# Patient Record
Sex: Female | Born: 1950 | Race: White | Hispanic: No | Smoking: Never smoker
Health system: Southern US, Community
[De-identification: ages and names within clinical notes are randomized; demographics above are authoritative.]

## PROBLEM LIST (undated history)

## (undated) DIAGNOSIS — I509 Heart failure, unspecified: Secondary | ICD-10-CM

## (undated) DIAGNOSIS — C189 Malignant neoplasm of colon, unspecified: Secondary | ICD-10-CM

## (undated) DIAGNOSIS — R079 Chest pain, unspecified: Secondary | ICD-10-CM

## (undated) DIAGNOSIS — I639 Cerebral infarction, unspecified: Secondary | ICD-10-CM

## (undated) DIAGNOSIS — D3A00 Benign carcinoid tumor of unspecified site: Secondary | ICD-10-CM

## (undated) DIAGNOSIS — I219 Acute myocardial infarction, unspecified: Secondary | ICD-10-CM

## (undated) DIAGNOSIS — I2510 Atherosclerotic heart disease of native coronary artery without angina pectoris: Secondary | ICD-10-CM

## (undated) DIAGNOSIS — I4891 Unspecified atrial fibrillation: Secondary | ICD-10-CM

## (undated) DIAGNOSIS — I10 Essential (primary) hypertension: Secondary | ICD-10-CM

## (undated) DIAGNOSIS — E119 Type 2 diabetes mellitus without complications: Secondary | ICD-10-CM

## (undated) DIAGNOSIS — J811 Chronic pulmonary edema: Secondary | ICD-10-CM

## (undated) HISTORY — PX: CHOLECYSTECTOMY: SHX55

## (undated) HISTORY — DX: Malignant neoplasm of colon, unspecified: C18.9

## (undated) HISTORY — DX: Benign carcinoid tumor of unspecified site: D3A.00

## (undated) HISTORY — DX: Unspecified atrial fibrillation: I48.91

## (undated) HISTORY — DX: Heart failure, unspecified: I50.9

## (undated) HISTORY — DX: Atherosclerotic heart disease of native coronary artery without angina pectoris: I25.10

## (undated) HISTORY — DX: Chronic pulmonary edema: J81.1

## (undated) HISTORY — DX: Acute myocardial infarction, unspecified: I21.9

## (undated) HISTORY — DX: Cerebral infarction, unspecified: I63.9

## (undated) HISTORY — DX: Essential (primary) hypertension: I10

## (undated) HISTORY — DX: Type 2 diabetes mellitus without complications: E11.9

## (undated) HISTORY — DX: Chest pain, unspecified: R07.9

## (undated) HISTORY — PX: TUBAL LIGATION: SHX77

## (undated) HISTORY — PX: SHOULDER SURGERY: SHX246

---

## 2006-08-07 DIAGNOSIS — C189 Malignant neoplasm of colon, unspecified: Secondary | ICD-10-CM

## 2006-08-07 HISTORY — DX: Malignant neoplasm of colon, unspecified: C18.9

## 2008-08-07 DIAGNOSIS — I639 Cerebral infarction, unspecified: Secondary | ICD-10-CM

## 2008-08-07 HISTORY — DX: Cerebral infarction, unspecified: I63.9

## 2008-08-07 HISTORY — PX: CARDIAC CATHETERIZATION: SHX172

## 2008-08-09 ENCOUNTER — Ambulatory Visit: Payer: Self-pay | Admitting: Cardiology

## 2008-08-13 ENCOUNTER — Inpatient Hospital Stay (HOSPITAL_BASED_OUTPATIENT_CLINIC_OR_DEPARTMENT_OTHER): Admission: RE | Admit: 2008-08-13 | Discharge: 2008-08-13 | Payer: Self-pay | Admitting: Cardiology

## 2008-08-13 ENCOUNTER — Ambulatory Visit: Payer: Self-pay | Admitting: Cardiology

## 2008-09-14 ENCOUNTER — Ambulatory Visit: Payer: Self-pay | Admitting: Cardiology

## 2008-10-22 ENCOUNTER — Encounter: Payer: Self-pay | Admitting: Cardiovascular Disease

## 2008-10-22 ENCOUNTER — Ambulatory Visit: Payer: Self-pay | Admitting: Cardiology

## 2008-10-23 ENCOUNTER — Encounter: Payer: Self-pay | Admitting: Cardiovascular Disease

## 2008-10-28 ENCOUNTER — Ambulatory Visit: Payer: Self-pay | Admitting: Physical Medicine & Rehabilitation

## 2008-10-28 ENCOUNTER — Inpatient Hospital Stay (HOSPITAL_COMMUNITY)
Admission: RE | Admit: 2008-10-28 | Discharge: 2008-11-18 | Payer: Self-pay | Admitting: Physical Medicine & Rehabilitation

## 2008-12-18 ENCOUNTER — Encounter
Admission: RE | Admit: 2008-12-18 | Discharge: 2009-03-18 | Payer: Self-pay | Admitting: Physical Medicine & Rehabilitation

## 2008-12-22 ENCOUNTER — Ambulatory Visit: Payer: Self-pay | Admitting: Physical Medicine & Rehabilitation

## 2009-01-25 ENCOUNTER — Ambulatory Visit: Payer: Self-pay | Admitting: Physical Medicine & Rehabilitation

## 2009-02-02 ENCOUNTER — Ambulatory Visit: Payer: Self-pay | Admitting: Cardiology

## 2009-02-16 ENCOUNTER — Ambulatory Visit: Payer: Self-pay | Admitting: Physical Medicine & Rehabilitation

## 2009-03-01 ENCOUNTER — Ambulatory Visit: Payer: Self-pay | Admitting: Cardiology

## 2009-03-09 ENCOUNTER — Encounter: Payer: Self-pay | Admitting: Cardiology

## 2009-03-12 ENCOUNTER — Encounter: Payer: Self-pay | Admitting: Cardiology

## 2009-03-25 ENCOUNTER — Encounter
Admission: RE | Admit: 2009-03-25 | Discharge: 2009-06-23 | Payer: Self-pay | Admitting: Physical Medicine & Rehabilitation

## 2009-03-30 ENCOUNTER — Ambulatory Visit: Payer: Self-pay | Admitting: Physical Medicine & Rehabilitation

## 2009-04-19 ENCOUNTER — Ambulatory Visit: Payer: Self-pay | Admitting: Physical Medicine & Rehabilitation

## 2009-04-30 DIAGNOSIS — E785 Hyperlipidemia, unspecified: Secondary | ICD-10-CM | POA: Insufficient documentation

## 2009-04-30 DIAGNOSIS — R0989 Other specified symptoms and signs involving the circulatory and respiratory systems: Secondary | ICD-10-CM | POA: Insufficient documentation

## 2009-04-30 DIAGNOSIS — I2510 Atherosclerotic heart disease of native coronary artery without angina pectoris: Secondary | ICD-10-CM | POA: Insufficient documentation

## 2009-04-30 DIAGNOSIS — I10 Essential (primary) hypertension: Secondary | ICD-10-CM | POA: Insufficient documentation

## 2009-04-30 DIAGNOSIS — E663 Overweight: Secondary | ICD-10-CM | POA: Insufficient documentation

## 2009-05-17 ENCOUNTER — Ambulatory Visit: Payer: Self-pay | Admitting: Physical Medicine & Rehabilitation

## 2009-06-14 ENCOUNTER — Ambulatory Visit: Payer: Self-pay | Admitting: Physical Medicine & Rehabilitation

## 2009-07-09 ENCOUNTER — Encounter
Admission: RE | Admit: 2009-07-09 | Discharge: 2009-07-29 | Payer: Self-pay | Admitting: Physical Medicine & Rehabilitation

## 2009-07-12 ENCOUNTER — Ambulatory Visit: Payer: Self-pay | Admitting: Physical Medicine & Rehabilitation

## 2009-09-08 ENCOUNTER — Encounter
Admission: RE | Admit: 2009-09-08 | Discharge: 2009-12-07 | Payer: Self-pay | Admitting: Physical Medicine & Rehabilitation

## 2009-09-10 ENCOUNTER — Ambulatory Visit: Payer: Self-pay | Admitting: Physical Medicine & Rehabilitation

## 2009-10-04 ENCOUNTER — Ambulatory Visit: Payer: Self-pay | Admitting: Physical Medicine & Rehabilitation

## 2009-11-02 ENCOUNTER — Ambulatory Visit: Payer: Self-pay | Admitting: Physical Medicine & Rehabilitation

## 2009-12-23 ENCOUNTER — Encounter
Admission: RE | Admit: 2009-12-23 | Discharge: 2010-03-23 | Payer: Self-pay | Admitting: Physical Medicine & Rehabilitation

## 2009-12-28 ENCOUNTER — Ambulatory Visit: Payer: Self-pay | Admitting: Physical Medicine & Rehabilitation

## 2010-01-04 ENCOUNTER — Ambulatory Visit: Payer: Self-pay | Admitting: Physical Medicine & Rehabilitation

## 2010-01-27 ENCOUNTER — Ambulatory Visit: Payer: Self-pay | Admitting: Physical Medicine & Rehabilitation

## 2010-03-01 ENCOUNTER — Ambulatory Visit: Payer: Self-pay | Admitting: Physical Medicine & Rehabilitation

## 2010-04-12 ENCOUNTER — Encounter
Admission: RE | Admit: 2010-04-12 | Discharge: 2010-07-11 | Payer: Self-pay | Source: Home / Self Care | Admitting: Physical Medicine & Rehabilitation

## 2010-04-15 ENCOUNTER — Ambulatory Visit: Payer: Self-pay | Admitting: Physical Medicine & Rehabilitation

## 2010-06-10 ENCOUNTER — Ambulatory Visit: Payer: Self-pay | Admitting: Physical Medicine & Rehabilitation

## 2010-06-28 ENCOUNTER — Ambulatory Visit: Payer: Self-pay | Admitting: Physical Medicine & Rehabilitation

## 2010-10-25 ENCOUNTER — Ambulatory Visit: Payer: Self-pay | Admitting: Physical Medicine & Rehabilitation

## 2010-11-16 LAB — GLUCOSE, CAPILLARY
Glucose-Capillary: 102 mg/dL — ABNORMAL HIGH (ref 70–99)
Glucose-Capillary: 109 mg/dL — ABNORMAL HIGH (ref 70–99)
Glucose-Capillary: 109 mg/dL — ABNORMAL HIGH (ref 70–99)
Glucose-Capillary: 115 mg/dL — ABNORMAL HIGH (ref 70–99)
Glucose-Capillary: 120 mg/dL — ABNORMAL HIGH (ref 70–99)
Glucose-Capillary: 124 mg/dL — ABNORMAL HIGH (ref 70–99)
Glucose-Capillary: 127 mg/dL — ABNORMAL HIGH (ref 70–99)
Glucose-Capillary: 129 mg/dL — ABNORMAL HIGH (ref 70–99)
Glucose-Capillary: 132 mg/dL — ABNORMAL HIGH (ref 70–99)
Glucose-Capillary: 134 mg/dL — ABNORMAL HIGH (ref 70–99)
Glucose-Capillary: 137 mg/dL — ABNORMAL HIGH (ref 70–99)
Glucose-Capillary: 137 mg/dL — ABNORMAL HIGH (ref 70–99)
Glucose-Capillary: 142 mg/dL — ABNORMAL HIGH (ref 70–99)
Glucose-Capillary: 143 mg/dL — ABNORMAL HIGH (ref 70–99)
Glucose-Capillary: 146 mg/dL — ABNORMAL HIGH (ref 70–99)
Glucose-Capillary: 154 mg/dL — ABNORMAL HIGH (ref 70–99)
Glucose-Capillary: 159 mg/dL — ABNORMAL HIGH (ref 70–99)
Glucose-Capillary: 161 mg/dL — ABNORMAL HIGH (ref 70–99)
Glucose-Capillary: 163 mg/dL — ABNORMAL HIGH (ref 70–99)
Glucose-Capillary: 164 mg/dL — ABNORMAL HIGH (ref 70–99)
Glucose-Capillary: 172 mg/dL — ABNORMAL HIGH (ref 70–99)
Glucose-Capillary: 194 mg/dL — ABNORMAL HIGH (ref 70–99)
Glucose-Capillary: 211 mg/dL — ABNORMAL HIGH (ref 70–99)
Glucose-Capillary: 226 mg/dL — ABNORMAL HIGH (ref 70–99)
Glucose-Capillary: 235 mg/dL — ABNORMAL HIGH (ref 70–99)
Glucose-Capillary: 60 mg/dL — ABNORMAL LOW (ref 70–99)
Glucose-Capillary: 68 mg/dL — ABNORMAL LOW (ref 70–99)
Glucose-Capillary: 77 mg/dL (ref 70–99)
Glucose-Capillary: 79 mg/dL (ref 70–99)
Glucose-Capillary: 87 mg/dL (ref 70–99)

## 2010-11-16 LAB — BASIC METABOLIC PANEL
BUN: 15 mg/dL (ref 6–23)
BUN: 23 mg/dL (ref 6–23)
Calcium: 8.8 mg/dL (ref 8.4–10.5)
Chloride: 104 mEq/L (ref 96–112)
Creatinine, Ser: 1.24 mg/dL — ABNORMAL HIGH (ref 0.4–1.2)
GFR calc non Af Amer: 45 mL/min — ABNORMAL LOW (ref >60)
Glucose, Bld: 151 mg/dL — ABNORMAL HIGH (ref 70–99)
Glucose, Bld: 69 mg/dL — ABNORMAL LOW (ref 70–99)
Potassium: 4.3 mEq/L (ref 3.5–5.1)
Potassium: 4.5 mEq/L (ref 3.5–5.1)

## 2010-11-16 LAB — URINE CULTURE: Colony Count: >=100000

## 2010-11-16 LAB — URINALYSIS, ROUTINE W REFLEX MICROSCOPIC
Ketones, ur: 15 mg/dL — AB
Nitrite: POSITIVE — AB
Protein, ur: 30 mg/dL — AB
Urobilinogen, UA: 0.2 mg/dL (ref 0.0–1.0)

## 2010-11-16 LAB — CLOSTRIDIUM DIFFICILE EIA: C difficile Toxins A+B, EIA: NEGATIVE

## 2010-11-17 LAB — CBC
HCT: 34.8 % — ABNORMAL LOW (ref 36.0–46.0)
MCV: 79.1 fL (ref 78.0–100.0)
RBC: 4.4 MIL/uL (ref 3.87–5.11)
WBC: 5.2 10*3/uL (ref 4.0–10.5)

## 2010-11-17 LAB — COMPREHENSIVE METABOLIC PANEL
AST: 30 U/L (ref 0–37)
BUN: 20 mg/dL (ref 6–23)
CO2: 24 mEq/L (ref 19–32)
Chloride: 101 mEq/L (ref 96–112)
Creatinine, Ser: 0.98 mg/dL (ref 0.4–1.2)
GFR calc Af Amer: >60 mL/min (ref >60)
GFR calc non Af Amer: 59 mL/min — ABNORMAL LOW (ref >60)
Total Bilirubin: 1.1 mg/dL (ref 0.3–1.2)

## 2010-11-17 LAB — DIFFERENTIAL
Basophils Absolute: 0 10*3/uL (ref 0.0–0.1)
Basophils Relative: 0 % (ref 0–1)
Eosinophils Relative: 4 % (ref 0–5)
Lymphocytes Relative: 18 % (ref 12–46)

## 2010-11-17 LAB — GLUCOSE, CAPILLARY
Glucose-Capillary: 157 mg/dL — ABNORMAL HIGH (ref 70–99)
Glucose-Capillary: 192 mg/dL — ABNORMAL HIGH (ref 70–99)
Glucose-Capillary: 208 mg/dL — ABNORMAL HIGH (ref 70–99)
Glucose-Capillary: 217 mg/dL — ABNORMAL HIGH (ref 70–99)
Glucose-Capillary: 219 mg/dL — ABNORMAL HIGH (ref 70–99)
Glucose-Capillary: 227 mg/dL — ABNORMAL HIGH (ref 70–99)
Glucose-Capillary: 227 mg/dL — ABNORMAL HIGH (ref 70–99)
Glucose-Capillary: 228 mg/dL — ABNORMAL HIGH (ref 70–99)
Glucose-Capillary: 241 mg/dL — ABNORMAL HIGH (ref 70–99)
Glucose-Capillary: 244 mg/dL — ABNORMAL HIGH (ref 70–99)
Glucose-Capillary: 245 mg/dL — ABNORMAL HIGH (ref 70–99)
Glucose-Capillary: 259 mg/dL — ABNORMAL HIGH (ref 70–99)
Glucose-Capillary: 267 mg/dL — ABNORMAL HIGH (ref 70–99)
Glucose-Capillary: 309 mg/dL — ABNORMAL HIGH (ref 70–99)

## 2010-11-17 LAB — URINE CULTURE: Special Requests: NEGATIVE

## 2010-11-17 LAB — URINALYSIS, ROUTINE W REFLEX MICROSCOPIC
Hgb urine dipstick: NEGATIVE
Specific Gravity, Urine: 1.025 (ref 1.005–1.030)
Urobilinogen, UA: 0.2 mg/dL (ref 0.0–1.0)

## 2010-11-17 LAB — URINE MICROSCOPIC-ADD ON

## 2010-11-21 LAB — POCT I-STAT GLUCOSE
Glucose, Bld: 166 mg/dL — ABNORMAL HIGH (ref 70–99)
Operator id: 141321

## 2010-11-21 LAB — BASIC METABOLIC PANEL
GFR calc Af Amer: >60 mL/min (ref >60)
GFR calc non Af Amer: 55 mL/min — ABNORMAL LOW (ref >60)
Potassium: 4.2 mEq/L (ref 3.5–5.1)
Sodium: 139 mEq/L (ref 135–145)

## 2010-11-21 LAB — PROTIME-INR: Prothrombin Time: 14.1 seconds (ref 11.6–15.2)

## 2010-11-22 ENCOUNTER — Ambulatory Visit: Payer: Self-pay | Admitting: Physical Medicine & Rehabilitation

## 2010-11-25 ENCOUNTER — Encounter: Payer: Self-pay | Attending: Physical Medicine & Rehabilitation

## 2010-11-25 ENCOUNTER — Ambulatory Visit (HOSPITAL_BASED_OUTPATIENT_CLINIC_OR_DEPARTMENT_OTHER): Payer: Self-pay | Admitting: Physical Medicine & Rehabilitation

## 2010-11-25 DIAGNOSIS — Z79899 Other long term (current) drug therapy: Secondary | ICD-10-CM | POA: Insufficient documentation

## 2010-11-25 DIAGNOSIS — G8110 Spastic hemiplegia affecting unspecified side: Secondary | ICD-10-CM

## 2010-11-25 DIAGNOSIS — I69959 Hemiplegia and hemiparesis following unspecified cerebrovascular disease affecting unspecified side: Secondary | ICD-10-CM | POA: Insufficient documentation

## 2010-11-26 NOTE — Assessment & Plan Note (Signed)
REASON FOR VISIT:  Right upper extremity spasticity.  [name] follows up today.  She had a right upper extremity Botox injection on June 11, 2010.  I last saw her on June 28, 2010.  She is on Zanaflex at night 12 mg and 2 mg twice a day during the daytime hours.  This is to avoid sedation.  She takes tramadol 50 mg at night.  She has her medications refilled and refills.  She has had no new medical problems.  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  She states that she will be on Medicare starting in October of this year.  She has had no other medical problems in the interval time.  REVIEW OF SYSTEMS:  Positive for low blood sugars as well as constipation.  PHYSICAL EXAMINATION:  VITAL SIGNS:  Blood pressure 179/50, pulse 65, respirations 18, and O2 sat 99% on room air. GENERAL:  No acute distress.  Mood and affect appropriate.  She continues to see Dr. Gerhard Munch, which is her primary care physician.  She is on Januvia, Plavix, Zocor, Nexium, Travatan eyedrops, lisinopril, and Lasix.  PHYSICAL EXAMINATION:  She has Ashworth grade 3 spasticity in the finger, wrist flexors on the right side.  She does have a hyperactive extensor hallucis brevis muscle on the left side as well.  She has 3- strength in the deltoid biceps, triceps, and grip.  This is tone influence with increased flexor tone in general in the upper extremity. Right lower extremity has a foot drop. It is correct with the AFO.  Her gait with the AFO shows no evidence of toe drag or knee instability. She does have a valgus deformity of the left knee, but no pain.  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  She would benefit from Botox injection as prior one has worn off.  However, she has no insurance at the current time.  We will need to continue the oral medications, Zanaflex.  Have been  recommended thumb spica splint on the right side.  I will see her back in 6 months when she has insurance and do Botox injection at that time.  I discussed with patient, agrees with plan.  I did offer to check for free samples on the Botox, but she states that even the injection would be costly for her.     Erick Colace, M.D. Electronically Signed    AEK/MedQ D:  11/25/2010 11:09:13  T:  11/25/2010 23:42:58  Job #:  [id number]

## 2010-12-20 NOTE — Cardiovascular Report (Signed)
NAMEALIVIANA, BURDELL NO.:  [id number]   MEDICAL RECORD NO.:  [id number]          PATIENT TYPE:  OIB   LOCATION:  1962                         FACILITY:  [facility]   PHYSICIAN:  Arturo Morton. Riley Kill, MD, FACCDATE OF BIRTH:  1951/05/20   DATE OF PROCEDURE:  08/13/2008  DATE OF DISCHARGE:  08/13/2008                            CARDIAC CATHETERIZATION   INDICATIONS:  Erica [name] is a delightful 60 year old lady who presents  with chest discomfort.  She initially had some nausea and vomiting and  then developed chest, upper arm, and jaw discomfort.  She was mildly  anemic.  A 2-D echocardiogram was done and demonstrated a small secundum  ASD and/or PFO, but Dr. Andee Lineman reviewed this study and did not feel that  the size of the right heart was enlarged and therefore right heart cath  was not recommended.  The patient underwent exercise stress testing.  There is a hypertensive blood pressure response with no chest pain and  no clear diagnostic ST-segment abnormalities.  The patient was sent down  for a diagnostic catheterization.  She did have mild elevation in her  creatinine initially, but today, the creatinine is 1.04.  On  radionuclide imaging, she had a partially reversible anterior defect  suggestive of LAD disease.  A cardiac cath was then recommended.   PROCEDURES:  1. Left heart catheterization.  2. Selective coronary arteriography.   DESCRIPTION OF THE PROCEDURE:  Through an anterior puncture, the right  femoral artery was easily entered.  A 4-French sheath was placed  following informed consent.  The 4-French diagnostic catheters were  utilized to opacify the left and right coronary arteries.  We then  performed standard coronary arteriography with limited contrast.  Ventriculography was not performed limit contrast load given her mild  creatinine elevation.   The procedure was completed without complication.  She was taken to the  holding area in satisfactory  clinical condition.   HEMODYNAMIC DATA:  The central aortic pressure was 143/63.  The mean of  94.  The left ventricular pressure 144/21.  There was no gradient  pullback across the aortic valve.   ANGIOGRAPHIC DATA:  1. The left main was free of critical disease.  2. The LAD demonstrates calcification proximally after the diagonal,      and then segmental plaque throughout the proximal LAD of      approximately 60%.  The vessel then opens up and then demonstrates      a bit more disease and it has the appearance of a typical diabetic      vessel being diffusely diseased and plaque distally.  The true      large caliber the vessel is never clearly discerned.  Likewise, the      first diagonal is a modestly large vessel supplying the      anterolateral segment.  It is segmentally diseased of about 70%      proximally.  This looks like the most severe disease clearly      compared to the LAD.  It also has a pruned appearance.  3. The circumflex  vessel is a large-caliber vessel.  It demonstrates      mild luminal irregularities with 20 and 30% areas of plaquing      throughout the proximal vessel.  Very distally, the vessel is      pruned and there is clearly plaque as it bifurcates distally of      about 50%.  None of this appears to be critical.  4. The right coronary artery demonstrates mild luminal irregularity      with about 20-30% mid narrowing.  The PDA and posterolateral      branches are intact.   CONCLUSIONS:  1. 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  2. Moderate disease of the mid LAD.  3. Segmental disease of the diagonal proximally over a fairly long      distance with pruning of the vessels distally.   DISPOSITION:  At the present time, the 2 options would likely be either  medical therapy or percutaneous intervention.  The vessels themselves  are diffusely diseased and not critical, and leaning would be in the  direction of medical therapy.  I  will review the films with my  colleagues before making a final decision.      Arturo Morton. Riley Kill, MD, [facility]  Electronically Signed     TDS/MEDQ  D:  08/13/2008  T:  08/14/2008  Job:  [id number]   cc:   Learta Codding, MD,FACC  [facility]

## 2010-12-20 NOTE — Assessment & Plan Note (Signed)
[facility]                          [facility] OFFICE NOTE   NAME:Erica [name], Erica [name]                        MRN:          [id number]  DATE:09/14/2008                            DOB:          1951-03-13    PRIMARY CARDIOLOGIST:  Learta Codding, MD, [facility]   REASON FOR VISIT:  Postcatheterization followup.   Erica [name] presents to our clinic after undergoing elective cardiac  catheterization on August 13, 2008, by Dr. Shawnie Pons.  This was  arranged after a recent, brief hospitalization here at [facility], at  which time she presented with new onset chest pain.  She had no prior  history of heart disease, but numerous cardiac risk factors, including  type 2 diabetes mellitus.  She tells me today that this was a singular  episode of chest pain, originating in the middle of her back and  traversing to the left side of her chest and up to the left side of her  jaw and neck.  Serial cardiac markers were all within normal limits.   An in-house evaluation with perfusion imaging, however, did suggest a  medium, partially reversible anterior defect.  Left ventricular function  was preserved both by stress testing, as well as by echocardiography.   Coronary angiography, however, suggested nonobstructive disease, but  with calcified proximal LAD disease of approximately 60%.  Insignificant  lesions were noted in both the CFX and RCA.  LV gram was deferred.  Dr.  Riley Kill recommended initial medical management.   Since that singular episode, Erica [name] has not had any recurrent angina  pectoris.  She is now 1 month out since undergoing cardiac  catheterization, and denies any complications of her right groin  incision site.  She also denies any interim development of exertional  chest discomfort or significant dyspnea.  Of note, she has never smoked  tobacco.  Her cardiac risk factors are notable for hypertension,  diabetes mellitus, dyslipidemia, and age.   CURRENT MEDICATIONS:  1. Novolin 35 units q.a.m./25 units q.p.m.  2. Regular insulin 10 units q.a.m./5 units q.p.m.  3. Azor 5/20.  4. Prilosec.  5. Plavix (currently out).  6. Imdur 30 daily. (currently out).  7. Aspirin 325 daily.  8. Zocor 40 daily.  9. Ditropan 5 nightly.   PHYSICAL EXAMINATION:  VITAL SIGNS:  Blood pressure 153/81, pulse 77,  regular weight 283.  GENERAL:  A 60 year old female, morbidly obese, sitting upright, no  distress.  HEENT:  Normocephalic and atraumatic.  NECK:  Palpable carotid pulse without bruits.  Unable to assess JVD  secondary to neck girth.  LUNGS:  Clear to auscultation in all fields.  HEART:  Regular rate and rhythm.  No rubs, murmurs, or gallops.  ABDOMEN:  Protuberant and nontender.  EXTREMITIES:  No significant edema.  NEUROLOGIC:  No focal deficit.   IMPRESSION:  1. Single-vessel coronary artery disease.      a.     Moderate, nonobstructive LAD disease.  2. Preserved LVF.  3. Type 2 diabetes mellitus.  4. Hypertension.  5. Dyslipidemia.  6. Obesity.  7. Status post mild  renal insufficiency.   PLAN:  1. With respect to medications, Erica [name] would prefer to not have her      prescription renewed for either Plavix or Imdur.  Both of these      medications were new.  Given that she did not have any abnormal      troponins, and continues to report no recurrent angina pectoris, I      concur that at this point in time she does not need to be on these      medications.  I did, however, point out the benefit of having long-      acting nitrates on board, given her underlying coronary anatomy.      She would prefer, however, to consider resuming Imdur only if she      were to have recurrent angina pectoris.  She does have p.r.n.      nitroglycerin with her.  2. Decrease aspirin to 81 mg daily.  As noted, the patient does not      need to be on Plavix.  3. Aggressive lipid management is advised with target LDL 70 or less,      if  feasible.  Her recent lipid profile indicated an LDL of 86.  We      will defer to Dr. Gerhard Munch as to whether or not she is a good      candidate to either have her Zocor increased to 80 mg daily, or to      consider a more potent agent.  4. I will schedule return clinic followup with myself and Dr. Andee Lineman      in 4 months, or sooner as needed.      Gene Serpe, PA-C  Electronically Signed      Learta Codding, MD,FACC  Electronically Signed   GS/MedQ  DD: 09/14/2008  DT: 09/15/2008  Job #: [id number]   cc:   Linward Foster

## 2010-12-20 NOTE — Assessment & Plan Note (Signed)
[name] is a 60 year old female with a history of CVA, onset October 22, 2008.  MRI showed an acute left hemispheric infarct affecting the basal  ganglia, internal capsule, and periventricular white matter.  She has  been on Plavix for CVA prophylaxis.   She has a history of morbid obesity with a BMI of 49.  I had been  following her since she was discharged from rehab on November 18, 2008.  Major issues have included spasticity affecting both the lower extremity  and the upper extremity.  The upper extremity is in fact getting worse  since I last saw her.  She has had a tibial nerve block on the right  side with phenol neurolysis.  This has resulted in the ability to get up  and go to the bathroom without putting her AFO on at night.  She has had  no new medical problems.  She is doing some stretching at home.  She was  asking for other exercises to stretch her wrist and her lower extremity.  I have instructed her in 3, which include putting weight through a  dorsiflexed wrist, doing quad stretches, and doing some Achilles  stretches with metatarsals up on a wood block.   She has had problems turning over her hand with palm up position, also  problems with dorsiflexing wrist.  She is getting some finger flexion,  as well as finger extension.  She still needs help with dressing,  bathing, toileting, household duties, and shopping.   Her review of systems positive for bladder control problems, stroke,  trouble with walking, and spasms.   Her blood pressure today is 173/52, but states that she just walked in  from the parking lot for the first time and usually uses a wheelchair.  Her pulse is 59, respirations 16, O2 sat 98% on room air.   She is obese female in no acute stress, orientation x3.  Affect is  alert.  Speech is without dysarthria.  She uses a Comptroller.  Her left-  sided strength is 5/5.  Her right upper extremity strength is 2- at the  deltoid, 2- at the biceps, triceps, 3-  in the finger flexors, extensors.  She has great height pronators, which are palpable and result in  inability to supinate the forearm.  In addition in lower extremities,  she has ankle contracture and clonus at the ankle, although her range of  motion has improved to neutral.  She has tight quads, hyperactive knee  reflex.  Her lower extremity strength is 3- in the hip flexors, knee  extensors, 0-5 at the ankle.   IMPRESSION:  Right spastic hemiplegia due to left cerebrovascular  accident.   PLAN:  1. We will continue Zanaflex.  She will take 8 mg at night.  If she      wakes up with spasms, she will take an additional 2 mg and then she      will take another 2 mg when she first wakes up in the morning.  2. I have instructed the patient additional PT exercises.  She already      has her home exercise program, which she does in conjunction with      her husband.  She also does her OT exercises.  3. Botulinum toxin injection, right.  Pronator teres would use about      50 units with use of an additional 50 in the flexor carpi radialis      muscles.   I  will see her back for the injection.   I encouraged her on her home exercise program quite compliant in regards  to this.  In regards to phenol reinjection, will not need this for  probably another 2-4 months.      Erick Colace, M.D.  Electronically Signed     AEK/MedQ  D:  03/30/2009 13:57:15  T:  03/31/2009 06:51:01  Job #:  [id number]   cc:   Dr. Quintin Alto

## 2010-12-20 NOTE — Assessment & Plan Note (Signed)
[name] is a 60 year old female, who was recently discharged from  [facility] on October 28, 2008, through November 18, 2008,  for left basal ganglia internal capsule infarct.  She has returned to  home and followed up with me approximately 1 month ago.  She has been  getting outpatient therapy in [place] which is closer to home.  Her  main complaint at this time is right lower extremity spasms.  She gets  some relief with Zanaflex at night.  She also gets relief from  stretching her ankle in a dorsiflexed position.  Her upper extremity  swelling is really not causing any pain.  She has had no spasms in the  right upper extremity.  She has continued to advance her ambulation.  In  last visit, she did about 1-2 minutes at a time, now she is up to 5-10  minutes at a time.  She uses a wheelchair for further distances.  She  still requires assistance with dressing, bathing, toileting, meal prep,  household duties, and shopping.  She cannot don or doff her AFO.  She  has some pain on the lateral aspect of her AFO just where it flares out  at the base of the fifth metatarsal.   REVIEW OF SYSTEMS:  Positive for trouble walking and spasms as well as  easy bleeding, night sweats as noted above.   She is on Plavix.  Her other past medical history significant for  diabetes and hypertension.  She follows with her primary care physician,  Dr. Gaynelle Cage.   PHYSICAL EXAMINATION:  GENERAL:  An obese female in no acute distress.  NEUROLOGIC:  Orientation x3.  Affect is bright and alert.  MUSCULOSKELETAL:  She walks with a limp, she uses AFO.  She is unable to  ambulate without the AFO because of hyperextension as well as ankle  equinovarus positioning.  She has no clonus at the ankle, however, she  has increased tone with ankle partial contracture.  Her upper extremity  strength is 3- at the biceps and deltoid, otherwise 2- at the finger, 0  in the wrist extensors and finger extensors,  0 at the elbow extensor.  No lower extremity.  She has 3- hip flexor, knee extensor and 0 at the  ankle dorsiflexor.  She has a hyperactive Babinski reflex.   IMPRESSION:  1. Right spastic hemiplegia due to left subcortical infarct.  2. Right ankle contracture, partial.   RECOMMENDATIONS:  1. Given that she has painful spasms unrelieved by maximum doses of      Zanaflex, we will do phenol nerve block to right lower extremity.      Failing this, we may consider Botox.  2. Continue physical therapy as ordered as well as OT.   I will see her back in 2-3 weeks for the injection.      Erick Colace, M.D.  Electronically Signed     AEK/MedQ  D:  01/25/2009 16:07:42  T:  01/26/2009 04:40:04  Job #:  [id number]   cc:   Linward Foster  Fax: [phone]

## 2010-12-20 NOTE — H&P (Signed)
Erica [name], Erica [name] NO.:  [id number]   MEDICAL RECORD NO.:  [id number]          PATIENT TYPE:  IPS   LOCATION:  4037                         FACILITY:  [facility]   PHYSICIAN:  Ranelle Oyster, M.D.DATE OF BIRTH:  [birth year]   DATE OF ADMISSION:  10/28/2008  DATE OF DISCHARGE:                              HISTORY & PHYSICAL   HISTORY OF PRESENT ILLNESS:  This is a 60 year old white female with  diabetes type 2 and morbid obesity who was admitted to [facility]  on March 18, with right lower extremity weakness and numbness and  difficulty walking.  MRI of the brain initially was negative.  On March  19, the patient developed increased weakness.  MRI of the brain showed  acute left hemispheric infarct affecting the basal ganglia and internal  capsule and periventricular white matter.  Carotid Dopplers with near  50% proximal right ICA occlusion.  Neurology was consulted and placed  the patient on Plavix for stroke prophylaxis.  The patient also  developed diarrhea, positive for C. diff.  The patient was started on  Flagyl, approximately March 23.  Culture was positive for 100,000 gram-  negative rods and Levaquin was added for treatment.  The patient  continues to struggle with mobility and self-care due to dense  hemiparesis and rehab was asked to evaluate the patient and felt that  she could benefit from an inpatient setting.  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  A 2-D echo showed  ejection fraction of 55-60% with mild LVH.   PAST MEDICAL HISTORY:  Positive for morbid obesity with BMI of 49, GERD,  hypertension, myalgia chest pain, insomnia, urinary incontinence,  congenital non-developed left kidney, history of renal calculi x2, with  status post lithotripsy, history of left ankle fracture x3 are right  ankle fracture x1.   FAMILY HISTORY:  Positive for CAD.   SOCIAL HISTORY:  The patient works as a Catering manager.   She is independent  prior to arrival.  She has a 1-level house with one plus one steps to  enter.  She does not smoke or drink.   ALLERGIES:  None.   HOME MEDICATIONS:  Ditropan, Januvia, Flexeril, aspirin, Prilosec,  Ativan, Azor, NPH insulin, and regular insulin twice a day.   LABORATORY DATA:  Hemoglobin 12.6, white count 5.1, platelets 157.  Sodium 133, potassium 4.1, BUN 18, creatinine 1.   PHYSICAL EXAMINATION:  VITAL SIGNS:  Blood pressure is 130/74, pulse is  80, respiratory rate is 16.  GENERAL:  The patient is afebrile.  Overall, the patient is obese,  pleasant, alert, and oriented x3.  HEENT:  Pupils are equal, round, and reactive to light.  Ears, nose, and  throat exam essentially unremarkable except for missing teeth/dentures.  She has a mild white coating over the tongue.  NECK:  Supple without JVD or lymphadenopathy.  CHEST:  Clear to auscultation bilaterally without wheezes, rales, or  rhonchi.  HEART:  Regular rate and rhythm without murmur, rubs, or gallops.  ABDOMEN:  Soft, nontender.  Bowel sounds are positive.  SKIN:  Generally intact except for a few abrasions noted.  NEUROLOGIC:  Cranial nerves II through XII showed a mild right central  VII and minimal tongue deviation.  Visual fields are intact.  No sensory  changes were seen on the face, arm, or leg.  Strength in the right upper  extremity was 0/5.  She is 5/5 in the left upper extremity.  Left lower  extremity, she is 4/5 proximal to 5/5 distally.  Right lower extremity,  she is 1/5 at the hip, 1+/5 at the knee, 1+/5 ankle plantar flexion,  trace to zero ankle dorsiflexion today.  Reflexes generally 1+  throughout.  Cognitively, she is within normal limits.  She has normal  language.  Memory and mood were all appropriate.   POST ADMISSION AND PHYSICIAN EVALUATION:  1. Functional deficit secondary to left hemispheric infarct affecting      basal ganglia, internal capsule, and periventricular white  matter.      Cervical stroke with likely small vessel related to her      hypertensive disease and diabetes.  2. The patient is admitted to receive collaborative interdisciplinary      care between physiatrist, rehab nursing staff, and therapy team.  3. The patient's level of medical complexity and substantial therapy      needs in context of that medical necessity cannot be provided at a      lesser intensity care.  4. The patient has experienced substantial functional loss from her      baseline, upon functional assessment at the time of preadmission      screening, which was within the last 24 hours.  The patient is mod      assist plus to sit to stand.  She is able to sit edge of bed, mod      assist.  She is max assist with basic self-care.  The patient was      independent driving prior to arrival.  Judging by the patient's      diagnosis, physical exam and functional history, the patient is      potential for functional progress, which will result in measurable      gains while inpatient rehab.  These gains will be of substantial      practical use upon discharge to home in facilitating mobility and      self-care.  5. Physiatrist will provide 24-hour management of medical needs as      well as oversight of therapy plans/treatment and provide guidance      as appropriate regarding interaction of the two.  Medical problem      list and plan are listed below.  6. A 24-hour rehab nursing will assist in management of the patient's      nutrition needs as well as skin care, bowel and bladder management,      medication administration, pain management, integration of therapy      concepts and techniques, etc.  7. PT will assess and treat for lower extremity strength, range of      motion, functional mobility, gait, adaptive equipment,      neuromuscular reeducation of appropriate.  The patient and family      education with goals min assist level likely at a wheelchair level.  8. OT  will assess and treat for upper extremity use and ADLs, adaptive      techniques, and equipment, neuromuscular reeducation, and cognitive      perceptual training and goals overall are min  to occasional mod      assist.  9. We will have speech language pathology assess for cognitive screen.  10.Case management/social worker will assess and treat for      psychosocial issues and discharge planning.  11.A team conference will be held weekly to assess progress towards      goals and to determine barriers to discharge.  12.The patient has demonstrated sufficient medical stability and      exercise capacity to tolerate at least 3 hours of therapy per day      at least 5 days per week.  13.Estimated length of stay is 3-4 weeks.  Prognosis is fair to good.   MEDICAL PROBLEM LIST AND PLAN:  1. Stroke prophylaxis with Plavix:  Consider a Neurology consult to      further assess cerebral disease and source of stroke.  2. Diabetes type 2:  Continue Januvia 50 mg p.o. daily.  The patient      on insulin NPH 35 units q.a.m. and 25 units q.p.m. starting      tomorrow.  3. Urinary tract infection:  Continue Levaquin 250 mg p.o. daily for 5      days more.  4. Clostridium difficile:  Continue 500 mg Flagyl q.6 h. Through March      30th.  We will follow up fluids, electrolytes, and skin status      closely.  5. Blood pressure control with Diovan as well as dietary modification.  6. Dyslipidemia:  Zocor.  7. Heel cord contracture:  Add right pressure relief AFO.      Ranelle Oyster, M.D.  Electronically Signed     ZTS/MEDQ  D:  10/28/2008  T:  10/29/2008  Job:  [id number]

## 2010-12-20 NOTE — Assessment & Plan Note (Signed)
[facility]                          [facility] OFFICE NOTE   NAME:Erica [name], Erica [name]                        MRN:          [id number]  DATE:02/02/2009                            DOB:          08-13-1950    REFERRING PHYSICIAN:  Linward Foster   The patient will be seeing Dr. Leandrew Koyanagi.   HISTORY OF PRESENT ILLNESS:  The patient is a 60 year old female who was  seen last by [place] in the office on September 14, 2008, after she underwent  cardiac catheterization for chest pain.  She was found to have single-  vessel coronary artery disease with 60% LAD lesion.  She otherwise has  no significant disease.  She had preserved LV function, type 2 diabetes  mellitus as well as hypertension and dyslipidemia.  The patient  unfortunately in April 2010, suffered a CVA.  She was found to have less  than 50% bilateral carotid disease, although she has a left carotid  bruit.  She had a left hemispheric CVA with right-sided paralysis from  which she is recovering.  She was placed on Plavix by the neurologist.  The patient has regained some function of her right arm and is walking  with a brace and a walker.  She denies currently any chest pain,  shortness of breath, orthopnea, or PND.  She has no palpitations or  syncope.  There is no prior history of atrial fibrillation.   MEDICATIONS:  1. Regular insulin at 10 units in a.m. and 5 units in the p.m.  2. Azor 5/20 mg p.o. daily.  3. Plavix 75 mg p.o. daily.  4. Zocor 40 mg p.o. q.p.m.  5. Novolin insulin 40 units in a.m. and 25 units in the p.m.  6. Folic acid.  7. Januvia 100 mg q.a.m.  8. Zanaflex 4 mg as directed.  9. Multivitamin.  10.Nexium 40 mg p.o. q.p.m.   PHYSICAL EXAMINATION:  VITAL SIGNS:  Blood pressure is 140/67, heart  rate is 59, and weight is 261 pounds.  NECK:  Normal carotid upstroke and left carotid bruit.  No thyromegaly.  Non-nodular thyroid.  LUNGS:  Clear breath sounds bilaterally.  HEART:   Regular rate and rhythm with normal S1 and S2.  No murmur, rubs,  or gallops.  ABDOMEN:  Soft, nontender.  No rebound or guarding.  Good bowel sounds.  EXTREMITIES:  No cyanosis, clubbing, or edema.  NEUROLOGIC:  The patient is alert and oriented.  Grossly nonfocal.   PROBLEM LIST:  1. Single-vessel coronary artery disease.      a.     Moderate nonobstructive left anterior descending artery       disease less than 50%.  2. Preserved left ventricular function.  3. Status post recent cerebrovascular accident.  See details above, on      Plavix.  4. Type 2 diabetes mellitus.  5. Hypertension.  6. Dyslipidemia.  7. Obesity.  8. Left carotid bruit, but less than 50% stenosis bilaterally of the      internal carotid arteries.   PLAN:  1. The patient likely has an embolic event from  the left carotid where      she has a bruit.  However, there appears to be no intervenable      disease.  2. Given the patient's risk factor profile, however, she also has had      risk for atrial fibrillation.  We will place a CardioNet monitor to      make sure that she does not have paroxysmal atrial fibrillation,      which could alter her therapy.  3. The patient will follow up with [place] in 6 months.     Learta Codding, MD,FACC  Electronically Signed    GED/MedQ  DD: 02/02/2009  DT: 02/03/2009  Job #: [id number]   cc:   Quintin Alto, MD

## 2010-12-20 NOTE — Procedures (Signed)
NAMEMASSIE, COGLIANO NO.:  [id number]   MEDICAL RECORD NO.:  [id number]           PATIENT TYPE:   LOCATION:                                 FACILITY:   PHYSICIAN:  Erick Colace, M.D.DATE OF BIRTH:  01-21-1951   DATE OF PROCEDURE:  DATE OF DISCHARGE:                               OPERATIVE REPORT   A 60 year old female, left basal ganglia stroke causing right spastic  hemiplegia, only partially as she has right lower extremity plantar  flexor spasticity and only partially relieved by Zanaflex and interferes  with mobility, has had extensive physical therapy as well.  Insurance is  no longer paying for this.   PROCEDURE:  Right tibial phenol neurolysis.   Informed consent was obtained after describing risks and benefits of the  procedure with the patient.  These include bleeding, bruising,  infection, dysesthesias.  She elects to proceed and has given written  consent.  The patient placed prone on exam table.  Area marked and E-  Stim using EMG stimulator applied.  Plantar flexion twitch obtained,  confirmed.  Betadine alcohol applied, entered with 22-gauge 50-mm needle  electrode with a IV extension tubing followed by E-Stim.  Plantar  flexion twitch obtained, confirmed at 0.7 mA, then a 5% phenol solution  x4 mL was injected after negative drawback for blood.  The patient  tolerated the procedure well.  Postprocedure instructions given.  No  immediate postprocedure problems.  I will see her back in approximately  6 weeks.  Instructed on proper stretching, both the patient and husband.      Erick Colace, M.D.  Electronically Signed     AEK/MEDQ  D:  02/16/2009 15:41:34  T:  02/17/2009 07:15:13  Job:  [id number]

## 2010-12-20 NOTE — Assessment & Plan Note (Signed)
Erica [name] is a 60 year old female with history of diabetes type 2,  morbid obesity, and found to have a left hemispheric infarct, affecting  basal ganglia, internal capsule, and periventricular white matter.  She  has a right hemiparesis.  She went through inpatient rehab on October 28, 2008 through November 18, 2008 at home, was getting home health therapy and  now getting outpatient therapy up in the [place].   He has had some swelling in the right upper extremity.  No seizures.  No  falls.  Some tightness in the right hand and some extensor spasm in the  right lower extremity, but this only lasts 30 seconds in the morning.   She can walk 1-2 minutes at times.  She does not climb steps.  She does  not drive.  She has 24-hour supervision from the family.  She gets  assistance with dressing, bathing, toileting, meal prep, household  duties, and shopping.   REVIEW OF SYSTEMS:  Positive for trouble walking, spasms, easy bleeding,  and blood sugar regulation problems.   PHYSICAL EXAMINATION:  VITAL SIGNS:  Blood pressure is 154/61, pulse 80,  respirations 18, and O2 sat 96% on room air.  GENERAL:  Well-developed and well-nourished female in no acute distress.  Orientation x3.  Affect is bright and alert.  She is with a limp.  EXTREMITIES:  Without edema.  She has 1+ edema of the dorsum of the  hand.  She has Ashworth grade 2 finger flexor spasticity.  She has  Ashworth grade 1 biceps and Ashworth grade 1 wrist flexor spasticity.  At her ankle, she has no evidence of clonus, but reduction in ankle  range of motion.  Gait shows some toe drag.  She does have an AFO.   IMPRESSION:  1. 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  2. Right ankle contracture.  3. Right hand swelling due to dependent position.  I do not think she      has a reflex sympathetic dystrophy.   PLAN:  1. We will continue Zanaflex 2 mg q.a.m. and q.6 p.m. and 8 mg at      bedtime.  2. We will have OT  fabricate resting hand splints to prevent      contracture.  3. Reassess in 1 month.  Possible need for Botox injection.      Erick Colace, M.D.  Electronically Signed     AEK/MedQ  D:  12/22/2008 15:43:52  T:  12/23/2008 04:45:52  Job #:  [id number]

## 2010-12-20 NOTE — Discharge Summary (Signed)
Erica [name], Erica [name] NO.:  [id number]   MEDICAL RECORD NO.:  [id number]          PATIENT TYPE:  IPS   LOCATION:  4037                         FACILITY:  [facility]   PHYSICIAN:  Erick Colace, M.D.DATE OF BIRTH:  [birth year]   DATE OF ADMISSION:  10/28/2008  DATE OF DISCHARGE:  11/18/2008                               DISCHARGE SUMMARY   DISCHARGE DIAGNOSES:  1. Left basal ganglia, internal capsule infarct.  2. Pseudomembranous colitis treated.  3. Urinary tract infection treated.  4. Diabetes mellitus type 2.  5. Flexor spasticity improved.   HISTORY OF PRESENT ILLNESS:  [name] is a 60 year old female with  history of diabetes mellitus type 2, myalgias, morbid obesity, admitted  to [facility] on March 18 with right upper extremity and right  lower extremity weakness and numbness and difficulty walking.  MRI of  brain initially negative on March 19, the patient with increase in  weakness, and MRI of brain showed acute left hemisphere infarct  affecting basal ganglia, internal capsule, and periventricular white  matter.  Carotid Dopplers done showed near 50% proximal right ICA  stenosis.  Neuro was consulted for input and the patient was placed on  Plavix for CVA prophylaxis.  She was also noted to have issues with  diarrhea and C. diff check was positive.  The patient started on Flagyl  for treatment.  The patient also noted to have a Gram-negative UTI  treated with Levaquin.  Therapies initiated and currently the patient  continues with right hemiparesis, noted to have improvement in sitting  balance at the edge of bed.  Rehab was asked to evaluate the patient and  felt that she would benefit from an inpatient rehab setting.   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   ALLERGIES:  No known drug allergies.   FAMILY HISTORY:  Positive for coronary artery disease.   SOCIAL HISTORY:  The patient works as a Conservator, museum/gallery.  Lives in one level  home with one step at entry, was independent prior to admission.  He  does not use any tobacco or alcohol.  She has a large supportive family  that can provide assist past discharge.   FUNCTIONAL HISTORY:  The patient was independent and driving prior to  admission.   FUNCTIONAL STATUS:  The patient is mod assist +2 to stand, able to sit  at edge of bed with mod-to-stand by assist with increase attempts.   PHYSICAL EXAMINATION:  VITAL SIGNS:  Blood pressure 130/74, pulse 80,  respiratory rate 18.  GENERAL:  The patient is afebrile, obese female, alert and oriented x3.  HEENT:  Pupils equal, round, and reactive to light.  Oral mucosa moist  with some missing teeth.  White coating over tongue.  NECK:  Supple without JVD or lymphadenopathy.  CHEST:  Clear to auscultation bilaterally without wheezes,  rales, or  rhonchi.  HEART:  Regular rate and rhythm without murmurs or gallops.  SKIN:  Intact except for few abrasion.  NEUROLOGIC: Cranial nerves II through XII show mild right central VII  with mild tongue deviation.  Visual fields intact.  No sensory changes  on face and legs.  Strength in right upper extremity 0/5, 5/5 in the  left upper extremity, left lower extremity is 4/5 proximally and 5/5  distally, right lower extremity is at 1/5 and the hip 1+/5 with knee and  1+ to 5 ankle plantar flexion, trace to zero at ankle dorsiflexion.  Reflexes are 1+ throughout.  Cognitively the patient is in normal  limits.  She has normal language.  Memory and mood all appropriate.   HOSPITAL COURSE:  [name] was admitted to rehab on October 28, 2008, for inpatient therapies to consist of PT, OT, and speech therapy  at least 3 hours 5 days a  week.  Past admission, physiatrist, rehab RN,  and therapy team have worked together to provide customized  collaborative interdisciplinary care.  Rehab RN has been assisting by  monitoring the patient for p.o. intake as well as to help the patient  maintain nutritional and hydration status.  They have also been  monitoring for prevention of wounds.  They have also been working with  the patient on bowel and bladder program.  The patient was toileted q.4  h. while awake.  She was noted be continent of bowel and bladder.  The  patient was treated with Flagyl through April 2 to complete treatment  for C. diff colitis.  Cipro was maintained through March 26 for her UTI  with repeat urine culture ordered on March 28.  The patient's second  urine culture showed 55,000 colonies of enterococcus and the patient was  treated with amoxicillin x5 days for this.  Labs were done past  admission revealing hemoglobin 12.2, hematocrit 34.8, white count 5.2,  platelets 163.  Check of lytes revealed mild hyponatremia with sodium  133, potassium 4.2, chloride 101, CO2 24, BUN 20, creatinine 0.98,  glucose 226.  LFTs showed some increase in alkaline phos to 109, albumin  at 3.1, AST at 30, ALT at 23.  The patient was noted to have some triple  flexion response on right lower extremity and was started on Zanaflex to  help with spasticity.  Zanaflex dose has been titrated to 2 mg b.i.d.  and 8 mg at bedtime.  The patient has been able to tolerate this without  any side effects.  The patient's blood pressures were monitored on  b.i.d. basis during this stay.  These have been well controlled ranging  from 110s to 130s systolics, 50s to 70s diastolic.  The patient's CBGs  were checked at a.c. and at bedtime basis.  NPH insulin was slowly  titrated for tighter blood sugar control.  Additionally, meal coverage  was added at 5 units t.i.d. a.c.  The patient's blood sugars have been  very variable and it has been  difficult to get a 24-hour control on her  blood sugars without seeing hypoglycemic episodes in 50s to 60s range.  Currently, NPH insulin has been titrated to keep blood sugars at 77 to  high once a day and 170s to 190s range.  The patient advised to continue  checking blood sugars b.i.d. basis past discharge.  She would prefer to  get back to her regular insulin as opposed to NovoLog and is advised to  start at 5 units b.i.d. with breakfast and supper and follow up with Dr.  Gerhard Munch for titration of her regular insulin for tighter blood sugar  management.   During the patient's stay in rehab, weekly team conferences were held to  monitor the patient's progress, set goals, as well as discuss barriers  to discharge.  At the time of admission, the patient was limited by  muscle spasms.  She was also noted to have issues with postural control  as well as left knee instability with knee buckling on attempts at  standing.  At the time of admission, the patient was max assist +2 for  safety.  OT has been working with the patient on hemi dressing  techniques as well as using right upper extremity for stabilizing.  The  OT has been working with the patient on weight shifting for static  standing at sink for self-care.  They have also been working with the  patient in neuro upper extremity group with focus on abduction,  adduction, exercises for flexion extension of upper extremity as well as  optional positioning as well as weightbearing on right side and also in  maintaining the right scapular flat in bed.  Neuro upper extremity  exercise group has addressed active assistive shoulder protraction,  retraction, elevation, as well as depression.  Currently, the patient is  min assist for wheelchair, shower, __________ transfers.  She is able to  use toilet aid for a toileting session.  She is standing at sink with  facilitating of right upper extremity on the sink.  She is able to dress  her lower  body with min steady assist, min assist for bathing, a setup  assist for upper body dressing.  Family education was done with the  patient's daughter and son with assist on use of DME as well as set up  at home, safety as well as assist needed for ADLs.  The patient is able  to direct her care.  Physical therapy evaluation at time of admission  revealed the patient limited by knee instability in standing as well as  obesity.  She was noted to have decrease in activity tolerance as well  as decreased muscle endurance, decreased sitting and standing balance  requiring close supervision for bed mobility with max cuing to use right  side max assist to sit at the edge of bed.  She was able to perform  initial stand pivot transfers to right with total assist +2 the patient  at 50%, noted to be very fearful of activity.  PT has been working with  the patient for mobility.  Initially, knee immobilizer was used to help  with the right knee instability.  Currently, the patient is min assist  for transfers.  She is able to ambulate with hemi walker as well as  right AFO with min assist for 50 feet, able to perform car transfers  with min assist.  Family education was done with the patient's son and  daughter to include ambulation, toilet transfers, as well as car  transfers.  The patient's family is able to provide written  demonstration of appropriate techniques needed for transfers as well as  ambulation.  Speech therapy evaluation done at time of admission  revealed the patient in baseline in all areas of memory, awareness,  problem-solving, as well as reasoning.  No speech therapy was indicated  during this stay.  The patient will continue with further followup home  health PT, OT by [facility] with home health RN as well as  aid to assist.  On November 18, 2008, the patient is discharged to home.   DISCHARGE MEDICATIONS:  1. Plavix 75 mg a day.  2. Benicar 20 mg half p.o. per day.   3. Protonix 40 mg a day.  4. Folic acid 1 mg a day.  5. Zocor 40 mg per day.  6. Multivitamin one per day.  7. Januvia 100 mg a day.  8. Zanaflex 4 mg half p.o. 8:00 a.m. and 6:00 p.m. 2 p.o. at 10:00      p.m.  9. NPH insulin 45 units in a.m., 25 units in p.m.  10.Regular insulin 5 units b.i.d. with breakfast and supper.   DIET:  Carb-modified diet.   ACTIVITY LEVEL:  24 hours supervision.  No strenuous activity.  No  alcohol, no smoking, no driving.   SPECIAL INSTRUCTIONS:  Check blood sugars on b.i.d. basis and record  [facility] to include PT, OT, RN, and CNA.   FOLLOWUP:  The patient to follow up with Dr. Wynn Banker on May 18 at  12:00 p.m. for 12:30 appointment.  Follow up with Dr. Gerhard Munch in couple  of weeks.      Greg Cutter, P.A.      Erick Colace, M.D.  Electronically Signed    PP/MEDQ  D:  11/18/2008  T:  11/19/2008  Job:  [id number]   cc:   Linward Foster

## 2010-12-23 NOTE — Procedures (Signed)
NAMEJOSELYNNE, KILLAM NO.:  [id number]   MEDICAL RECORD NO.:  [id number]           PATIENT TYPE:   LOCATION:                                 FACILITY:   PHYSICIAN:  Erick Colace, M.D.DATE OF BIRTH:  1951/01/28   DATE OF PROCEDURE:  DATE OF DISCHARGE:                               OPERATIVE REPORT   This is a botulinum toxin injection, right pronator teres, right flexor  carpi radialis.  Botox dilution is 2 units/mL.  REMS protocol followed.  The patient signed off.  Informed consent was obtained after describing  risks and benefits, procedure with the patient including bleeding,  bruising, infection as well as Botox effects.   The patient placed in seated position and two areas of the FCR and one  area of the pronator teres marked, prepped with Betadine and alcohol,  entered with a 26-gauge 1-inch needle electrode under needle EMG  guidance.  Proper needle EMG activity obtained after negative drawback  for blood.  A 0.5 mL injected into each of two sites in the FCR and one  is injected into one site in the right pronator teres after negative  drawback for blood.  The patient tolerated the procedure well.  Post  injection instructions given.      Erick Colace, M.D.  Electronically Signed     AEK/MEDQ  D:  04/19/2009 17:20:07  T:  04/20/2009 02:72:53  Job:  [id number]

## 2011-06-06 ENCOUNTER — Encounter: Payer: 59 | Attending: Physical Medicine & Rehabilitation

## 2011-06-06 ENCOUNTER — Ambulatory Visit (HOSPITAL_BASED_OUTPATIENT_CLINIC_OR_DEPARTMENT_OTHER): Payer: 59 | Admitting: Physical Medicine & Rehabilitation

## 2011-06-06 DIAGNOSIS — M216X9 Other acquired deformities of unspecified foot: Secondary | ICD-10-CM

## 2011-06-06 DIAGNOSIS — M25519 Pain in unspecified shoulder: Secondary | ICD-10-CM | POA: Insufficient documentation

## 2011-06-06 DIAGNOSIS — G8110 Spastic hemiplegia affecting unspecified side: Secondary | ICD-10-CM

## 2011-06-06 DIAGNOSIS — R609 Edema, unspecified: Secondary | ICD-10-CM | POA: Insufficient documentation

## 2011-06-06 DIAGNOSIS — I69959 Hemiplegia and hemiparesis following unspecified cerebrovascular disease affecting unspecified side: Secondary | ICD-10-CM | POA: Insufficient documentation

## 2011-06-06 DIAGNOSIS — I69998 Other sequelae following unspecified cerebrovascular disease: Secondary | ICD-10-CM | POA: Insufficient documentation

## 2011-06-06 DIAGNOSIS — M24549 Contracture, unspecified hand: Secondary | ICD-10-CM | POA: Insufficient documentation

## 2011-06-06 NOTE — Assessment & Plan Note (Addendum)
REASON FOR VISIT:  The patient states that mainly for right upper extremity spasticity, but she has another problem with her brace in the right lower extremity.  HISTORY:  A 60 year old female, status post left pontine infarct in 2010.  She has a chronic right spastic hemiplegia.  She has had Botox injection which has been beneficial for her in the past, but she lost her insurance and has now a new insurance.  She would like to get Botox injections once again.  She has had her last injection approximately 17 months ago.  She has had right shoulder pain, but this actually improved after a fall about a year ago.  In regards to her brace, she had a brace for over 2 years now.  It is a solid ankle plastic custom molded AFO.  She has problems with intermittent swelling as well as hyperextension of the knee.  PHYSICAL EXAMINATION:  Right shoulder has 50% range in forward flexion, extension as well as abduction.  Her tone in the right upper extremity is Ashworth grade 3 in the finger flexors and wrist flexors as well as 2 in the biceps, although she is able to flex and extend her elbow.  She has been unable to open and close her fingers.  She has persistent thumb IP flexion.  Her right lower extremity, she has a right foot drop.  Her brace fits very loosely.  She has no evidence of edema at the current time, although she states that sometimes her ankles swell as quite a bit.  Knee extension is 4/5.  IMPRESSION: 1. Right spastic hemiplegia causing some contracture at the thumb,     also unable to pronate her forearm due to tone.  I think she can     get some more functional use of her right upper extremity as she     does have some proximal strength if she reduces with tone more     distally. 2. We will schedule her for Botox injection 200 units, 50 units in the     pronator teres, 50 units in the flexor pollicis longus, 50 units in     the flexor digitorum sublimis and 50 units in the  flexor digitorum     profundus. 3. Right foot drop with fluctuating edema as well as knee     hyperextension during gait cycle, needs a new AFO.  We will send     her to Carolyne Fiscal over at [facility] to see if     he can get her with a carbon fiber anterior so that her fluctuating     edema is not as much of an issue.  My main question is whether the     hyperextension can be adequately controlled with that type of     brace.  She may need a double metal upright instead.  I will see     the patient back next available appointment for her Botox.     Erick Colace, M.D. Electronically Signed    AEK/MedQ D:  06/06/2011 11:58:08  T:  06/06/2011 13:18:36  Job #:  [id number]  cc:   Roland Earl Fax: [phone]

## 2012-02-22 ENCOUNTER — Encounter (INDEPENDENT_AMBULATORY_CARE_PROVIDER_SITE_OTHER): Payer: 59 | Admitting: Hematology and Oncology

## 2012-02-22 DIAGNOSIS — Z859 Personal history of malignant neoplasm, unspecified: Secondary | ICD-10-CM

## 2012-03-07 ENCOUNTER — Encounter (INDEPENDENT_AMBULATORY_CARE_PROVIDER_SITE_OTHER): Payer: 59 | Admitting: Hematology and Oncology

## 2012-03-07 DIAGNOSIS — I10 Essential (primary) hypertension: Secondary | ICD-10-CM

## 2012-03-07 DIAGNOSIS — E785 Hyperlipidemia, unspecified: Secondary | ICD-10-CM

## 2012-03-07 DIAGNOSIS — C7A026 Malignant carcinoid tumor of the rectum: Secondary | ICD-10-CM

## 2012-03-07 DIAGNOSIS — E119 Type 2 diabetes mellitus without complications: Secondary | ICD-10-CM

## 2012-09-07 DIAGNOSIS — I219 Acute myocardial infarction, unspecified: Secondary | ICD-10-CM

## 2012-09-07 HISTORY — DX: Acute myocardial infarction, unspecified: I21.9

## 2012-10-01 ENCOUNTER — Encounter: Payer: Self-pay | Admitting: Cardiovascular Disease

## 2012-10-08 HISTORY — PX: CORONARY ARTERY BYPASS GRAFT: SHX141

## 2012-10-22 ENCOUNTER — Encounter: Payer: Self-pay | Admitting: Cardiovascular Disease

## 2012-11-13 ENCOUNTER — Encounter: Payer: Self-pay | Admitting: Cardiovascular Disease

## 2012-11-20 ENCOUNTER — Encounter: Payer: Self-pay | Admitting: Cardiovascular Disease

## 2013-01-30 ENCOUNTER — Encounter: Payer: Self-pay | Admitting: Cardiology

## 2013-03-07 ENCOUNTER — Ambulatory Visit (INDEPENDENT_AMBULATORY_CARE_PROVIDER_SITE_OTHER): Payer: 59 | Admitting: Cardiovascular Disease

## 2013-03-07 ENCOUNTER — Encounter: Payer: Self-pay | Admitting: Cardiovascular Disease

## 2013-03-07 VITALS — BP 102/65 | HR 69 | Ht 63.0 in | Wt 255.0 lb

## 2013-03-07 DIAGNOSIS — Z951 Presence of aortocoronary bypass graft: Secondary | ICD-10-CM

## 2013-03-07 DIAGNOSIS — I6350 Cerebral infarction due to unspecified occlusion or stenosis of unspecified cerebral artery: Secondary | ICD-10-CM

## 2013-03-07 DIAGNOSIS — I6523 Occlusion and stenosis of bilateral carotid arteries: Secondary | ICD-10-CM

## 2013-03-07 DIAGNOSIS — I639 Cerebral infarction, unspecified: Secondary | ICD-10-CM | POA: Insufficient documentation

## 2013-03-07 DIAGNOSIS — I6529 Occlusion and stenosis of unspecified carotid artery: Secondary | ICD-10-CM

## 2013-03-07 DIAGNOSIS — I255 Ischemic cardiomyopathy: Secondary | ICD-10-CM

## 2013-03-07 DIAGNOSIS — I658 Occlusion and stenosis of other precerebral arteries: Secondary | ICD-10-CM

## 2013-03-07 DIAGNOSIS — I2589 Other forms of chronic ischemic heart disease: Secondary | ICD-10-CM

## 2013-03-07 MED ORDER — ASPIRIN EC 81 MG PO TBEC: 81.0000 mg | DELAYED_RELEASE_TABLET | Freq: Every day | ORAL | Status: DC

## 2013-03-07 NOTE — Patient Instructions (Signed)
   Decrease Aspirin to 81mg  daily Continue all other current medications. Follow up in  7 months - a reminder letter will be mailed to you Echo & Carotid Dopplers - to be done just prior to next office visit

## 2013-03-07 NOTE — Progress Notes (Signed)
Patient ID: Erica [name], female   DOB: 1951-02-09, 62 y.o.   MRN: [id number]    SUBJECTIVE: Erica [name] is a 62 year old female with a h/o an MI on October 01, 2012. She then underwent a 3 vessel CABG (10-08-2012) with a saphenous vein graft to the first obtuse marginal, a saphenous vein graft to the first diagonal via a y graft off of the graft to the first obtuse marginal, and a skeletonized left internal mammary artery to the left anterior descending coronary artery. She has mildly reduced LV systolic function (EF 40-45%) as per echocardiogram performed in February 2014, predating her CABG.  She also has type 2 diabetes mellitus as well as hypertension and dyslipidemia. Unfortunately in April 2010, she suffered a CVA. She was found to have less than 50% bilateral carotid disease, although she has a left carotid bruit. She had a left hemispheric CVA with right-sided hemiparesis. She was placed on Plavix by the neurologist.   She denies currently any chest pain, shortness of breath, orthopnea, or PND. She has no palpitations or syncope. She ended up wearing a monitor which showed no evidence of atrial fibrillation over 2 years ago.  BP: 102/65  Pulse: 69   PHYSICAL EXAM General: NAD Neck: No JVD, no thyromegaly or thyroid nodule.  Lungs: Clear to auscultation bilaterally with normal respiratory effort. CV: Nondisplaced PMI.  Heart regular S1/S2, no S3/S4, no murmur.  No peripheral edema.  No carotid bruit.  Normal pedal pulses.  Abdomen: Soft, nontender, no hepatosplenomegaly, no distention.  Neurologic: Alert and oriented x 3. Right-sided hemiparesis. Psych: Normal affect. Extremities: No clubbing or cyanosis.     LABS: Basic Metabolic Panel: No results found for this basename: NA, K, CL, CO2, GLUCOSE, BUN, CREATININE, CALCIUM, MG, PHOS,  in the last 72 hours Liver Function Tests: No results found for this basename: AST, ALT, ALKPHOS, BILITOT, PROT, ALBUMIN,  in the last 72 hours No  results found for this basename: LIPASE, AMYLASE,  in the last 72 hours CBC: No results found for this basename: WBC, NEUTROABS, HGB, HCT, MCV, PLT,  in the last 72 hours Cardiac Enzymes: No results found for this basename: CKTOTAL, CKMB, CKMBINDEX, TROPONINI,  in the last 72 hours BNP: No components found with this basename: POCBNP,  D-Dimer: No results found for this basename: DDIMER,  in the last 72 hours Hemoglobin A1C: No results found for this basename: HGBA1C,  in the last 72 hours Fasting Lipid Panel: No results found for this basename: CHOL, HDL, LDLCALC, TRIG, CHOLHDL, LDLDIRECT,  in the last 72 hours Thyroid Function Tests: No results found for this basename: TSH, T4TOTAL, FREET3, T3FREE, THYROIDAB,  in the last 72 hours Anemia Panel: No results found for this basename: VITAMINB12, FOLATE, FERRITIN, TIBC, IRON, RETICCTPCT,  in the last 72 hours  ECHO (Feb 2014): SUMMARY The left ventricle is mildly dilated.  Left ventricular systolic function is mildly reduced. Global hypokinesis. LV  ejection fraction = 40-45%.  Left ventricular filling pattern is pseudonormal. The right ventricle is normal in size and function. The left atrium is mildly dilated. Moderate pulmonary hypertension. There is no pericardial effusion. There is no comparison study available. - FINDINGS:  LEFT VENTRICLE The left ventricle is mildly dilated. There is normal left ventricular wall  thickness. Left ventricular systolic function is mildly reduced. LV ejection  fraction = 40-45%. Left ventricular filling pattern is pseudonormal. There is  mild global hypokinesis of the left ventricle. -  RIGHT VENTRICLE The right ventricle is  normal in size and function.  LEFT ATRIUM The left atrium is mildly dilated.  RIGHT ATRIUM  Right atrial size is normal. - AORTIC VALVE The aortic valve is normal in structure and function. Trace (trivial) aortic  regurgitation. - MITRAL VALVE The mitral  valve is normal in structure and function. There is trace mitral  regurgitation. - TRICUSPID VALVE The tricuspid valve is normal in structure and function. No tricuspid  regurgitation. Estimated right atrial pressure is 10 mmHg.. Estimated right  ventricular systolic pressure is 54.4 mmHg. Moderate pulmonary hypertension. - PULMONIC VALVE The pulmonic valve is not well visualized. Trace pulmonic valvular  regurgitation. - ARTERIES The aortic root is normal size. - VENOUS Pulmonary venous flow pattern is normal. The inferior vena cava was not  visualized during the exam. - EFFUSION There is no pericardial effusion. - - MMode/2D Measurements & Calculations Ao sinus diam: 3.1 cm Ao ST Jx Diam(2D): 2.7 cm AoV annu diam: 2.1 cm LA area A2: 23.5 cm2 LA length (vol): 5.6 cm LA vol: 80.0 ml LA vol index: 37.9 ml/m2 LA area A4: 22.5 cm2 RA area A4: 14.2 cm2 Doppler Measurements & Calculations MV E max vel: 156.7 cm/sec MV A max vel: 122.2 cm/sec MV E/A: 1.3 Med Peak E' Vel: 4.0 cm/sec Lat Peak E' Vel: 3.9 cm/sec E/Lat E`: 40.2 E/Med E`: 39.2 MV dec time: 0.16 sec Ao max PG: 15.3 mmHg Ao mean PG: 7.7 mmHg Ao V2 VTI: 36.0 cm LV V1 VTI: 24.7 cm TR max vel: 333.3 cm/sec TR max PG: 44.4 mmHg RVSP(TR): 54.4 mmHg RAP systole: 10.0 mmHg      ASSESSMENT AND PLAN: 1. CAD s/p 3-vessel CABG: continue ASA (will reduce to 81 mg daily), Metoprolol, and Zocor. 2. Ischemic cardiomyopathy: continue Lisinopril and Metoprolol. Will reassess LV systolic function by echocardiogram in early 2015. 3. Carotid artery stenosis: will plan for serial Dopplers in early 2015. Continue ASA and Plavix.    Prentice Docker, M.D., F.A.C.C.

## 2013-10-16 ENCOUNTER — Other Ambulatory Visit: Payer: Self-pay | Admitting: *Deleted

## 2013-10-16 DIAGNOSIS — Z951 Presence of aortocoronary bypass graft: Secondary | ICD-10-CM

## 2013-10-16 DIAGNOSIS — R0989 Other specified symptoms and signs involving the circulatory and respiratory systems: Secondary | ICD-10-CM

## 2013-10-16 DIAGNOSIS — I2510 Atherosclerotic heart disease of native coronary artery without angina pectoris: Secondary | ICD-10-CM

## 2013-10-23 ENCOUNTER — Other Ambulatory Visit: Payer: Self-pay

## 2013-10-23 ENCOUNTER — Other Ambulatory Visit (INDEPENDENT_AMBULATORY_CARE_PROVIDER_SITE_OTHER): Payer: 59

## 2013-10-23 ENCOUNTER — Encounter (INDEPENDENT_AMBULATORY_CARE_PROVIDER_SITE_OTHER): Payer: 59

## 2013-10-23 DIAGNOSIS — I6529 Occlusion and stenosis of unspecified carotid artery: Secondary | ICD-10-CM

## 2013-10-23 DIAGNOSIS — R0989 Other specified symptoms and signs involving the circulatory and respiratory systems: Secondary | ICD-10-CM

## 2013-10-23 DIAGNOSIS — Z951 Presence of aortocoronary bypass graft: Secondary | ICD-10-CM

## 2013-10-23 DIAGNOSIS — I2510 Atherosclerotic heart disease of native coronary artery without angina pectoris: Secondary | ICD-10-CM

## 2013-10-23 DIAGNOSIS — I2589 Other forms of chronic ischemic heart disease: Secondary | ICD-10-CM

## 2013-10-27 ENCOUNTER — Telehealth: Payer: Self-pay | Admitting: *Deleted

## 2013-10-27 NOTE — Telephone Encounter (Signed)
Patient notified and verbalized understanding. 

## 2013-10-27 NOTE — Telephone Encounter (Signed)
ECHO -   Notes Recorded by Herminio Commons, MD on 10/23/2013 at 5:25 PM Will discuss at f/u ov. Can tell patient pumping function is same as before.  -----------------------------------------------  CAROTID DOPPLER -   Notes Recorded by Herminio Commons, MD on 10/24/2013 at 10:41 AM Stable. Repeat in 2 years.

## 2013-11-05 ENCOUNTER — Encounter: Payer: Self-pay | Admitting: Cardiovascular Disease

## 2013-11-05 ENCOUNTER — Ambulatory Visit (INDEPENDENT_AMBULATORY_CARE_PROVIDER_SITE_OTHER): Payer: 59 | Admitting: Cardiovascular Disease

## 2013-11-05 ENCOUNTER — Telehealth: Payer: Self-pay | Admitting: Gastroenterology

## 2013-11-05 VITALS — BP 114/77 | HR 80 | Ht 61.0 in | Wt 279.0 lb

## 2013-11-05 DIAGNOSIS — R197 Diarrhea, unspecified: Secondary | ICD-10-CM

## 2013-11-05 DIAGNOSIS — I2589 Other forms of chronic ischemic heart disease: Secondary | ICD-10-CM

## 2013-11-05 DIAGNOSIS — I658 Occlusion and stenosis of other precerebral arteries: Secondary | ICD-10-CM

## 2013-11-05 DIAGNOSIS — I5021 Acute systolic (congestive) heart failure: Secondary | ICD-10-CM

## 2013-11-05 DIAGNOSIS — E785 Hyperlipidemia, unspecified: Secondary | ICD-10-CM

## 2013-11-05 DIAGNOSIS — I10 Essential (primary) hypertension: Secondary | ICD-10-CM

## 2013-11-05 DIAGNOSIS — I6529 Occlusion and stenosis of unspecified carotid artery: Secondary | ICD-10-CM

## 2013-11-05 DIAGNOSIS — K529 Noninfective gastroenteritis and colitis, unspecified: Secondary | ICD-10-CM

## 2013-11-05 DIAGNOSIS — Z79899 Other long term (current) drug therapy: Secondary | ICD-10-CM

## 2013-11-05 DIAGNOSIS — Z951 Presence of aortocoronary bypass graft: Secondary | ICD-10-CM

## 2013-11-05 DIAGNOSIS — I639 Cerebral infarction, unspecified: Secondary | ICD-10-CM

## 2013-11-05 DIAGNOSIS — I509 Heart failure, unspecified: Secondary | ICD-10-CM

## 2013-11-05 DIAGNOSIS — D3A098 Benign carcinoid tumors of other sites: Secondary | ICD-10-CM

## 2013-11-05 DIAGNOSIS — I255 Ischemic cardiomyopathy: Secondary | ICD-10-CM

## 2013-11-05 DIAGNOSIS — I6350 Cerebral infarction due to unspecified occlusion or stenosis of unspecified cerebral artery: Secondary | ICD-10-CM

## 2013-11-05 DIAGNOSIS — I2510 Atherosclerotic heart disease of native coronary artery without angina pectoris: Secondary | ICD-10-CM

## 2013-11-05 DIAGNOSIS — I6523 Occlusion and stenosis of bilateral carotid arteries: Secondary | ICD-10-CM

## 2013-11-05 MED ORDER — METOLAZONE 5 MG PO TABS: ORAL_TABLET | ORAL | Status: DC

## 2013-11-05 MED ORDER — TORSEMIDE 20 MG PO TABS: ORAL_TABLET | ORAL | Status: DC

## 2013-11-05 NOTE — Telephone Encounter (Signed)
I have called [facility] medical records and they are to fax [place] records

## 2013-11-05 NOTE — Patient Instructions (Signed)
   Will request Lipids from Dr. Pleas Koch  Stop Lasix  Begin Torsemide 40mg  twice a day x 5 days, then decrease to 20mg  daily   Begin Metolazone 5mg  daily x 3 days, then stop Continue all other medications.   Labs for BMET, magnesium, serum albumin - due in 5 days  Office will contact with results via phone or letter.   GI Referral to Dr. Barney Drain Follow up next week Thursday or Friday

## 2013-11-05 NOTE — Progress Notes (Signed)
Patient ID: Erica [name], female   DOB: Oct 27, 1950, 63 y.o.   MRN: [id number]      SUBJECTIVE: Erica [name] is a 63 year old female with a h/o an MI on October 01, 2012. She then underwent a 3 vessel CABG (10-08-2012) with a saphenous vein graft to the first obtuse marginal, a saphenous vein graft to the first diagonal via a y graft off of the graft to the first obtuse marginal, and a left internal mammary artery to the left anterior descending coronary artery.  She has mildly reduced LV systolic function (EF 32-44%), grade II diastolic dysfunction, and mild to moderate mitral and tricuspid regurgitation as per echocardiogram performed on 10/23/13. She also has type 2 diabetes mellitus as well as hypertension and dyslipidemia. 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 She was placed on Plavix by the neurologist.  Recent carotid Dopplers showed 40-59% right internal carotid artery stenosis and 1-39% left internal carotid artery stenosis. She wore a monitor which showed no evidence of atrial fibrillation over 2 years ago.  For the past one month, she has been experiencing increasing dyspnea with exertion and increased leg swelling bilaterally. She will get dyspneic after walking 10-15 feet. She sleeps in a recliner and has done so ever since her bypass surgery. She denies chest pain. She tries to consume low sodium foods and mostly eats sandwiches and low-sodium meats.   She has a history of carcinoid. Her last colonoscopy was approximately 2 years ago, and she believes that was with Dr. Britta Mccreedy.  She's been having chronic diarrhea multiple times a day. She's been experiencing muscular cramping both on the left and right lateral aspects of her thorax and abdomen. She denies paroxysmal nocturnal dyspnea.  She believes she had her cholesterol checked one month ago at her PCPs office.  She currently takes Lasix 20 mg daily but has been taking 40 mg in the  morning and 20 mg in the evening as directed by her PCP, with no significant relief of her lower extremity swelling.     Allergies  Allergen Reactions  . Codeine Nausea Only and Other (See Comments)    headache    Current Outpatient Prescriptions  Medication Sig Dispense Refill  . aspirin EC 81 MG tablet Take 1 tablet (81 mg total) by mouth daily.      . cholecalciferol (VITAMIN D) 1000 UNITS tablet Take 1,000 Units by mouth daily.      . clopidogrel (PLAVIX) 75 MG tablet Take 75 mg by mouth daily.      . furosemide (LASIX) 20 MG tablet Take 20 mg by mouth daily.      . insulin NPH-regular (NOVOLIN 70/30) (70-30) 100 UNIT/ML injection Inject into the skin 2 (two) times daily with a meal.       . lisinopril (PRINIVIL,ZESTRIL) 20 MG tablet Take 20 mg by mouth daily.      . metFORMIN (GLUCOPHAGE) 1000 MG tablet Take 1,000 mg by mouth 2 (two) times daily with a meal.      . metoprolol tartrate (LOPRESSOR) 25 MG tablet Take 12.5 mg by mouth 2 (two) times daily.      . Multiple Vitamin (MULTIVITAMIN) tablet Take 1 tablet by mouth daily.      Marland Kitchen omeprazole (PRILOSEC) 20 MG capsule Take 20 mg by mouth daily.      . simvastatin (ZOCOR) 40 MG tablet Take 40 mg by mouth every evening.      Marland Kitchen tiZANidine (ZANAFLEX) 4  MG tablet Take 4 mg by mouth daily.        No current facility-administered medications for this visit.    Past Medical History  Diagnosis Date  . CAD (coronary artery disease)   . MI (myocardial infarction)     Acute MI of anterior wall  . Pulmonary edema   . Chest pain   . DM (diabetes mellitus)   . HTN (hypertension)   . Stroke   . Cancer   . Colon cancer     Past Surgical History  Procedure Laterality Date  . Colon surgery    . Cholecystectomy    . Cardiac catheterization    . Tubal ligation    . Shoulder surgery    . Coronary artery bypass graft      History   Social History  . Marital Status: Legally Separated    Spouse Name: N/A    Number of Children:  N/A  . Years of Education: N/A   Occupational History  . Not on file.   Social History Main Topics  . Smoking status: Never Smoker   . Smokeless tobacco: Never Used  . Alcohol Use: No  . Drug Use: No  . Sexual Activity: Not on file   Other Topics Concern  . Not on file   Social History Narrative  . No narrative on file     Filed Vitals:   11/05/13 0806  BP: 114/77  Pulse: 80  Height: 5\' 1"  (1.549 m)  Weight: 279 lb (126.554 kg)  SpO2: 96%    PHYSICAL EXAM General: NAD, obese. Neck: No JVD, no thyromegaly. Lungs: Clear to auscultation bilaterally with normal respiratory effort. CV: Nondisplaced PMI.  Regular rate and rhythm, normal S1/S2, no S3/S4, no murmur. 2-3+ pitting pretibial and periankle edema.  Abdomen: Soft, nontender, no hepatosplenomegaly, obese.  Neurologic: Alert and oriented x 3.  Psych: Normal affect. Extremities: No clubbing or cyanosis.   ECG: reviewed and available in electronic records.   - Procedure narrative: Transthoracic echocardiography. Image quality was suboptimal, with poor acoustic transmission, and poor endocardial visualization. - Left ventricle: The cavity size was mildly dilated. Wall thickness was increased in a pattern of mild LVH. Systolic function was mildly to moderately reduced. The estimated ejection fraction was in the range of 40% to 45%. Images were inadequate for complete LV wall motion assessment, but there appeared to be mild global hypokinesis and moderate apical hypokinesis. Features are consistent with a pseudonormal left ventricular filling pattern, with concomitant abnormal relaxation and increased filling pressure (grade 2 diastolic dysfunction). Doppler parameters are consistent with high ventricular filling pressure. - Aortic valve: Mildly calcified annulus. Mildly thickened leaflets. There was no stenosis. Trivial regurgitation. - Mitral valve: Calcified annulus. Mildly thickened leaflets . Mild to  moderate posteriorly directed regurgitation. - Left atrium: The atrium was mildly dilated. - Right ventricle: The cavity size was mildly dilated. Wall thickness was normal. - Tricuspid valve: Mild-moderate regurgitation. - Inferior vena cava: The vessel was dilated; the respirophasic diameter changes were blunted (< 50%); findings are consistent with elevated central venous pressure.     ASSESSMENT AND PLAN: 1. CAD s/p 3-vessel CABG: continue ASA , metoprolol, ACEI, and Zocor.  2. Ischemic cardiomyopathy/acute on chronic systolic heart failure: She has NYHA class III features. I will temporarily hold her furosemide. I will start torsemide 40 mg twice daily for the next 5 days, in combination with metolazone 5 mg daily for the next 3 days. I will check a basic metabolic panel  to assess her renal function in 5 days, along with magnesium and albumin levels. Given her chronic diarrhea, she may be hypoproteinemic which could potentially be contributing to her lower extremity edema and third spacing. I will continue lisinopril and metoprolol.  3. Carotid artery stenosis: 81-19% RICA and 1-47% LICA. Continue ASA and Plavix. 4. Hypertension: Controlled on current therapy. 5. Hyperlipidemia: We'll obtain lipid results from her PCPs office. 6. Valvular heart disease: Presently stable. No evidence of pulmonary edema. 7. Chronic diarrhea in the setting of a history of carcinoid: She needs to have a colonoscopy. I will check a serum albumin, and she is likely hypoproteinemic which may be contributing to her third spacing and lower extremity edema. I will make a referral to gastroenterology (Dr. Oneida Alar).  Dispo: f/u 1 week.  Kate Sable, M.D., F.A.C.C.

## 2013-11-05 NOTE — Telephone Encounter (Signed)
PLEASE CALL PT. SHE NEED NPV WITHIN THE NEXT 2-3 WEEKS Dx: DIARRHEA, PMHx: CARCINOID. OBTAIN RECORDS FROM DR. Britta Mccreedy  & [facility].

## 2013-11-06 ENCOUNTER — Telehealth: Payer: Self-pay | Admitting: Gastroenterology

## 2013-11-06 ENCOUNTER — Encounter: Payer: Self-pay | Admitting: Gastroenterology

## 2013-11-06 NOTE — Telephone Encounter (Signed)
I have mailed release of records/information to the patient so she can sign and mail back to [place]. We need to get records from Dr Wylie Hail office in [place] prior to her OV with [facility] in May. I have already retrieved records from [facility].

## 2013-11-06 NOTE — Telephone Encounter (Signed)
Pt is aware of OV on 5/6 at 230 with SF and appt card mailed

## 2013-11-06 NOTE — Telephone Encounter (Signed)
REVIEWED.  

## 2013-11-12 ENCOUNTER — Encounter: Payer: Self-pay | Admitting: Cardiovascular Disease

## 2013-11-12 ENCOUNTER — Ambulatory Visit (INDEPENDENT_AMBULATORY_CARE_PROVIDER_SITE_OTHER): Payer: 59 | Admitting: Cardiovascular Disease

## 2013-11-12 VITALS — BP 129/72 | HR 83 | Ht 61.0 in | Wt 279.0 lb

## 2013-11-12 DIAGNOSIS — I509 Heart failure, unspecified: Secondary | ICD-10-CM

## 2013-11-12 DIAGNOSIS — I2589 Other forms of chronic ischemic heart disease: Secondary | ICD-10-CM

## 2013-11-12 DIAGNOSIS — R790 Abnormal level of blood mineral: Secondary | ICD-10-CM

## 2013-11-12 DIAGNOSIS — I255 Ischemic cardiomyopathy: Secondary | ICD-10-CM

## 2013-11-12 DIAGNOSIS — I5021 Acute systolic (congestive) heart failure: Secondary | ICD-10-CM

## 2013-11-12 DIAGNOSIS — E876 Hypokalemia: Secondary | ICD-10-CM

## 2013-11-12 DIAGNOSIS — I10 Essential (primary) hypertension: Secondary | ICD-10-CM

## 2013-11-12 DIAGNOSIS — Z951 Presence of aortocoronary bypass graft: Secondary | ICD-10-CM

## 2013-11-12 DIAGNOSIS — I2510 Atherosclerotic heart disease of native coronary artery without angina pectoris: Secondary | ICD-10-CM

## 2013-11-12 MED ORDER — MAGNESIUM 400 MG PO CAPS: 1.0000 | ORAL_CAPSULE | Freq: Every day | ORAL | Status: AC

## 2013-11-12 MED ORDER — TORSEMIDE 20 MG PO TABS: 20.0000 mg | ORAL_TABLET | Freq: Every day | ORAL | Status: DC

## 2013-11-12 MED ORDER — POTASSIUM CHLORIDE CRYS ER 20 MEQ PO TBCR: 20.0000 meq | EXTENDED_RELEASE_TABLET | Freq: Every day | ORAL | Status: DC

## 2013-11-12 NOTE — Progress Notes (Signed)
Patient ID: CENA BRUHN, female   DOB: [birth year], 63 y.o.   MRN: [id number]      SUBJECTIVE: The patient is here for followup after medication adjustments made at her visit with me one week ago.  In summary, she is a 63 year old female with a h/o an MI on October 01, 2012. She then underwent a 3 vessel CABG (10-08-2012) with a saphenous vein graft to the first obtuse marginal, a saphenous vein graft to the first diagonal via a y graft off of the graft to the first obtuse marginal, and a left internal mammary artery to the left anterior descending coronary artery.  She has mildly reduced LV systolic function (EF 93-71%), grade II diastolic dysfunction, and mild to moderate mitral and tricuspid regurgitation as per echocardiogram performed on 10/23/13.  She also has type 2 diabetes mellitus as well as hypertension and dyslipidemia. 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 She was placed on Plavix by the neurologist.  Recent carotid Dopplers showed 40-59% right internal carotid artery stenosis and 1-39% left internal carotid artery stenosis.  She wore a monitor which showed no evidence of atrial fibrillation over 2 years ago.   She had been experiencing increasing dyspnea with exertion and bilateral leg swelling for one month. For this reason I put her on torsemide and metolazone for 5 days. I also scheduled her to see Dr. Oneida Alar do to her history of carcinoid and chronic diarrhea.  Her basic metabolic panel on April 7 showed BUN 33, creatinine 1.28, GFR 42 mL per minute, albumin normal at 4.3, potassium low at 3.1, and magnesium low at 1.6.  Wt unchanged at 279 lbs. However, she is feeling much better and says that her leg swelling has gone down considerably and her dyspnea on exertion is much improved.  She continues to experience cramping on the lateral aspects of her chest wall as well as in her calves.       Allergies  Allergen Reactions   . Codeine Nausea Only and Other (See Comments)    headache    Current Outpatient Prescriptions  Medication Sig Dispense Refill  . aspirin EC 81 MG tablet Take 1 tablet (81 mg total) by mouth daily.      . cholecalciferol (VITAMIN D) 1000 UNITS tablet Take 1,000 Units by mouth daily.      . clopidogrel (PLAVIX) 75 MG tablet Take 75 mg by mouth daily.      . insulin NPH-regular (NOVOLIN 70/30) (70-30) 100 UNIT/ML injection Inject into the skin 2 (two) times daily with a meal.       . lisinopril (PRINIVIL,ZESTRIL) 20 MG tablet Take 20 mg by mouth daily.      . metFORMIN (GLUCOPHAGE) 1000 MG tablet Take 1,000 mg by mouth 2 (two) times daily with a meal.      . metolazone (ZAROXOLYN) 5 MG tablet Take one tab by mouth daily x 3 days only, then use as directed per MD orders.  30 tablet  1  . metoprolol tartrate (LOPRESSOR) 25 MG tablet Take 12.5 mg by mouth 2 (two) times daily.      . Multiple Vitamin (MULTIVITAMIN) tablet Take 1 tablet by mouth daily.      Marland Kitchen omeprazole (PRILOSEC) 20 MG capsule Take 20 mg by mouth daily.      . simvastatin (ZOCOR) 40 MG tablet Take 40 mg by mouth every evening.      Marland Kitchen tiZANidine (ZANAFLEX) 4 MG tablet Take 4  mg by mouth daily.       Marland Kitchen torsemide (DEMADEX) 20 MG tablet Take 2 tabs (40mg ) twice a day x 5 days, then decrease to 20mg  daily  30 tablet  6   No current facility-administered medications for this visit.    Past Medical History  Diagnosis Date  . CAD (coronary artery disease)   . MI (myocardial infarction)     Acute MI of anterior wall  . Pulmonary edema   . Chest pain   . DM (diabetes mellitus)   . HTN (hypertension)   . Stroke   . Cancer   . Colon cancer     Past Surgical History  Procedure Laterality Date  . Colon surgery    . Cholecystectomy    . Cardiac catheterization    . Tubal ligation    . Shoulder surgery    . Coronary artery bypass graft      History   Social History  . Marital Status: Legally Separated    Spouse Name:  N/A    Number of Children: N/A  . Years of Education: N/A   Occupational History  . Not on file.   Social History Main Topics  . Smoking status: Never Smoker   . Smokeless tobacco: Never Used  . Alcohol Use: No  . Drug Use: No  . Sexual Activity: Not on file   Other Topics Concern  . Not on file   Social History Narrative  . No narrative on file     Filed Vitals:   11/12/13 1411  BP: 129/72  Pulse: 83  Height: 5\' 1"  (1.549 m)  Weight: 279 lb (126.554 kg)  SpO2: 95%    PHYSICAL EXAM General: NAD, obese.  Neck: No JVD, no thyromegaly.  Lungs: Clear to auscultation bilaterally with normal respiratory effort.  CV: Nondisplaced PMI. Regular rate and rhythm, normal S1/S2, no S3/S4, no murmur. 2-3+ pitting pretibial and periankle edema.  Abdomen: Soft, nontender, no hepatosplenomegaly, obese.  Neurologic: Alert and oriented x 3.  Psych: Normal affect.  Extremities: No clubbing or cyanosis.    ECG: reviewed and available in electronic records.      ASSESSMENT AND PLAN: 1. CAD s/p 3-vessel CABG: continue ASA , metoprolol, ACEI, and Zocor.  2. Ischemic cardiomyopathy/acute on chronic systolic heart failure: She has improved considerably. I will continue torsemide 20 mg daily. For her hypokalemia and hypomagnesemia, I will start KCl 20 meq daily and magnesium oxide 400 mg daily respectively. I will check a basic metabolic panel along with magnesium level in one week. I will continue lisinopril and metoprolol.  3. Carotid artery stenosis: 47-65% RICA and 4-65% LICA. Continue ASA and Plavix.  4. Hypertension: Controlled on current therapy.  5. Hyperlipidemia: We'll obtain lipid results from her PCPs office.  6. Valvular heart disease: Presently stable. No evidence of pulmonary edema.  7. Chronic diarrhea in the setting of a history of carcinoid: She needs to have a colonoscopy. I have already made a referral to gastroenterology (Dr. Oneida Alar).   Dispo: f/u 1 month.  Kate Sable, M.D., F.A.C.C.

## 2013-11-12 NOTE — Patient Instructions (Signed)
   Continue Torsemide 20mg  daily  Begin KCl 11meq daily (potassium)  Begin Magnesium Oxide 400mg  daily   New meds sent to pharmacy, please begin both today.  Labs for BMET, Magnesium - due in 1 week, November 19, 2013 Office will contact with results via phone or letter.   Continue all other medications.   Follow up in  1 month

## 2013-11-25 ENCOUNTER — Telehealth: Payer: Self-pay | Admitting: *Deleted

## 2013-11-25 NOTE — Telephone Encounter (Signed)
Message copied by Laurine Blazer on Tue Nov 25, 2013  1:25 PM ------      Message from: Kate Sable A      Created: Mon Nov 24, 2013 12:32 PM       Ok. ------

## 2013-11-25 NOTE — Telephone Encounter (Signed)
Notes Recorded by Laurine Blazer, LPN on 8/82/8003 at 4:91 PM Patient notified and verbalized understanding. Has follow up scheduled for May with Dr. Bronson Ing.

## 2013-12-10 ENCOUNTER — Ambulatory Visit (INDEPENDENT_AMBULATORY_CARE_PROVIDER_SITE_OTHER): Payer: 59 | Admitting: Gastroenterology

## 2013-12-10 ENCOUNTER — Encounter (INDEPENDENT_AMBULATORY_CARE_PROVIDER_SITE_OTHER): Payer: Self-pay

## 2013-12-10 ENCOUNTER — Encounter: Payer: Self-pay | Admitting: Gastroenterology

## 2013-12-10 VITALS — BP 115/63 | HR 92 | Temp 97.4°F | Ht 64.0 in | Wt 247.6 lb

## 2013-12-10 DIAGNOSIS — R197 Diarrhea, unspecified: Secondary | ICD-10-CM

## 2013-12-10 DIAGNOSIS — D3A00 Benign carcinoid tumor of unspecified site: Secondary | ICD-10-CM

## 2013-12-10 MED ORDER — COLESTIPOL HCL 1 G PO TABS: ORAL_TABLET | ORAL | Status: DC

## 2013-12-10 NOTE — Progress Notes (Signed)
Subjective:    Patient ID: Erica [name], female    DOB: May 21, 1951, 63 y.o.   MRN: [id number] Curlene Labrum, MD  HPI INTERMITTENT LOOSE/WATERY STOOLS FOR YEARS AND THEN SX STARTED YO GET WORSE 3 MOS AGO. WAS EVERY DAY. IN PAST WEEK HAD 2 EPISODES.  AFFECTING HER SOCIAL INTERACTION. MEDS CHANGED AND WHAT SHE EATS DOESN'T GO RIGHT THROUGH HER. WAS EATING IMODIUM LIKE CRAZY. SINCE MEDS CHANGED HAS USED 3 IMODIUM. PLACED HER ON MG AND KCL. HAD CARCINOID IN COLON?? NO SURGERY. SAW DR. KARB IN 2013. NEXT EVAL DUE AUG 2015. NO ABX. HAS WELL WATER. ABD PAIN: 2-3 DAYS A MONTH. LOWER ABD PAIN BETTER AFTER BM AND USU GONE AFTER 1 HR. OMEPRAZOLE CONTROLS HEARTBURN. NO CIGS OR ETOH. WORKS AS AN ACCOUNTANT. NO TRAVEL.PT DENIES FEVER, CHILLS, BRBPR, nausea, vomiting, melena, constipation,  problems swallowing, problems with sedation, OR heartburn or indigestion. NO PALPITATIONS, OR FLUSHING, OR PROFUSE SWEATING. DR. Britta Mccreedy DID LAST IN 2013-NO POLYPS. NO STOOL STUDIES.  Past Medical History  Diagnosis Date  . CAD (coronary artery disease)   . MI (myocardial infarction)     Acute MI of anterior wall  . Pulmonary edema   . Chest pain   . DM (diabetes mellitus)   . HTN (hypertension)   . Stroke   . Cancer   . Colon cancer   . Carcinoid tumor    Past Surgical History  Procedure Laterality Date  . Colon surgery    . Cholecystectomy    . Cardiac catheterization    . Tubal ligation    . Shoulder surgery    . Coronary artery bypass graft     Allergies  Allergen Reactions  . Codeine Nausea Only and Other (See Comments)    headache   Current Outpatient Prescriptions  Medication Sig Dispense Refill  . aspirin EC 81 MG tablet Take 1 tablet (81 mg total) by mouth daily.      . cholecalciferol (VITAMIN D) 1000 UNITS tablet Take 1,000 Units by mouth daily.      . clopidogrel (PLAVIX) 75 MG tablet Take 75 mg by mouth daily.      . insulin NPH-regular (NOVOLIN 70/30) (70-30) 100 UNIT/ML injection  Inject 40 Units into the skin 2 (two) times daily with a meal.   40 IN AM/ 20 IN PM    . lisinopril (PRINIVIL,ZESTRIL) 20 MG tablet Take 20 mg by mouth daily.      . Magnesium 400 MG CAPS Take 1 capsule by mouth daily.  30 capsule  6  . metFORMIN (GLUCOPHAGE) 1000 MG tablet Take 1,000 mg by mouth 2 (two) times daily with a meal.      . metoprolol tartrate (LOPRESSOR) 25 MG tablet Take 12.5 mg by mouth 2 (two) times daily.      . Multiple Vitamin (MULTIVITAMIN) tablet Take 1 tablet by mouth daily.      Marland Kitchen omeprazole (PRILOSEC) 20 MG capsule Take 20 mg by mouth daily.      . potassium chloride SA (K-DUR,KLOR-CON) 20 MEQ tablet Take 1 tablet (20 mEq total) by mouth daily.  30 tablet  6  . simvastatin (ZOCOR) 40 MG tablet Take 40 mg by mouth every evening.      Marland Kitchen tiZANidine (ZANAFLEX) 4 MG tablet Take 4 mg by mouth daily.        Family History  Problem Relation Age of Onset  . Diabetes Mother   . Heart disease Mother   . Colon polyps Neg Hx   .  Colon cancer Paternal Uncle     History  Substance Use Topics  . Smoking status: Never Smoker   . Smokeless tobacco: Never Used  . Alcohol Use: No   ON DISABILITY. WORKS PART TIME.       Review of Systems PER HPI OTHERWISE ALL SYSTEMS ARE NEGATIVE.     Objective:   Physical Exam  Vitals reviewed. Constitutional: She is oriented to person, place, and time. She appears well-nourished. No distress.  HENT:  Head: Normocephalic and atraumatic.  Mouth/Throat: Oropharynx is clear and moist. No oropharyngeal exudate.  Eyes: Pupils are equal, round, and reactive to light. No scleral icterus.  Neck: Normal range of motion. Neck supple.  Cardiovascular: Normal rate, regular rhythm and normal heart sounds.   Pulmonary/Chest: Effort normal and breath sounds normal. No respiratory distress.  Abdominal: Soft. Bowel sounds are normal. She exhibits no distension. There is no tenderness.  Musculoskeletal: She exhibits no edema.  WALKS ASSISTED WITH A  CANE.   Lymphadenopathy:    She has no cervical adenopathy.  Neurological: She is alert and oriented to person, place, and time.  NO  NEW FOCAL DEFICITS. RUE/LE HEMIPARESIS.  Psychiatric: She has a normal mood and affect.          Assessment & Plan:

## 2013-12-10 NOTE — Patient Instructions (Signed)
To PREVENT EXPLOSIVE WATERY DIARRHEA:  1. STRICTLY ADHERE TO A LOW FAT/DIABETIC DIET. SEE INFO BELOW  2. USE COLESTID 30 MINS PRIOR TO BREAKFAST AND SUPPER. DISCUSS HOW TO TAKE OTHER MEDS AROUND THE COLESTID. IT MAY CAUSE PROBLEMS WITH ABSORPTION.   SUBMIT BLOOD AND URINE SAMPLE.  FOLLOW UP IN 2 MOS.    Low-Fat Diet BREADS, CEREALS, PASTA, RICE, DRIED PEAS, AND BEANS These products are high in carbohydrates and most are low in fat. Therefore, they can be increased in the diet as substitutes for fatty foods. They too, however, contain calories and should not be eaten in excess. Cereals can be eaten for snacks as well as for breakfast.   FRUITS AND VEGETABLES It is good to eat fruits and vegetables. Besides being sources of fiber, both are rich in vitamins and some minerals. They help you get the daily allowances of these nutrients. Fruits and vegetables can be used for snacks and desserts.  MEATS Limit lean meat, chicken, [place], and fish to no more than 6 ounces per day. Beef, Pork, and Lamb Use lean cuts of beef, pork, and lamb. Lean cuts include:  Extra-lean ground beef.  Arm roast.  Sirloin tip.  Center-cut ham.  Round steak.  Loin chops.  Rump roast.  Tenderloin.  Trim all fat off the outside of meats before cooking. It is not necessary to severely decrease the intake of red meat, but lean choices should be made. Lean meat is rich in protein and contains a highly absorbable form of iron. Premenopausal women, in particular, should avoid reducing lean red meat because this could increase the risk for low red blood cells (iron-deficiency anemia).  Chicken and [place] These are good sources of protein. The fat of poultry can be reduced by removing the skin and underlying fat layers before cooking. Chicken and [place] can be substituted for lean red meat in the diet. Poultry should not be fried or covered with high-fat sauces. Fish and Shellfish Fish is a good source of protein.  Shellfish contain cholesterol, but they usually are low in saturated fatty acids. The preparation of fish is important. Like chicken and [place], they should not be fried or covered with high-fat sauces. EGGS Egg whites contain no fat or cholesterol. They can be eaten often. Try 1 to 2 egg whites instead of whole eggs in recipes or use egg substitutes that do not contain yolk. MILK AND DAIRY PRODUCTS Use skim or 1% milk instead of 2% or whole milk. Decrease whole milk, natural, and processed cheeses. Use nonfat or low-fat (2%) cottage cheese or low-fat cheeses made from vegetable oils. Choose nonfat or low-fat (1 to 2%) yogurt. Experiment with evaporated skim milk in recipes that call for heavy cream. Substitute low-fat yogurt or low-fat cottage cheese for sour cream in dips and salad dressings. Have at least 2 servings of low-fat dairy products, such as 2 glasses of skim (or 1%) milk each day to help get your daily calcium intake. FATS AND OILS Reduce the total intake of fats, especially saturated fat. Butterfat, lard, and beef fats are high in saturated fat and cholesterol. These should be avoided as much as possible. Vegetable fats do not contain cholesterol, but certain vegetable fats, such as coconut oil, palm oil, and palm kernel oil are very high in saturated fats. These should be limited. These fats are often used in bakery goods, processed foods, popcorn, oils, and nondairy creamers. Vegetable shortenings and some peanut butters contain hydrogenated oils, which are also saturated  fats. Read the labels on these foods and check for saturated vegetable oils. Unsaturated vegetable oils and fats do not raise blood cholesterol. However, they should be limited because they are fats and are high in calories. Total fat should still be limited to 30% of your daily caloric intake. Desirable liquid vegetable oils are corn oil, cottonseed oil, olive oil, canola oil, safflower oil, soybean oil, and sunflower oil.  Peanut oil is not as good, but small amounts are acceptable. Buy a heart-healthy tub margarine that has no partially hydrogenated oils in the ingredients. Mayonnaise and salad dressings often are made from unsaturated fats, but they should also be limited because of their high calorie and fat content. Seeds, nuts, peanut butter, olives, and avocados are high in fat, but the fat is mainly the unsaturated type. These foods should be limited mainly to avoid excess calories and fat. OTHER EATING TIPS Snacks  Most sweets should be limited as snacks. They tend to be rich in calories and fats, and their caloric content outweighs their nutritional value. Some good choices in snacks are graham crackers, melba toast, soda crackers, bagels (no egg), English muffins, fruits, and vegetables. These snacks are preferable to snack crackers, [place] fries, TORTILLA CHIPS, and POTATO chips. Popcorn should be air-popped or cooked in small amounts of liquid vegetable oil. Desserts Eat fruit, low-fat yogurt, and fruit ices instead of pastries, cake, and cookies. Sherbet, angel food cake, gelatin dessert, frozen low-fat yogurt, or other frozen products that do not contain saturated fat (pure fruit juice bars, frozen ice pops) are also acceptable.  COOKING METHODS Choose those methods that use little or no fat. They include: Poaching.  Braising.  Steaming.  Grilling.  Baking.  Stir-frying.  Broiling.  Microwaving.  Foods can be cooked in a nonstick pan without added fat, or use a nonfat cooking spray in regular cookware. Limit fried foods and avoid frying in saturated fat. Add moisture to lean meats by using water, broth, cooking wines, and other nonfat or low-fat sauces along with the cooking methods mentioned above. Soups and stews should be chilled after cooking. The fat that forms on top after a few hours in the refrigerator should be skimmed off. When preparing meals, avoid using excess salt. Salt can contribute to  raising blood pressure in some people.  EATING AWAY FROM HOME Order entres, potatoes, and vegetables without sauces or butter. When meat exceeds the size of a deck of cards (3 to 4 ounces), the rest can be taken home for another meal. Choose vegetable or fruit salads and ask for low-calorie salad dressings to be served on the side. Use dressings sparingly. Limit high-fat toppings, such as bacon, crumbled eggs, cheese, sunflower seeds, and olives. Ask for heart-healthy tub margarine instead of butter.

## 2013-12-10 NOTE — Assessment & Plan Note (Addendum)
DUE FOR SURVEILLANCE AND HAVING EXPLOSIVE DIARRHEA, DOUBT CARCINOID SYNDROME.  CHECK 5-HIAA AND CHROMOGRANIN-A. CONSIDER TCS IF SX NOT IMPROVED.

## 2013-12-10 NOTE — Assessment & Plan Note (Signed)
SX IMPROVED BUT NOT BACK TO BASELINE AFTER MEDS CHANGED. MOST LIKELY DUE TO MEDS AND/OR BILE SALT INDUCED DIARRHEA, LESS LIKELY CARCINOID SYNDROME, CELIAC SPRUE OR MICROSCOPIC COLITIS.  DISCUSSED MANAGEMENT OPTIONS. PT PREFERS STEP WISE APPROACH. PT FELT WOULD BE DIFFICULT TO COLLECT STOOL SAMPLE.  CHECK CHROMOGRANIN A/5-HIAA IN MAY 2015. ADD COLESTID BEFORE BREAKFAST AND SUPPER. MED SIDE EFFECTS/INTERACTIONS DISCUSSED. STRICTLY ADHERE TO LOW FAT DIET. OPV IN 2 MOS. IF SX NOT IMPROVED CONSIDER TCS WITH RANDOM BIOPSIES.

## 2013-12-11 NOTE — Progress Notes (Signed)
cc'd to pcp 

## 2013-12-15 NOTE — Progress Notes (Signed)
Reminder in epic °

## 2013-12-17 LAB — 5 HIAA W/CREATININE, 24 HR
5-HIAA: 2.8 mg/24 h (ref <=6.0)
Creatinine: 0.7 g/(24.h) (ref 0.63–2.50)
TOTAL VOLUME 5 HIAA W/ CREATININE: 1100 mL

## 2013-12-18 ENCOUNTER — Ambulatory Visit: Payer: 59 | Admitting: Cardiovascular Disease

## 2013-12-18 ENCOUNTER — Encounter: Payer: Self-pay | Admitting: Cardiovascular Disease

## 2013-12-18 ENCOUNTER — Ambulatory Visit (INDEPENDENT_AMBULATORY_CARE_PROVIDER_SITE_OTHER): Payer: 59 | Admitting: Cardiovascular Disease

## 2013-12-18 VITALS — BP 107/60 | HR 80 | Ht 61.0 in | Wt 246.0 lb

## 2013-12-18 DIAGNOSIS — I2589 Other forms of chronic ischemic heart disease: Secondary | ICD-10-CM

## 2013-12-18 DIAGNOSIS — E785 Hyperlipidemia, unspecified: Secondary | ICD-10-CM

## 2013-12-18 DIAGNOSIS — I658 Occlusion and stenosis of other precerebral arteries: Secondary | ICD-10-CM

## 2013-12-18 DIAGNOSIS — R790 Abnormal level of blood mineral: Secondary | ICD-10-CM

## 2013-12-18 DIAGNOSIS — I2510 Atherosclerotic heart disease of native coronary artery without angina pectoris: Secondary | ICD-10-CM

## 2013-12-18 DIAGNOSIS — I5022 Chronic systolic (congestive) heart failure: Secondary | ICD-10-CM

## 2013-12-18 DIAGNOSIS — I6523 Occlusion and stenosis of bilateral carotid arteries: Secondary | ICD-10-CM

## 2013-12-18 DIAGNOSIS — E876 Hypokalemia: Secondary | ICD-10-CM

## 2013-12-18 DIAGNOSIS — I255 Ischemic cardiomyopathy: Secondary | ICD-10-CM

## 2013-12-18 DIAGNOSIS — K529 Noninfective gastroenteritis and colitis, unspecified: Secondary | ICD-10-CM

## 2013-12-18 DIAGNOSIS — I25810 Atherosclerosis of coronary artery bypass graft(s) without angina pectoris: Secondary | ICD-10-CM

## 2013-12-18 DIAGNOSIS — I10 Essential (primary) hypertension: Secondary | ICD-10-CM

## 2013-12-18 DIAGNOSIS — I6529 Occlusion and stenosis of unspecified carotid artery: Secondary | ICD-10-CM

## 2013-12-18 DIAGNOSIS — R197 Diarrhea, unspecified: Secondary | ICD-10-CM

## 2013-12-18 LAB — CHROMOGRANIN A: Chromogranin A: 250 ng/mL — ABNORMAL HIGH (ref 1.9–15.0)

## 2013-12-18 NOTE — Patient Instructions (Signed)
Continue all current medications. Follow up in  4 months  

## 2013-12-18 NOTE — Progress Notes (Signed)
Patient ID: Erica [name], female   DOB: 1951-05-26, 63 y.o.   MRN: [id number]      SUBJECTIVE: The patient is here for routine cardiovascular followup. In summary, she is a 63 year old female with a h/o an MI on October 01, 2012. She then underwent a 3 vessel CABG (10-08-2012) with a saphenous vein graft to the first obtuse marginal, a saphenous vein graft to the first diagonal via a y graft off of the graft to the first obtuse marginal, and a left internal mammary artery to the left anterior descending coronary artery.  She has mildly reduced LV systolic function (EF 40-98%), grade II diastolic dysfunction, and mild to moderate mitral and tricuspid regurgitation as per echocardiogram performed on 10/23/13.  She also has type 2 diabetes mellitus as well as hypertension and dyslipidemia. 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 She was placed on Plavix by the neurologist.  Most recent carotid Dopplers showed 40-59% right internal carotid artery stenosis and 1-39% left internal carotid artery stenosis.  She wore a monitor which showed no evidence of atrial fibrillation over 2 years ago.   She is taking torsemide for leg edema, along with potassium and magnesium supplements. K 4.5 and Mg 2.1 on 4/16.  Wt 246 lbs today. 279 lbs on 4/8.  She is feeling very well and denies chest pain, shortness of breath, and leg swelling. Her diarrhea has tapered off since starting colestipol.   Allergies  Allergen Reactions  . Codeine Nausea Only and Other (See Comments)    headache    Current Outpatient Prescriptions  Medication Sig Dispense Refill  . aspirin EC 81 MG tablet Take 1 tablet (81 mg total) by mouth daily.      . cholecalciferol (VITAMIN D) 1000 UNITS tablet Take 1,000 Units by mouth daily.      . clopidogrel (PLAVIX) 75 MG tablet Take 75 mg by mouth daily.      . colestipol (COLESTID) 1 G tablet 1 PO 30 MINS PRIOR TO BREAKFAST AND SUPPER.   60 tablet  11  . insulin NPH-regular (NOVOLIN 70/30) (70-30) 100 UNIT/ML injection Inject 20-40 Units into the skin 2 (two) times daily with a meal.       . lisinopril (PRINIVIL,ZESTRIL) 20 MG tablet Take 20 mg by mouth daily.      . Magnesium 400 MG CAPS Take 1 capsule by mouth daily.  30 capsule  6  . metFORMIN (GLUCOPHAGE) 1000 MG tablet Take 1,000 mg by mouth 2 (two) times daily with a meal.      . metoprolol tartrate (LOPRESSOR) 25 MG tablet Take 12.5 mg by mouth 2 (two) times daily.      . Multiple Vitamin (MULTIVITAMIN) tablet Take 1 tablet by mouth daily.      Marland Kitchen omeprazole (PRILOSEC) 20 MG capsule Take 20 mg by mouth daily.      . potassium chloride SA (K-DUR,KLOR-CON) 20 MEQ tablet Take 1 tablet (20 mEq total) by mouth daily.  30 tablet  6  . simvastatin (ZOCOR) 40 MG tablet Take 40 mg by mouth every evening.      Marland Kitchen tiZANidine (ZANAFLEX) 4 MG tablet Take 4 mg by mouth daily.       Marland Kitchen torsemide (DEMADEX) 20 MG tablet Take 20 mg by mouth daily.        No current facility-administered medications for this visit.    Past Medical History  Diagnosis Date  . CAD (coronary artery disease)   .  MI (myocardial infarction) FEB 2014    Acute MI of anterior wall  . Pulmonary edema   . Chest pain   . DM (diabetes mellitus)   . HTN (hypertension)   . Stroke 2010 RIGHT SIDED WEAKNESS  . Colon cancer   . Carcinoid tumor     Past Surgical History  Procedure Laterality Date  . Cholecystectomy    . Cardiac catheterization    . Tubal ligation    . Shoulder surgery    . Coronary artery bypass graft      History   Social History  . Marital Status: Legally Separated    Spouse Name: N/A    Number of Children: N/A  . Years of Education: N/A   Occupational History  . Not on file.   Social History Main Topics  . Smoking status: Never Smoker   . Smokeless tobacco: Never Used  . Alcohol Use: No  . Drug Use: No  . Sexual Activity: Not on file   Other Topics Concern  . Not on file    Social History Narrative  . No narrative on file     Filed Vitals:   12/18/13 1055  Height: 5\' 1"  (1.549 m)  Weight: 246 lb (111.585 kg)   BP 107/60 Pulse 80   PHYSICAL EXAM General: NAD Neck: No JVD, no thyromegaly. Lungs: Clear to auscultation bilaterally with normal respiratory effort. CV: Nondisplaced PMI.  Regular rate and rhythm, normal S1/S2, no S3/S4, no murmur. Trace pretibial and periankle edema.  Abdomen: Soft, nontender, no distention.  Neurologic: Alert and oriented x 3.  Psych: Normal affect. Extremities: No clubbing or cyanosis.   ECG: reviewed and available in electronic records.      ASSESSMENT AND PLAN: 1. CAD s/p 3-vessel CABG: Symptomatically stable. Continue ASA , metoprolol, ACEI, and Zocor.  2. Ischemic cardiomyopathy/acute on chronic systolic heart failure: She has improved considerably. I will continue torsemide 20 mg daily. For her prior hypokalemia and hypomagnesemia, I will continue KCl 20 meq daily and magnesium oxide 400 mg daily respectively. I will continue lisinopril and metoprolol.  3. Carotid artery stenosis: 74-94% RICA and 4-96% LICA. Continue ASA and Plavix.  4. Hypertension: Controlled on current therapy.  5. Hyperlipidemia: Will obtain lipid results from her PCPs office.  6. Valvular heart disease: Presently stable. No evidence of pulmonary edema.  7. 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 Oneida Alar). 5-HIAA normal. Diarrhea has tapered off since starting colestipol.  Dispo: f/u 4 months.    Kate Sable, M.D., F.A.C.C.

## 2014-01-07 NOTE — Progress Notes (Signed)
PLEASE CALL PT. HER CHROMOGRANIN-A IS ELEVATED AT 250. HER URINE 5-HIAA IS NORMAL. SHE SHOULD DISCUSS WITH DR. KARB OR [facility].

## 2014-01-15 NOTE — Progress Notes (Signed)
I called and informed pt. She said Dr. Sonny Dandy has retired and she has not been seen there in over 5 years and was told she will need another referral if she comes back.  She sees her PCP for [place] this afternoon. She is aware that the labs have been sent to him.  She will see if he will make the referral and if she has problems she will call us.

## 2014-03-04 ENCOUNTER — Encounter: Payer: Self-pay | Admitting: Gastroenterology

## 2014-04-30 ENCOUNTER — Telehealth: Payer: Self-pay | Admitting: *Deleted

## 2014-04-30 ENCOUNTER — Ambulatory Visit (INDEPENDENT_AMBULATORY_CARE_PROVIDER_SITE_OTHER): Payer: 59 | Admitting: Cardiovascular Disease

## 2014-04-30 ENCOUNTER — Encounter: Payer: Self-pay | Admitting: Cardiovascular Disease

## 2014-04-30 VITALS — BP 131/75 | HR 62 | Ht 62.0 in | Wt 249.8 lb

## 2014-04-30 DIAGNOSIS — E785 Hyperlipidemia, unspecified: Secondary | ICD-10-CM

## 2014-04-30 DIAGNOSIS — E119 Type 2 diabetes mellitus without complications: Secondary | ICD-10-CM

## 2014-04-30 DIAGNOSIS — I10 Essential (primary) hypertension: Secondary | ICD-10-CM

## 2014-04-30 DIAGNOSIS — I5022 Chronic systolic (congestive) heart failure: Secondary | ICD-10-CM

## 2014-04-30 DIAGNOSIS — R790 Abnormal level of blood mineral: Secondary | ICD-10-CM

## 2014-04-30 DIAGNOSIS — I6350 Cerebral infarction due to unspecified occlusion or stenosis of unspecified cerebral artery: Secondary | ICD-10-CM

## 2014-04-30 DIAGNOSIS — I639 Cerebral infarction, unspecified: Secondary | ICD-10-CM

## 2014-04-30 DIAGNOSIS — E876 Hypokalemia: Secondary | ICD-10-CM

## 2014-04-30 DIAGNOSIS — I25810 Atherosclerosis of coronary artery bypass graft(s) without angina pectoris: Secondary | ICD-10-CM

## 2014-04-30 DIAGNOSIS — R197 Diarrhea, unspecified: Secondary | ICD-10-CM

## 2014-04-30 DIAGNOSIS — I6523 Occlusion and stenosis of bilateral carotid arteries: Secondary | ICD-10-CM

## 2014-04-30 DIAGNOSIS — I658 Occlusion and stenosis of other precerebral arteries: Secondary | ICD-10-CM

## 2014-04-30 DIAGNOSIS — I255 Ischemic cardiomyopathy: Secondary | ICD-10-CM

## 2014-04-30 DIAGNOSIS — I2589 Other forms of chronic ischemic heart disease: Secondary | ICD-10-CM

## 2014-04-30 DIAGNOSIS — K529 Noninfective gastroenteritis and colitis, unspecified: Secondary | ICD-10-CM

## 2014-04-30 DIAGNOSIS — I6529 Occlusion and stenosis of unspecified carotid artery: Secondary | ICD-10-CM

## 2014-04-30 MED ORDER — CLOPIDOGREL BISULFATE 75 MG PO TABS: 75.0000 mg | ORAL_TABLET | Freq: Every day | ORAL | Status: DC

## 2014-04-30 NOTE — Telephone Encounter (Signed)
Pt informed to d/c asa and continue plavix.

## 2014-04-30 NOTE — Telephone Encounter (Signed)
Message copied by Orion Modest on Thu Apr 30, 2014  9:38 AM ------      Message from: Kate Sable A      Created: Thu Apr 30, 2014  8:50 AM      Regarding: Discontinue ASA instead and continue Plavix alone       Upon further review, given her h/o stroke, it would be best to continue Plavix alone and discontinue ASA.      Please inform patient.      Thank you.            Dr. Bronson Ing ------

## 2014-04-30 NOTE — Telephone Encounter (Signed)
I will call back later

## 2014-04-30 NOTE — Patient Instructions (Signed)
   Stop Plavix Continue all other medications.   Your physician wants you to follow up in: 6 months.  You will receive a reminder letter in the mail one-two months in advance.  If you don't receive a letter, please call our office to schedule the follow up appointment.

## 2014-04-30 NOTE — Progress Notes (Signed)
Patient ID: Erica [name], female   DOB: [birth year], 63 y.o.   MRN: [id number]      SUBJECTIVE: The patient is here for routine cardiovascular followup. In summary, she is a 63 year old female with a h/o an MI on October 01, 2012. She then underwent a 3 vessel CABG (10-08-2012) with a saphenous vein graft to the first obtuse marginal, a saphenous vein graft to the first diagonal via a y graft off of the graft to the first obtuse marginal, and a left internal mammary artery to the left anterior descending coronary artery.  She has mildly reduced LV systolic function (EF 27-06%), grade II diastolic dysfunction, and mild to moderate mitral and tricuspid regurgitation as per echocardiogram performed on 10/23/13.  She also has type 2 diabetes mellitus as well as hypertension and dyslipidemia. 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 She was placed on Plavix by the neurologist.  Most recent carotid Dopplers showed 40-59% right internal carotid artery stenosis and 1-39% left internal carotid artery stenosis.  She wore a monitor which showed no evidence of atrial fibrillation over 2 years ago.  She is taking torsemide for leg edema, along with potassium and magnesium supplements.   Wt 249 lbs today. 246 lbs on 12/18/13. She is feeling well, and denies chest pain, shortness of breath, palpitations, orthopnea and paroxysmal nocturnal dyspnea. She recently made a trip to [place] [facility] the [facility].  Labs on 04/06/14 by PCP demonstrated TC 143, TG 144, HDL 55, LDL 59, TSH 1.27, GFR 51 cc/min, HbA1C 6.4%.   Review of Systems: As per "subjective", otherwise negative.  Allergies  Allergen Reactions  . Codeine Nausea Only and Other (See Comments)    headache    Current Outpatient Prescriptions  Medication Sig Dispense Refill  . aspirin EC 81 MG tablet Take 1 tablet (81 mg total) by mouth daily.      . cholecalciferol (VITAMIN D) 1000 UNITS  tablet Take 1,000 Units by mouth daily.      . clopidogrel (PLAVIX) 75 MG tablet Take 75 mg by mouth daily.      . colestipol (COLESTID) 1 G tablet 1 PO 30 MINS PRIOR TO BREAKFAST AND SUPPER.  60 tablet  11  . insulin NPH-regular (NOVOLIN 70/30) (70-30) 100 UNIT/ML injection Inject 20-40 Units into the skin 2 (two) times daily with a meal.       . lisinopril (PRINIVIL,ZESTRIL) 20 MG tablet Take 20 mg by mouth daily.      . Magnesium 400 MG CAPS Take 1 capsule by mouth daily.  30 capsule  6  . metFORMIN (GLUCOPHAGE) 1000 MG tablet Take 1,000 mg by mouth 2 (two) times daily with a meal.      . metoprolol tartrate (LOPRESSOR) 25 MG tablet Take 12.5 mg by mouth 2 (two) times daily.      . Multiple Vitamin (MULTIVITAMIN) tablet Take 1 tablet by mouth daily.      Marland Kitchen omeprazole (PRILOSEC) 20 MG capsule Take 20 mg by mouth daily.      . potassium chloride SA (K-DUR,KLOR-CON) 20 MEQ tablet Take 1 tablet (20 mEq total) by mouth daily.  30 tablet  6  . simvastatin (ZOCOR) 40 MG tablet Take 40 mg by mouth every evening.      Marland Kitchen tiZANidine (ZANAFLEX) 4 MG tablet Take 4 mg by mouth daily.       Marland Kitchen torsemide (DEMADEX) 20 MG tablet Take 20 mg by mouth daily.  No current facility-administered medications for this visit.    Past Medical History  Diagnosis Date  . CAD (coronary artery disease)   . MI (myocardial infarction) FEB 2014    Acute MI of anterior wall  . Pulmonary edema   . Chest pain   . DM (diabetes mellitus)   . HTN (hypertension)   . Stroke 2010 RIGHT SIDED WEAKNESS  . Colon cancer   . Carcinoid tumor     Past Surgical History  Procedure Laterality Date  . Cholecystectomy    . Cardiac catheterization    . Tubal ligation    . Shoulder surgery    . Coronary artery bypass graft      History   Social History  . Marital Status: Legally Separated    Spouse Name: N/A    Number of Children: N/A  . Years of Education: N/A   Occupational History  . Not on file.   Social  History Main Topics  . Smoking status: Never Smoker   . Smokeless tobacco: Never Used  . Alcohol Use: No  . Drug Use: No  . Sexual Activity: Not on file   Other Topics Concern  . Not on file   Social History Narrative  . No narrative on file     Filed Vitals:   04/30/14 0814  BP: 131/75  Pulse: 62  Height: 5\' 2"  (1.575 m)  Weight: 249 lb 12 oz (113.286 kg)  SpO2: 98%    PHYSICAL EXAM General: NAD HEENT: Normal. Neck: No JVD, no thyromegaly. Lungs: Clear to auscultation bilaterally with normal respiratory effort. CV: Nondisplaced PMI.  Regular rate and rhythm, normal S1/S2, no S3/S4, no murmur. Trace periankle edema.  No carotid bruit.  Normal pedal pulses.  Abdomen: Soft, nontender, no hepatosplenomegaly, no distention.  Neurologic: Alert and oriented x 3.  Psych: Normal affect. Skin: Normal. Musculoskeletal: Weakness of right arm and leg. Extremities: No clubbing or cyanosis.   ECG: Most recent ECG reviewed.      ASSESSMENT AND PLAN: 1. CAD s/p 3-vessel CABG: Stable ischemic heart disease. As she is on Plavix for CVA secondary prevention, I will discontinue ASA but continue metoprolol, ACEI, and Zocor.  2. Ischemic cardiomyopathy/acute on chronic systolic heart failure: Euvolemic and stable. I will continue torsemide 20 mg daily. For her prior hypokalemia and hypomagnesemia, I will continue KCl 20 meq daily and magnesium oxide 400 mg daily respectively. I will continue lisinopril and metoprolol.  3. Carotid artery stenosis: 76-73% RICA and 4-19% LICA. Continue Plavix.  4. Essential ypertension: Controlled on current therapy.  5. Hyperlipidemia: Lipids controlled as noted above. Continue simvastatin 40 mg. 6. Valvular heart disease: Presently stable. No evidence of pulmonary edema.  7. 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 Oneida Alar). 5-HIAA normal. Controlled with colestipol.  8. Type 2 diabetes: On insulin and  metformin.  Dispo: f/u 6 months.   Kate Sable, M.D., F.A.C.C.

## 2014-04-30 NOTE — Telephone Encounter (Signed)
Patient returned your call.  Call her back after 1 please

## 2014-06-05 ENCOUNTER — Telehealth: Payer: Self-pay | Admitting: Cardiovascular Disease

## 2014-06-05 MED ORDER — TORSEMIDE 20 MG PO TABS: 20.0000 mg | ORAL_TABLET | Freq: Every day | ORAL | Status: DC

## 2014-06-05 NOTE — Telephone Encounter (Signed)
Done

## 2014-06-05 NOTE — Telephone Encounter (Signed)
TORSEMIDE 20 MG PO TABS -needs refill [place], [place]

## 2014-06-17 ENCOUNTER — Other Ambulatory Visit: Payer: Self-pay | Admitting: *Deleted

## 2014-06-17 MED ORDER — POTASSIUM CHLORIDE CRYS ER 20 MEQ PO TBCR: 20.0000 meq | EXTENDED_RELEASE_TABLET | Freq: Every day | ORAL | Status: DC

## 2014-10-07 ENCOUNTER — Other Ambulatory Visit: Payer: Self-pay | Admitting: Radiology

## 2014-10-07 DIAGNOSIS — I6523 Occlusion and stenosis of bilateral carotid arteries: Secondary | ICD-10-CM

## 2014-10-14 ENCOUNTER — Encounter (INDEPENDENT_AMBULATORY_CARE_PROVIDER_SITE_OTHER): Payer: 59

## 2014-10-14 DIAGNOSIS — I6523 Occlusion and stenosis of bilateral carotid arteries: Secondary | ICD-10-CM | POA: Diagnosis not present

## 2014-10-19 ENCOUNTER — Telehealth: Payer: Self-pay | Admitting: *Deleted

## 2014-10-19 NOTE — Telephone Encounter (Signed)
Pt made aware, forwarded Dr. Pleas Koch

## 2014-10-19 NOTE — Telephone Encounter (Signed)
-----   Message from Herminio Commons, MD sent at 10/16/2014  9:24 AM EST ----- Moderate RICA and mild LICA stenosis. Follow with Dopplers periodically.

## 2014-11-03 ENCOUNTER — Ambulatory Visit (INDEPENDENT_AMBULATORY_CARE_PROVIDER_SITE_OTHER): Payer: 59 | Admitting: Cardiovascular Disease

## 2014-11-03 ENCOUNTER — Encounter: Payer: Self-pay | Admitting: Cardiovascular Disease

## 2014-11-03 VITALS — BP 121/74 | HR 67 | Ht 62.0 in | Wt 251.0 lb

## 2014-11-03 DIAGNOSIS — I255 Ischemic cardiomyopathy: Secondary | ICD-10-CM

## 2014-11-03 DIAGNOSIS — I5022 Chronic systolic (congestive) heart failure: Secondary | ICD-10-CM

## 2014-11-03 DIAGNOSIS — I25810 Atherosclerosis of coronary artery bypass graft(s) without angina pectoris: Secondary | ICD-10-CM

## 2014-11-03 DIAGNOSIS — Z951 Presence of aortocoronary bypass graft: Secondary | ICD-10-CM

## 2014-11-03 DIAGNOSIS — E785 Hyperlipidemia, unspecified: Secondary | ICD-10-CM

## 2014-11-03 DIAGNOSIS — I639 Cerebral infarction, unspecified: Secondary | ICD-10-CM

## 2014-11-03 DIAGNOSIS — I6523 Occlusion and stenosis of bilateral carotid arteries: Secondary | ICD-10-CM

## 2014-11-03 DIAGNOSIS — I10 Essential (primary) hypertension: Secondary | ICD-10-CM

## 2014-11-03 NOTE — Patient Instructions (Signed)
Continue all current medications. Your physician wants you to follow up in: 6 months.  You will receive a reminder letter in the mail one-two months in advance.  If you don't receive a letter, please call our office to schedule the follow up appointment   

## 2014-11-03 NOTE — Progress Notes (Signed)
Patient ID: Erica [name], female   DOB: [birth year], 64 y.o.   MRN: [id number]      SUBJECTIVE: The patient is here for routine cardiovascular followup. In summary, she is a 64 year old female with a h/o an MI on October 01, 2012. She then underwent a 3 vessel CABG (10-08-2012) with a saphenous vein graft to the first obtuse marginal, a saphenous vein graft to the first diagonal via a y graft off of the graft to the first obtuse marginal, and a left internal mammary artery to the left anterior descending coronary artery.  She has mildly reduced LV systolic function (EF 07-37%), grade II diastolic dysfunction, and mild to moderate mitral and tricuspid regurgitation as per echocardiogram performed on 10/23/13.  She also has type 2 diabetes mellitus as well as hypertension and dyslipidemia. 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 She was placed on Plavix by the neurologist.  Most recent carotid Dopplers showed moderate right internal carotid artery stenosis and mild left internal carotid artery stenosis.  She wore a monitor which showed no evidence of atrial fibrillation over 2 years ago.  She is taking torsemide for leg edema, along with potassium and magnesium supplements.   Wt 251 lbs today. 249 lbs on 04/30/14.  She is feeling well, and denies chest pain, shortness of breath, palpitations, orthopnea and paroxysmal nocturnal dyspnea.  She is planning on taking a trip with her eldest grandson to [place], [place], at the end of May.   Review of Systems: As per "subjective", otherwise negative.  Allergies  Allergen Reactions  . Codeine Nausea Only and Other (See Comments)    headache    Current Outpatient Prescriptions  Medication Sig Dispense Refill  . cholecalciferol (VITAMIN D) 1000 UNITS tablet Take 1,000 Units by mouth daily.    . clopidogrel (PLAVIX) 75 MG tablet Take 1 tablet (75 mg total) by mouth daily. 30 tablet 6    . colestipol (COLESTID) 1 G tablet 1 PO 30 MINS PRIOR TO BREAKFAST AND SUPPER. 60 tablet 11  . insulin NPH-regular (NOVOLIN 70/30) (70-30) 100 UNIT/ML injection Inject 20-50 Units into the skin 2 (two) times daily with a meal.     . lisinopril (PRINIVIL,ZESTRIL) 20 MG tablet Take 20 mg by mouth daily.    . Magnesium 400 MG CAPS Take 1 capsule by mouth daily. 30 capsule 6  . metFORMIN (GLUCOPHAGE) 1000 MG tablet Take 1,000 mg by mouth 2 (two) times daily with a meal.    . metoprolol tartrate (LOPRESSOR) 25 MG tablet Take 12.5 mg by mouth 2 (two) times daily.    . Multiple Vitamin (MULTIVITAMIN) tablet Take 1 tablet by mouth daily.    Marland Kitchen omeprazole (PRILOSEC) 20 MG capsule Take 20 mg by mouth daily.    . potassium chloride SA (K-DUR,KLOR-CON) 20 MEQ tablet Take 1 tablet (20 mEq total) by mouth daily. 30 tablet 6  . simvastatin (ZOCOR) 40 MG tablet Take 40 mg by mouth every evening.    Marland Kitchen tiZANidine (ZANAFLEX) 4 MG tablet Take 4 mg by mouth daily.     Marland Kitchen torsemide (DEMADEX) 20 MG tablet Take 1 tablet (20 mg total) by mouth daily. 30 tablet 6   No current facility-administered medications for this visit.    Past Medical History  Diagnosis Date  . CAD (coronary artery disease)   . MI (myocardial infarction) FEB 2014    Acute MI of anterior wall  . Pulmonary edema   . Chest pain   .  DM (diabetes mellitus)   . HTN (hypertension)   . Stroke 2010 RIGHT SIDED WEAKNESS  . Colon cancer   . Carcinoid tumor     Past Surgical History  Procedure Laterality Date  . Cholecystectomy    . Cardiac catheterization    . Tubal ligation    . Shoulder surgery    . Coronary artery bypass graft      History   Social History  . Marital Status: Legally Separated    Spouse Name: N/A  . Number of Children: N/A  . Years of Education: N/A   Occupational History  . Not on file.   Social History Main Topics  . Smoking status: Never Smoker   . Smokeless tobacco: Never Used  . Alcohol Use: No  . Drug  Use: No  . Sexual Activity: Not on file   Other Topics Concern  . Not on file   Social History Narrative     Filed Vitals:   11/03/14 1041  BP: 121/74  Pulse: 67  Height: 5\' 2"  (1.575 m)  Weight: 251 lb (113.853 kg)    PHYSICAL EXAM General: NAD HEENT: Normal. Neck: No JVD, no thyromegaly. Lungs: Clear to auscultation bilaterally with normal respiratory effort. CV: Nondisplaced PMI. Regular rate and rhythm, normal S1/S2, no S3/S4, no murmur. Trace periankle edema. No carotid bruit. Normal pedal pulses.  Abdomen: Soft, nontender,obese, no distention.  Neurologic: Alert and oriented x 3.  Psych: Normal affect. Skin: Normal. Musculoskeletal: Weakness of right arm and leg. Extremities: No clubbing or cyanosis.   ECG: Most recent ECG reviewed.      ASSESSMENT AND PLAN: 1. CAD s/p 3-vessel CABG: Stable ischemic heart disease. Continue metoprolol, ACEI, Plavix, and Zocor.  2. Ischemic cardiomyopathy/acute on chronic systolic heart failure: Euvolemic and stable. I will continue torsemide 20 mg daily. For her prior hypokalemia and hypomagnesemia, I will continue KCl 20 meq daily and magnesium oxide 400 mg daily respectively. I will continue lisinopril and metoprolol.  3. Carotid artery stenosis: Moderate right and mild left carotid disease. Continue Plavix.  4. Essential ypertension: Controlled on current therapy. No changes. 5. Hyperlipidemia: Obtain copy of most recent lipids from PCP performed one month ago. Continue simvastatin 40 mg. 6. Valvular heart disease: Presently stable. No evidence of pulmonary edema.  7. 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 Oneida Alar). 5-HIAA normal. Controlled with colestipol.  8. Type 2 diabetes: On insulin and metformin.  Dispo: f/u 6 months.   Kate Sable, M.D., F.A.C.C.

## 2014-11-10 ENCOUNTER — Encounter: Payer: Self-pay | Admitting: *Deleted

## 2014-12-31 ENCOUNTER — Other Ambulatory Visit: Payer: Self-pay | Admitting: Gastroenterology

## 2014-12-31 ENCOUNTER — Other Ambulatory Visit: Payer: Self-pay | Admitting: Cardiovascular Disease

## 2015-01-01 NOTE — Telephone Encounter (Signed)
Patient is overdue for follow up with Dr. Oneida Alar.

## 2015-01-11 ENCOUNTER — Encounter: Payer: Self-pay | Admitting: General Practice

## 2015-01-11 NOTE — Telephone Encounter (Signed)
Patient is scheduled to see [facility] 7/7 at 9:15 am, letter mailed

## 2015-01-14 ENCOUNTER — Other Ambulatory Visit: Payer: Self-pay | Admitting: Cardiovascular Disease

## 2015-02-11 ENCOUNTER — Other Ambulatory Visit: Payer: Self-pay

## 2015-02-11 ENCOUNTER — Encounter: Payer: Self-pay | Admitting: Gastroenterology

## 2015-02-11 ENCOUNTER — Ambulatory Visit (INDEPENDENT_AMBULATORY_CARE_PROVIDER_SITE_OTHER): Payer: 59 | Admitting: Gastroenterology

## 2015-02-11 VITALS — BP 126/40 | HR 68 | Temp 97.6°F | Ht 62.0 in | Wt 252.8 lb

## 2015-02-11 DIAGNOSIS — R197 Diarrhea, unspecified: Secondary | ICD-10-CM

## 2015-02-11 DIAGNOSIS — C7A00 Malignant carcinoid tumor of unspecified site: Secondary | ICD-10-CM

## 2015-02-11 MED ORDER — NA SULFATE-K SULFATE-MG SULF 17.5-3.13-1.6 GM/177ML PO SOLN: 1.0000 | ORAL | Status: DC

## 2015-02-11 NOTE — Progress Notes (Signed)
Subjective:    Patient ID: Erica [name], female    DOB: 1951-04-20, 64 y.o.   MRN: [id number]  Curlene Labrum, MD  HPI Having watery stools. BMs: 1-2 GOOD DAY, AND BAD DAY 3-5 DAYS. BAD DAYS: 2-3 TIMES A WEEK. HAVING ACCIDENTS. CARRIES EXTRA CLOTHES. LAST TIME SEEN MAY 2015: DIDN'T SEE [facility]. SINCE LAST YEAR: SX A LITTLE BETTER ON MEDICATION. BEFORE COULDN'T LEAVE THE HOUSE. NO PROBLEMSW ITH PREP.  PT DENIES FEVER, CHILLS, HEMATOCHEZIA, HEMATEMESIS, nausea, vomiting, melena, CHEST PAIN, SHORTNESS OF BREATH,  constipation, abdominal pain, problems swallowing, problems with sedation, OR heartburn or indigestion.    Past Medical History  Diagnosis Date  . CAD (coronary artery disease)   . MI (myocardial infarction) FEB 2014    Acute MI of anterior wall  . Pulmonary edema   . Chest pain   . DM (diabetes mellitus)   . HTN (hypertension)   . Stroke 2010 RIGHT SIDED WEAKNESS  . Colon cancer   . Carcinoid tumor    Past Surgical History  Procedure Laterality Date  . Cholecystectomy    . Cardiac catheterization    . Tubal ligation    . Shoulder surgery    . Coronary artery bypass graft      Allergies  Allergen Reactions  . Codeine Nausea Only and Other (See Comments)    headache    Current Outpatient Prescriptions  Medication Sig Dispense Refill  . cholecalciferol (VITAMIN D) 1000 UNITS tablet Take 1,000 Units by mouth daily.    . clopidogrel (PLAVIX) 75 MG tablet Take 1 tablet (75 mg total) by mouth daily.    . insulin NPH-regular (NOVOLIN 70/30) (70-30) 100 UNIT/ML injection Inject 50 Units into the skin 2 (two) times daily with a meal. 50 units in am / 30 units at night    . lisinopril (PRINIVIL,ZESTRIL) 20 MG tablet Take 20 mg by mouth daily.    . Magnesium 400 MG CAPS Take 1 capsule by mouth daily.    . metFORMIN (GLUCOPHAGE) 1000 MG tablet Take 1,000 mg by mouth 2 (two) times daily with a meal.    . metoprolol tartrate (LOPRESSOR) 25 MG tablet  Take 12.5 mg by mouth 2 (two) times daily.    Marland Kitchen MICRONIZED COLESTIPOL HCL 1 G tablet TAKE 1 TABLET BY MOUTH 30 MINUTES BEFORE BREAKFAST AND SUPPER.    . Multiple Vitamin (MULTIVITAMIN) tablet Take 1 tablet by mouth daily.    Marland Kitchen omeprazole (PRILOSEC) 20 MG capsule Take 20 mg by mouth daily.    . potassium chloride SA (K-DUR,KLOR-CON) 20 MEQ tablet TAKE 1 TABLET BY MOUTH DAILY    . simvastatin (ZOCOR) 40 MG tablet Take 40 mg by mouth every evening.    Marland Kitchen tiZANidine (ZANAFLEX) 4 MG tablet Take 4 mg by mouth daily.  QHS   . torsemide (DEMADEX) 20 MG tablet TAKE 1 TABLET BY MOUTH EVERY DAY     Review of Systems PER HPI OTHERWISE ALL SYSTEMS ARE NEGATIVE.     Objective:   Physical Exam  Constitutional: She is oriented to person, place, and time. She appears well-developed and well-nourished. No distress.  HENT:  Head: Normocephalic and atraumatic.  Mouth/Throat: Oropharynx is clear and moist. No oropharyngeal exudate.  Eyes: Pupils are equal, round, and reactive to light. No scleral icterus.  Neck: Normal range of motion. Neck supple.  Cardiovascular: Normal rate, regular rhythm and normal heart sounds.   Pulmonary/Chest: Effort normal and breath sounds normal. No respiratory distress.  Abdominal: Soft. Bowel sounds are normal. She exhibits no distension. There is no tenderness.  Musculoskeletal: She exhibits no edema.  WALKS ASSISTED WITH A CANE. Has brace on left lower extremity.  Lymphadenopathy:    She has no cervical adenopathy.  Neurological: She is alert and oriented to person, place, and time.  NO  NEW FOCAL DEFICITS   Psychiatric: She has a normal mood and affect.  Vitals reviewed.         Assessment & Plan:

## 2015-02-11 NOTE — Assessment & Plan Note (Addendum)
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 WEIGHT STABLE.   REFER TO CANCER CTR CHECK STOOL AND BLOOD TODAY: CHROMOGRANIN A. TCS WITH RANDOM Bx-DISCUSSED PROCEDURE, BENEFITS, & RISKS: < 1% chance of medication reaction, bleeding, perforation, or rupture of spleen/liver. HOLD INSULIN AM OF PROCEDURE HALF INSULIN NIGHT BEFORE TCS CONTINUE GLUCOPHAGE HOLD DEMADEX ON AM OF PROCEDURE. MAY CONTINUE KCL. FOLLOW UP IN 2 MOS. CONSIDER HBT FOR SIBO.

## 2015-02-11 NOTE — Progress Notes (Signed)
CC'ED TO PCP 

## 2015-02-11 NOTE — Patient Instructions (Addendum)
I WILL REFER YOU TO REFER TO [place].  CHECK STOOL AND BLOOD TODAY: CHROMOGRANIN A.  WE WILL SCHEDULE A COLONOSCOPY WITH SUPREP. FOLLOW A FULL LIQUID DIET THE DAY BEFORE YOUR COLONOSCOPY. SEE INFO BELOW. HALF INSULIN NIGHT BEFORE YOUR COLONOSCOPY. HOLD INSULIN ON MORNING OF YOUR COLONOSCOPY. CONTINUE GLUCOPHAGE HOLD DEMADEX ON MORNING OF YOUR COLONOSCOPY. YOU MAY CONTINUE POTASSIUM.  CONTINUE QUESTRAN. FOLLOW UP IN 2 MOS.    Full Liquid Diet A high-calorie, high-protein supplement should be used to meet your nutritional requirements when the full liquid diet is continued for more than 2 or 3 days. If this diet is to be used for an extended period of time (more than 7 days), a multivitamin should be considered.  Breads and Starches  Allowed: None are allowed except crackers WHOLE OR pureed (made into a thick, smooth soup) in soup.   Avoid: Any others.    Potatoes/Pasta/Rice  Allowed: ANY ITEM AS A SOUP OR SMALL PLATE OF MASHED POTATOES.       Vegetables  Allowed: Strained tomato or vegetable juice. Vegetables pureed in soup.   Avoid: Any others.    Fruit  Allowed: Any strained fruit juices and fruit drinks. Include 1 serving of citrus or vitamin C-enriched fruit juice daily.   Avoid: Any others.  Meat and Meat Substitutes  Allowed: Egg  Avoid: Any meat, fish, or fowl. All cheese.  Milk  Allowed: Milk beverages, including milk shakes and instant breakfast mixes. Smooth yogurt.   Avoid: Any others. Avoid dairy products if not tolerated.    Soups and Combination Foods  Allowed: Broth, strained cream soups. Strained, broth-based soups.   Avoid: Any others.    Desserts and Sweets  Allowed: flavored gelatin,plain ice cream, sherbet, smooth pudding, junket, fruit ices, frozen ice pops, pudding pops,, frozen fudge pops, chocolate syrup. Sugar, honey, jelly, syrup.   Avoid: Any others.  Fats and Oils  Allowed: Margarine, butter, cream, sour  cream, oils.   Avoid: Any others.  Beverages  Allowed: All.   Avoid: None.  Condiments  Allowed: Iodized salt, pepper, spices, flavorings. Cocoa powder.   Avoid: Any others.    SAMPLE MEAL PLAN Breakfast   cup orange juice.   1 OR 2 EGGS   1 cup  milk.   1 cup beverage (coffee or tea).   Cream or sugar, if desired.    Midmorning Snack  2 SCRAMBLED OR HARD BOILED EGG   Lunch  1 cup cream soup.    cup fruit juice.   1 cup milk.    cup custard.   1 cup beverage (coffee or tea).   Cream or sugar, if desired.    Midafternoon Snack  1 cup milk shake.  Dinner  1 cup cream soup.    cup fruit juice.   1 cup milk.    cup pudding.   1 cup beverage (coffee or tea).   Cream or sugar, if desired.  Evening Snack  1 cup supplement.  To increase calories, add sugar, cream, butter, or margarine if possible. Nutritional supplements will also increase the total calories.

## 2015-02-12 ENCOUNTER — Other Ambulatory Visit: Payer: Self-pay | Admitting: Gastroenterology

## 2015-02-13 LAB — CLOSTRIDIUM DIFFICILE BY PCR: Toxigenic C. Difficile by PCR: NOT DETECTED

## 2015-02-15 LAB — GIARDIA ANTIGEN: Giardia Screen (EIA): NEGATIVE

## 2015-02-17 LAB — CHROMOGRANIN A: CHROMOGRANIN A: 120 ng/mL — AB (ref <=15)

## 2015-02-18 ENCOUNTER — Telehealth: Payer: Self-pay | Admitting: Gastroenterology

## 2015-02-18 NOTE — Telephone Encounter (Addendum)
PLEASE CALL PT. HER STOOL STUDIES ARE NEGATIVE. HER CHROMOGRANIN A IS STILL ELEVATED. SHE NEEDS TO SEE HER CANCER DOCS AT [facility]. WE WILL SEE HER JUL 27 FOR HER COLONOSCOPY FOR RANDOM BIOPSIES AND ASSESS FOR CARCINOID TUMOR.

## 2015-02-19 ENCOUNTER — Telehealth: Payer: Self-pay

## 2015-02-19 NOTE — Telephone Encounter (Signed)
Pt is aware of results. 

## 2015-02-19 NOTE — Telephone Encounter (Signed)
REVIEWED. NO NEED TO HOLD PLAVIX.

## 2015-02-19 NOTE — Telephone Encounter (Signed)
Pt is calling to make sure that she does not need to hold her Plavix for the TCS. Please advise

## 2015-02-22 NOTE — Telephone Encounter (Signed)
Noted and she is aware 

## 2015-03-03 ENCOUNTER — Ambulatory Visit (HOSPITAL_COMMUNITY)
Admission: RE | Admit: 2015-03-03 | Discharge: 2015-03-03 | Disposition: A | Discharge status: Home / Self Care | Payer: Medicare Other | Source: Ambulatory Visit | Attending: Gastroenterology | Admitting: Gastroenterology

## 2015-03-03 ENCOUNTER — Encounter (HOSPITAL_COMMUNITY)
Admission: RE | Disposition: A | Discharge status: Home / Self Care | Payer: Self-pay | Source: Ambulatory Visit | Attending: Gastroenterology

## 2015-03-03 ENCOUNTER — Encounter (HOSPITAL_COMMUNITY): Payer: Self-pay

## 2015-03-03 DIAGNOSIS — Z951 Presence of aortocoronary bypass graft: Secondary | ICD-10-CM | POA: Insufficient documentation

## 2015-03-03 DIAGNOSIS — Z79899 Other long term (current) drug therapy: Secondary | ICD-10-CM | POA: Insufficient documentation

## 2015-03-03 DIAGNOSIS — D123 Benign neoplasm of transverse colon: Secondary | ICD-10-CM | POA: Diagnosis not present

## 2015-03-03 DIAGNOSIS — I10 Essential (primary) hypertension: Secondary | ICD-10-CM | POA: Diagnosis not present

## 2015-03-03 DIAGNOSIS — K5730 Diverticulosis of large intestine without perforation or abscess without bleeding: Secondary | ICD-10-CM | POA: Insufficient documentation

## 2015-03-03 DIAGNOSIS — E119 Type 2 diabetes mellitus without complications: Secondary | ICD-10-CM | POA: Diagnosis not present

## 2015-03-03 DIAGNOSIS — Z794 Long term (current) use of insulin: Secondary | ICD-10-CM | POA: Diagnosis not present

## 2015-03-03 DIAGNOSIS — K648 Other hemorrhoids: Secondary | ICD-10-CM | POA: Insufficient documentation

## 2015-03-03 DIAGNOSIS — R197 Diarrhea, unspecified: Secondary | ICD-10-CM | POA: Diagnosis not present

## 2015-03-03 HISTORY — PX: COLONOSCOPY: SHX5424

## 2015-03-03 LAB — GLUCOSE, CAPILLARY: Glucose-Capillary: 197 mg/dL — ABNORMAL HIGH (ref 65–99)

## 2015-03-03 SURGERY — COLONOSCOPY
Anesthesia: Moderate Sedation

## 2015-03-03 MED ORDER — MEPERIDINE HCL 100 MG/ML IJ SOLN
INTRAMUSCULAR | Status: DC | PRN
  Administered 2015-03-03 (×2): 25 mg via INTRAVENOUS

## 2015-03-03 MED ORDER — MIDAZOLAM HCL 5 MG/5ML IJ SOLN
INTRAMUSCULAR | Status: AC
  Filled 2015-03-03: qty 10

## 2015-03-03 MED ORDER — STERILE WATER FOR IRRIGATION IR SOLN
Status: DC | PRN
  Administered 2015-03-03: 10:00:00

## 2015-03-03 MED ORDER — MIDAZOLAM HCL 5 MG/5ML IJ SOLN
INTRAMUSCULAR | Status: DC | PRN
  Administered 2015-03-03 (×2): 2 mg via INTRAVENOUS
  Administered 2015-03-03: 1 mg via INTRAVENOUS

## 2015-03-03 MED ORDER — SODIUM CHLORIDE 0.9 % IV SOLN
INTRAVENOUS | Status: DC
  Administered 2015-03-03: 09:00:00 via INTRAVENOUS

## 2015-03-03 MED ORDER — MEPERIDINE HCL 100 MG/ML IJ SOLN
INTRAMUSCULAR | Status: AC
  Filled 2015-03-03: qty 2

## 2015-03-03 NOTE — H&P (View-Only) (Signed)
Subjective:    Patient ID: Erica [name], female    DOB: 06-29-1951, 64 y.o.   MRN: [id number]  Curlene Labrum, MD  HPI Having watery stools. BMs: 1-2 GOOD DAY, AND BAD DAY 3-5 DAYS. BAD DAYS: 2-3 TIMES A WEEK. HAVING ACCIDENTS. CARRIES EXTRA CLOTHES. LAST TIME SEEN MAY 2015: DIDN'T SEE DR. [facility]. SINCE LAST YEAR: SX A LITTLE BETTER ON MEDICATION. BEFORE COULDN'T LEAVE THE HOUSE. NO PROBLEMSW ITH PREP.  PT DENIES FEVER, CHILLS, HEMATOCHEZIA, HEMATEMESIS, nausea, vomiting, melena, CHEST PAIN, SHORTNESS OF BREATH,  constipation, abdominal pain, problems swallowing, problems with sedation, OR heartburn or indigestion.    Past Medical History  Diagnosis Date  . CAD (coronary artery disease)   . MI (myocardial infarction) FEB 2014    Acute MI of anterior wall  . Pulmonary edema   . Chest pain   . DM (diabetes mellitus)   . HTN (hypertension)   . Stroke 2010 RIGHT SIDED WEAKNESS  . Colon cancer   . Carcinoid tumor    Past Surgical History  Procedure Laterality Date  . Cholecystectomy    . Cardiac catheterization    . Tubal ligation    . Shoulder surgery    . Coronary artery bypass graft      Allergies  Allergen Reactions  . Codeine Nausea Only and Other (See Comments)    headache    Current Outpatient Prescriptions  Medication Sig Dispense Refill  . cholecalciferol (VITAMIN D) 1000 UNITS tablet Take 1,000 Units by mouth daily.    . clopidogrel (PLAVIX) 75 MG tablet Take 1 tablet (75 mg total) by mouth daily.    . insulin NPH-regular (NOVOLIN 70/30) (70-30) 100 UNIT/ML injection Inject 50 Units into the skin 2 (two) times daily with a meal. 50 units in am / 30 units at night    . lisinopril (PRINIVIL,ZESTRIL) 20 MG tablet Take 20 mg by mouth daily.    . Magnesium 400 MG CAPS Take 1 capsule by mouth daily.    . metFORMIN (GLUCOPHAGE) 1000 MG tablet Take 1,000 mg by mouth 2 (two) times daily with a meal.    . metoprolol tartrate (LOPRESSOR) 25 MG tablet  Take 12.5 mg by mouth 2 (two) times daily.    Marland Kitchen MICRONIZED COLESTIPOL HCL 1 G tablet TAKE 1 TABLET BY MOUTH 30 MINUTES BEFORE BREAKFAST AND SUPPER.    . Multiple Vitamin (MULTIVITAMIN) tablet Take 1 tablet by mouth daily.    Marland Kitchen omeprazole (PRILOSEC) 20 MG capsule Take 20 mg by mouth daily.    . potassium chloride SA (K-DUR,KLOR-CON) 20 MEQ tablet TAKE 1 TABLET BY MOUTH DAILY    . simvastatin (ZOCOR) 40 MG tablet Take 40 mg by mouth every evening.    Marland Kitchen tiZANidine (ZANAFLEX) 4 MG tablet Take 4 mg by mouth daily.  QHS   . torsemide (DEMADEX) 20 MG tablet TAKE 1 TABLET BY MOUTH EVERY DAY     Review of Systems PER HPI OTHERWISE ALL SYSTEMS ARE NEGATIVE.     Objective:   Physical Exam  Constitutional: She is oriented to person, place, and time. She appears well-developed and well-nourished. No distress.  HENT:  Head: Normocephalic and atraumatic.  Mouth/Throat: Oropharynx is clear and moist. No oropharyngeal exudate.  Eyes: Pupils are equal, round, and reactive to light. No scleral icterus.  Neck: Normal range of motion. Neck supple.  Cardiovascular: Normal rate, regular rhythm and normal heart sounds.   Pulmonary/Chest: Effort normal and breath sounds normal. No respiratory distress.  Abdominal: Soft. Bowel sounds are normal. She exhibits no distension. There is no tenderness.  Musculoskeletal: She exhibits no edema.  WALKS ASSISTED WITH A CANE. Has brace on left lower extremity.  Lymphadenopathy:    She has no cervical adenopathy.  Neurological: She is alert and oriented to person, place, and time.  NO  NEW FOCAL DEFICITS   Psychiatric: She has a normal mood and affect.  Vitals reviewed.         Assessment & Plan:

## 2015-03-03 NOTE — Op Note (Signed)
[facility] [address] [place], [address]   COLONOSCOPY PROCEDURE REPORT  PATIENT: Erica, [name]  MR#: [id number] BIRTHDATE: 1950/11/13 , 37  yrs. old GENDER: female ENDOSCOPIST: Danie Binder, MD REFERRED XT:[id number] Burdine, M.D. PROCEDURE DATE:  2015-03-06 PROCEDURE:   Colonoscopy with cold biopsy polypectomy and Colonoscopy with biopsy INDICATIONS:chronic diarrhea. MEDICATIONS: Versed 5 mg IV and Demerol 50 mg IV  DESCRIPTION OF PROCEDURE:    Physical exam was performed.  Informed consent was obtained from the patient after explaining the benefits, risks, and alternatives to procedure.  The patient was connected to monitor and placed in left lateral position. Continuous oxygen was provided by nasal cannula and IV medicine administered through an indwelling cannula.  After administration of sedation and rectal exam, the patients rectum was intubated and the EC-[id number] ([id number])  colonoscope was advanced under direct visualization to the ileum.  The scope was removed slowly by carefully examining the color, texture, anatomy, and integrity mucosa on the way out.  The patient was recovered in endoscopy and discharged home in satisfactory condition. Estimated blood loss is zero unless otherwise noted in this procedure report.    COLON FINDINGS: The examined terminal ileum appeared to be normal. , A sessile polyp measuring 4 mm in size was found in the transverse colon.  A polypectomy was performed with cold forceps. , There was mild diverticulosis noted in the sigmoid colon with associated tortuosity.  , and Small internal hemorrhoids were found.  PREP QUALITY: excellent.  CECAL W/D TIME: 15       minutes COMPLICATIONS: None  ENDOSCOPIC IMPRESSION: 1.   ONE COLON POLYP REMOVED 2.   Mild diverticulosis in the sigmoid colon 3.   Small internal hemorrhoids  RECOMMENDATIONS: REFRRAL TO [place] WITHIN THE NEXT 2-3 WEEKS. FOLLOW A HIGH  FIBER DIET. AWAIT BIOPSY RESULTS. FOLLOW UP IN SEP 2016. Next colonoscopy in 5-10 years.    _______________________________ eSignedDanie Binder, MD 03/06/2015 11:03 AM   CPT CODES: ICD CODES:  The ICD and CPT codes recommended by this software are interpretations from the data that the clinical staff has captured with the software.  The verification of the translation of this report to the ICD and CPT codes and modifiers is the sole responsibility of the health care institution and practicing physician where this report was generated.  [employer] will not be held responsible for the validity of the ICD and CPT codes included on this report.  AMA assumes no liability for data contained or not contained herein. CPT is a Designer, television/film set of the [employer].

## 2015-03-03 NOTE — Telephone Encounter (Signed)
PT NEEDS REFERRAL TO [facility] FOR AN APPT WITHIN THE NEXT 2-3 WEEKS, Dx: CARCINOID, ELEVATED CHROMOGRANIN A, DIARRHEA.

## 2015-03-03 NOTE — Interval H&P Note (Signed)
History and Physical Interval Note:  03/03/2015 10:06 AM  Erica [name]  has presented today for surgery, with the diagnosis of diarrhea  The various methods of treatment have been discussed with the patient and family. After consideration of risks, benefits and other options for treatment, the patient has consented to  Procedure(s) with comments: COLONOSCOPY (N/A) - 945 as a surgical intervention .  The patient's history has been reviewed, patient examined, no change in status, stable for surgery.  I have reviewed the patient's chart and labs.  Questions were answered to the patient's satisfaction.     [employer]

## 2015-03-03 NOTE — Telephone Encounter (Signed)
I called them and they are going to work on her referral

## 2015-03-03 NOTE — Discharge Instructions (Signed)
You had 1 small polyp removed. You have internal hemorrhoids and diverticulosis IN YOUR LEFT COLON. I BIOPSIED YOUR COLON.   I CONTACTED [facility]. THEY WILL GET YOU AN APPT WITHIN THE NEXT 2-3 WEEKS.  FOLLOW A HIGH FIBER DIET. AVOID ITEMS THAT CAUSE BLOATING. SEE INFO BELOW.  YOUR BIOPSY RESULTS WILL BE AVAILABLE IN MY CHART AFTER JUL 29 AND MY OFFICE WILL CONTACT YOU IN 10-14 DAYS WITH YOUR RESULTS.   FOLLOW UP IN SEP 2016.  Next colonoscopy in 5-10 years.   Colonoscopy Care After Read the instructions outlined below and refer to this sheet in the next week. These discharge instructions provide you with general information on caring for yourself after you leave the hospital. While your treatment has been planned according to the most current medical practices available, unavoidable complications occasionally occur. If you have any problems or questions after discharge, call DR. Michaelle Bottomley, [phone].  ACTIVITY  You may resume your regular activity, but move at a slower pace for the next 24 hours.   Take frequent rest periods for the next 24 hours.   Walking will help get rid of the air and reduce the bloated feeling in your belly (abdomen).   No driving for 24 hours (because of the medicine (anesthesia) used during the test).   You may shower.   Do not sign any important legal documents or operate any machinery for 24 hours (because of the anesthesia used during the test).    NUTRITION  Drink plenty of fluids.   You may resume your normal diet as instructed by your doctor.   Begin with a light meal and progress to your normal diet. Heavy or fried foods are harder to digest and may make you feel sick to your stomach (nauseated).   Avoid alcoholic beverages for 24 hours or as instructed.    MEDICATIONS  You may resume your normal medications.   WHAT YOU CAN EXPECT TODAY  Some feelings of bloating in the abdomen.   Passage of more gas than usual.    Spotting of blood in your stool or on the toilet paper  .  IF YOU HAD POLYPS REMOVED DURING THE COLONOSCOPY:  Eat a soft diet IF YOU HAVE NAUSEA, BLOATING, ABDOMINAL PAIN, OR VOMITING.    FINDING OUT THE RESULTS OF YOUR TEST Not all test results are available during your visit. DR. Oneida Alar WILL CALL YOU WITHIN 14 DAYS OF YOUR PROCEDUE WITH YOUR RESULTS. Do not assume everything is normal if you have not heard from DR. Christyann Manolis, CALL HER OFFICE AT [phone].  SEEK IMMEDIATE MEDICAL ATTENTION AND CALL THE OFFICE: [phone] IF:  You have more than a spotting of blood in your stool.   Your belly is swollen (abdominal distention).   You are nauseated or vomiting.   You have a temperature over 101F.   You have abdominal pain or discomfort that is severe or gets worse throughout the day.  Polyps, Colon  A polyp is extra tissue that grows inside your body. Colon polyps grow in the large intestine. The large intestine, also called the colon, is part of your digestive system. It is a long, hollow tube at the end of your digestive tract where your body makes and stores stool. Most polyps are not dangerous. They are benign. This means they are not cancerous. But over time, some types of polyps can turn into cancer. Polyps that are smaller than a pea are usually not harmful. But larger polyps could someday become  or may already be cancerous. To be safe, doctors remove all polyps and test them.   PREVENTION There is not one sure way to prevent polyps. You might be able to lower your risk of getting them if you:  Eat more fruits and vegetables and less fatty food.   Do not smoke.   Avoid alcohol.   Exercise every day.   Lose weight if you are overweight.   Eating more calcium and folate can also lower your risk of getting polyps. Some foods that are rich in calcium are milk, cheese, and broccoli. Some foods that are rich in folate are chickpeas, kidney beans, and spinach.    High-Fiber Diet A high-fiber diet changes your normal diet to include more whole grains, legumes, fruits, and vegetables. Changes in the diet involve replacing refined carbohydrates with unrefined foods. The calorie level of the diet is essentially unchanged. The Dietary Reference Intake (recommended amount) for adult males is 38 grams per day. For adult females, it is 25 grams per day. Pregnant and lactating women should consume 28 grams of fiber per day. Fiber is the intact part of a plant that is not broken down during digestion. Functional fiber is fiber that has been isolated from the plant to provide a beneficial effect in the body. PURPOSE  Increase stool bulk.   Ease and regulate bowel movements.   Lower cholesterol.  REDUCE RISK OF COLON CANCER  INDICATIONS THAT YOU NEED MORE FIBER  Constipation and hemorrhoids.   Uncomplicated diverticulosis (intestine condition) and irritable bowel syndrome.   Weight management.   As a protective measure against hardening of the arteries (atherosclerosis), diabetes, and cancer.   GUIDELINES FOR INCREASING FIBER IN THE DIET  Start adding fiber to the diet slowly. A gradual increase of about 5 more grams (2 slices of whole-wheat bread, 2 servings of most fruits or vegetables, or 1 bowl of high-fiber cereal) per day is best. Too rapid an increase in fiber may result in constipation, flatulence, and bloating.   Drink enough water and fluids to keep your urine clear or pale yellow. Water, juice, or caffeine-free drinks are recommended. Not drinking enough fluid may cause constipation.   Eat a variety of high-fiber foods rather than one type of fiber.   Try to increase your intake of fiber through using high-fiber foods rather than fiber pills or supplements that contain small amounts of fiber.   The goal is to change the types of food eaten. Do not supplement your present diet with high-fiber foods, but replace foods in your present diet.     INCLUDE A VARIETY OF FIBER SOURCES  Replace refined and processed grains with whole grains, canned fruits with fresh fruits, and incorporate other fiber sources. White rice, white breads, and most bakery goods contain little or no fiber.   Brown whole-grain rice, buckwheat oats, and many fruits and vegetables are all good sources of fiber. These include: broccoli, Brussels sprouts, cabbage, cauliflower, beets, sweet potatoes, white potatoes (skin on), carrots, tomatoes, eggplant, squash, berries, fresh fruits, and dried fruits.   Cereals appear to be the richest source of fiber. Cereal fiber is found in whole grains and bran. Bran is the fiber-rich outer coat of cereal grain, which is largely removed in refining. In whole-grain cereals, the bran remains. In breakfast cereals, the largest amount of fiber is found in those with "bran" in their names. The fiber content is sometimes indicated on the label.   You may need to include additional fruits  and vegetables each day.   In baking, for 1 cup white flour, you may use the following substitutions:   1 cup whole-wheat flour minus 2 tablespoons.   1/2 cup white flour plus 1/2 cup whole-wheat flour.   Diverticulosis Diverticulosis is a common condition that develops when small pouches (diverticula) form in the wall of the colon. The risk of diverticulosis increases with age. It happens more often in people who eat a low-fiber diet. Most individuals with diverticulosis have no symptoms. Those individuals with symptoms usually experience belly (abdominal) pain, constipation, or loose stools (diarrhea).  HOME CARE INSTRUCTIONS  Increase the amount of fiber in your diet as directed by your caregiver or dietician. This may reduce symptoms of diverticulosis.   Drink at least 6 to 8 glasses of water each day to prevent constipation.   Try not to strain when you have a bowel movement.   Avoiding nuts and seeds to prevent complications is NOT  NECESSARY.   FOODS HAVING HIGH FIBER CONTENT INCLUDE:  Fruits. Apple, peach, pear, tangerine, raisins, prunes.   Vegetables. Brussels sprouts, asparagus, broccoli, cabbage, carrot, cauliflower, romaine lettuce, spinach, summer squash, tomato, winter squash, zucchini.   Starchy Vegetables. Baked beans, kidney beans, lima beans, split peas, lentils, potatoes (with skin).   Grains. Whole wheat bread, brown rice, bran flake cereal, plain oatmeal, white rice, shredded wheat, bran muffins.   SEEK IMMEDIATE MEDICAL CARE IF:  You develop increasing pain or severe bloating.   You have an oral temperature above 101F.   You develop vomiting or bowel movements that are bloody or black.   Hemorrhoids Hemorrhoids are dilated (enlarged) veins around the rectum. Sometimes clots will form in the veins. This makes them swollen and painful. These are called thrombosed hemorrhoids. Causes of hemorrhoids include:  Constipation.   Straining to have a bowel movement.   HEAVY LIFTING HOME CARE INSTRUCTIONS  Eat a well balanced diet and drink 6 to 8 glasses of water every day to avoid constipation. You may also use a bulk laxative.   Avoid straining to have bowel movements.   Keep anal area dry and clean.   Do not use a donut shaped pillow or sit on the toilet for long periods. This increases blood pooling and pain.   Move your bowels when your body has the urge; this will require less straining and will decrease pain and pressure.

## 2015-03-04 NOTE — Telephone Encounter (Signed)
Pt has an appointment on 03/17/15 @ 11:30

## 2015-03-05 ENCOUNTER — Encounter (HOSPITAL_COMMUNITY): Payer: Self-pay | Admitting: Gastroenterology

## 2015-03-06 ENCOUNTER — Telehealth: Payer: Self-pay | Admitting: Gastroenterology

## 2015-03-06 NOTE — Telephone Encounter (Signed)
Please call pt. She had a simple adenoma removed.   SEE [facility] THE NEXT 2-3 WEEKS.  SHE SHOULD SCHEDULE HYDROGEN BREATH TEST TO CHECK FOR SMALL BOWEL BACTERIAL OVERGROWTH AS A CAUSE FOR HER DIARRHEA.   FOLLOW A HIGH FIBER DIET. AVOID ITEMS THAT CAUSE BLOATING.   FOLLOW UP IN SEP 2016 E30 DIARRHEA.  Next colonoscopy in 5-10 years.

## 2015-03-08 ENCOUNTER — Other Ambulatory Visit: Payer: Self-pay

## 2015-03-08 DIAGNOSIS — R197 Diarrhea, unspecified: Secondary | ICD-10-CM

## 2015-03-08 NOTE — Telephone Encounter (Signed)
APPT MADE AND ON RECALL FOR TCS °

## 2015-03-08 NOTE — Telephone Encounter (Signed)
HBT appt is set for 03/18/2015 @ 7:00am

## 2015-03-08 NOTE — Telephone Encounter (Signed)
Mailed instructions

## 2015-03-08 NOTE — Telephone Encounter (Signed)
Pt is aware of results and states it is ok to arrange her HBT. Pt has appt with the cancer center on 03/17/2015 @ 11:30am

## 2015-03-18 ENCOUNTER — Encounter (HOSPITAL_COMMUNITY): Payer: Self-pay | Admitting: Gastroenterology

## 2015-03-18 ENCOUNTER — Encounter (HOSPITAL_COMMUNITY)
Admission: RE | Disposition: A | Discharge status: Home / Self Care | Payer: Self-pay | Source: Ambulatory Visit | Attending: Gastroenterology

## 2015-03-18 ENCOUNTER — Telehealth: Payer: Self-pay | Admitting: Gastroenterology

## 2015-03-18 ENCOUNTER — Ambulatory Visit (HOSPITAL_COMMUNITY)
Admission: RE | Admit: 2015-03-18 | Discharge: 2015-03-18 | Disposition: A | Discharge status: Home / Self Care | Payer: Medicare Other | Source: Ambulatory Visit | Attending: Gastroenterology | Admitting: Gastroenterology

## 2015-03-18 DIAGNOSIS — I10 Essential (primary) hypertension: Secondary | ICD-10-CM | POA: Diagnosis not present

## 2015-03-18 DIAGNOSIS — Z885 Allergy status to narcotic agent status: Secondary | ICD-10-CM | POA: Insufficient documentation

## 2015-03-18 DIAGNOSIS — Z8673 Personal history of transient ischemic attack (TIA), and cerebral infarction without residual deficits: Secondary | ICD-10-CM | POA: Insufficient documentation

## 2015-03-18 DIAGNOSIS — R197 Diarrhea, unspecified: Secondary | ICD-10-CM | POA: Insufficient documentation

## 2015-03-18 DIAGNOSIS — E785 Hyperlipidemia, unspecified: Secondary | ICD-10-CM | POA: Diagnosis not present

## 2015-03-18 HISTORY — PX: BACTERIAL OVERGROWTH TEST: SHX5739

## 2015-03-18 SURGERY — BREATH TEST, FOR INTESTINAL BACTERIAL OVERGROWTH

## 2015-03-18 MED ORDER — LACTULOSE 10 GM/15ML PO SOLN
ORAL | Status: AC
  Administered 2015-03-18: 25 g via OROMUCOSAL
  Filled 2015-03-18: qty 60

## 2015-03-18 NOTE — Progress Notes (Signed)
Dr. Oneida Alar office notified patient has completed test and been discharged. Spoke with Photographer.

## 2015-03-18 NOTE — Progress Notes (Signed)
No beans, bran or high fiber cereal the day before the procedure? no NPO except for water 12 hours before procedure? yes No smoking, sleeping or vigorous exercising for at least 30 before procedure? no Recent antibiotic use and/or diarrhea? no  If yes, physician notified.  Time Baseline  30 mins 45 mins 60 mins 75 mins 90 mins 105 mins 120 mins 135 mins 150 mins 165 mins 180 mins  H2-ppm   1      1    0     0  1 1 9 18  51  63   52   34

## 2015-03-18 NOTE — Telephone Encounter (Signed)
Altha Harm, one of the nurses from Short Stay called to let us know that patient has completed her hydrogen breath test and has been discharged home.

## 2015-03-19 NOTE — Telephone Encounter (Signed)
REVIEWED-NO ADDITIONAL RECOMMENDATIONS. 

## 2015-03-25 NOTE — Procedures (Signed)
PRE-OPERATIVE DIAGNOSIS:  DIARRHEA, EVALUATE FOR SBBO  POST-OPERATIVE DIAGNOSIS:  NORMAL HYDROGEN BREATH TEST   PROCEDURE:  Procedure(s): HYDROGEN BREATH TEST  SURGEON:  Surgeon(s): Dorothyann Peng, MD  MEDICATIONS USED:  LACTULOSE  FINDINGS: BREATHALYZER- 0 MIN(1 PPM), (0-18 PPM, 0-120 MINS), FIRST PEAK (63 PPM, 150 MINS), TROUGH (52 PPM 165 MINS), SECOND PEAK (NONE)  DIAGNOSIS:  NORMAL HYDROGEN BREATH TEST V. METHANE PRODUCER  PLAN OF CARE:  1. LOW FAT DIET 2. TUMS ONE TID 3. OPV IN SEP 2016

## 2015-03-25 NOTE — Telephone Encounter (Addendum)
PLEASE CALL PT. HER HBT IS NORMAL.  SHE SHOULD:  1. FOLLOW LOW FAT DIET. 2. CHEW ONE TUMS WITH MEALS 3 TIMES A DAY 3. Follow up in SEP 2016.

## 2015-03-26 NOTE — Telephone Encounter (Signed)
Unable to reach pt by pone. Printed the info and mailed to her.

## 2015-03-26 NOTE — Telephone Encounter (Signed)
OV MADE °

## 2015-03-29 ENCOUNTER — Telehealth: Payer: Self-pay | Admitting: Gastroenterology

## 2015-03-29 NOTE — Telephone Encounter (Signed)
See 03/18/2015 phone note. Pt is aware of her results.

## 2015-03-29 NOTE — Telephone Encounter (Signed)
Pt called today saying she had missed a call on Friday. Please call patient at [phone]

## 2015-04-14 ENCOUNTER — Ambulatory Visit (INDEPENDENT_AMBULATORY_CARE_PROVIDER_SITE_OTHER): Payer: Medicare Other | Admitting: Gastroenterology

## 2015-04-14 ENCOUNTER — Encounter: Payer: Self-pay | Admitting: Gastroenterology

## 2015-04-14 VITALS — BP 136/64 | HR 74 | Temp 97.6°F | Ht 61.0 in | Wt 252.6 lb

## 2015-04-14 DIAGNOSIS — D3A00 Benign carcinoid tumor of unspecified site: Secondary | ICD-10-CM

## 2015-04-14 DIAGNOSIS — K219 Gastro-esophageal reflux disease without esophagitis: Secondary | ICD-10-CM | POA: Diagnosis not present

## 2015-04-14 DIAGNOSIS — R197 Diarrhea, unspecified: Secondary | ICD-10-CM

## 2015-04-14 MED ORDER — OMEPRAZOLE 20 MG PO CPDR: 20.0000 mg | DELAYED_RELEASE_CAPSULE | Freq: Every day | ORAL | Status: DC

## 2015-04-14 MED ORDER — RANITIDINE HCL 150 MG PO TABS: 300.0000 mg | ORAL_TABLET | Freq: Every day | ORAL | Status: DC

## 2015-04-14 NOTE — Progress Notes (Signed)
cc'ed to pcp °

## 2015-04-14 NOTE — Assessment & Plan Note (Signed)
SYMPTOMS IMPROVED ON COLESTID AND PRN TUMS.   CONTINUE COLESTID FOLLOW UP IN JAN OR MAR 2017

## 2015-04-14 NOTE — Progress Notes (Signed)
Subjective:    Patient ID: Erica [name], female    DOB: 04-25-1951, 64 y.o.   MRN: [id number]  Curlene Labrum, MD  HPI SAW DR. KARB. HEARTBURN: 3-4 TIMES A WEEK WHILE ON TUMS BUT WHEN TAKES OMEPRAZOLE AT BEDTIME HEARTBURN FAIRLY WELL CONTROLLED. ONLY 2 BOUTS OF DIARRHEA SINCE LAST VISIT: ONE AFTER HBT LASTED 3 DAYS. TAKING COLESTID. USING TUMS AS NEEDED FOR HEARTBURN.   PT DENIES FEVER, CHILLS, HEMATOCHEZIA, HEMATEMESIS, nausea, vomiting, melena, CHEST PAIN, SHORTNESS OF BREATH,  abdominal pain, OR problems swallowing.  Past Medical History  Diagnosis Date  . CAD (coronary artery disease)   . Pulmonary edema   . Chest pain   . DM (diabetes mellitus)   . HTN (hypertension)   . Carcinoid tumor   . MI (myocardial infarction) FEB 2014    Acute MI of anterior wall  . Stroke 2010 RIGHT SIDED WEAKNESS  . Colon cancer 2008   Past Surgical History  Procedure Laterality Date  . Cholecystectomy    . Tubal ligation    . Shoulder surgery    . 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 Cardiac catheterization  08/2008  . Colonoscopy N/A 03/03/2015    SLF: 1. One colon polyp removed 2. Mild diverticulosis in the sigmoid colon 3. Small internal hemorrhoids  . Bacterial overgrowth test N/A 03/18/2015    Procedure: BACTERIAL OVERGROWTH TEST;  Surgeon: Danie Binder, MD;  Location: [facility];  Service: Endoscopy;  Laterality: N/A;  0800    Allergies  Allergen Reactions  . Codeine Nausea Only and Other (See Comments)    headache    Current Outpatient Prescriptions  Medication Sig Dispense Refill  . cholecalciferol (VITAMIN D) 1000 UNITS tablet Take 1,000 Units by mouth daily.    . clopidogrel (PLAVIX) 75 MG tablet Take 1 tablet (75 mg total) by mouth daily.    . insulin NPH-regular (NOVOLIN 70/30) (70-30) 100 UNIT/ML injection Inject 50 Units into the skin 2 (two) times daily with a meal. 50 units in am / 30 units at night    . lisinopril (PRINIVIL,ZESTRIL) 20 MG tablet Take 20 mg  by mouth daily.    . Magnesium 400 MG CAPS Take 1 capsule by mouth daily.    . metFORMIN (GLUCOPHAGE) 1000 MG tablet Take 1,000 mg by mouth 2 (two) times daily with a meal.    . metoprolol tartrate (LOPRESSOR) 25 MG tablet Take 12.5 mg by mouth 2 (two) times daily.    Marland Kitchen MICRONIZED COLESTIPOL HCL 1 G tablet TAKE 1 TABLET BY MOUTH 30 MINUTES BEFORE BREAKFAST AND SUPPER.    . Multiple Vitamin (MULTIVITAMIN) tablet Take 1 tablet by mouth daily.    . ON HOLD BY DR. DAROVSKY   . potassium chloride SA (K-DUR,KLOR-CON) 20 MEQ tablet TAKE 1 TABLET BY MOUTH DAILY    . simvastatin (ZOCOR) 40 MG tablet Take 40 mg by mouth every evening.    Marland Kitchen tiZANidine (ZANAFLEX) 4 MG tablet Take 4 mg by mouth daily.     Marland Kitchen torsemide (DEMADEX) 20 MG tablet TAKE 1 TABLET BY MOUTH EVERY DAY     Review of Systems PER HPI OTHERWISE ALL SYSTEMS ARE NEGATIVE.     Objective:   Physical Exam  Constitutional: She is oriented to person, place, and time. She appears well-developed and well-nourished. No distress.  HENT:  Head: Normocephalic and atraumatic.  Mouth/Throat: Oropharynx is clear and moist. No oropharyngeal exudate.  Eyes: Pupils are equal, round, and reactive to light.  No scleral icterus.  Neck: Normal range of motion. Neck supple.  Cardiovascular: Normal rate, regular rhythm and normal heart sounds.   Pulmonary/Chest: Effort normal and breath sounds normal. No respiratory distress.  Abdominal: Soft. Bowel sounds are normal. She exhibits no distension. There is no tenderness.  Musculoskeletal: She exhibits no edema.  BRACE on right lower leg  Lymphadenopathy:    She has no cervical adenopathy.  Neurological: She is alert and oriented to person, place, and time.  NO  NEW FOCAL DEFICITS, R HEMIPARESIS, WALKS ASSISTED WITH A CANE  Psychiatric: She has a normal mood and affect.  Vitals reviewed.         Assessment & Plan:

## 2015-04-14 NOTE — Patient Instructions (Addendum)
ADD ZANTAC AT BEDTIME FOR 2 NIGHTS THEN OFF ONE NIGHT UNTIL YOU CAN RE-START OMPERAZOLE. ZANTAC IS MOST EFFECTIVE WHEN USED OFF AND ON INSTEAD OF EVERY DAY FOR 30 DAYS.  CONTINUE TUMS AND COLESTID.  YOU SHOULD RE-START OMEPRAZOLE AFTER YOUR ONCOLOGY WORKUP IS COMPLETE.  AVOID FOOD THAT TRIGGERS REFLUX. SEE INFO BELOW.   NEXT OUTPATIENT VISIT IN JAN OR MAR 2017.   Lifestyle and home remedies TO MANAGE REFLUX/CHEST PAIN  You may eliminate or reduce the frequency of heartburn by making the following lifestyle changes:  . Control your weight. Being overweight is a major risk factor for heartburn and GERD. Excess pounds put pressure on your abdomen, pushing up your stomach and causing acid to back up into your esophagus.   . Eat smaller meals. 4 TO 6 MEALS A DAY. This reduces pressure on the lower esophageal sphincter, helping to prevent the valve from opening and acid from washing back into your esophagus.   Erica [name] your belt. Clothes that fit tightly around your waist put pressure on your abdomen and the lower esophageal sphincter.   . Eliminate heartburn triggers. Everyone has specific triggers. Common triggers such as fatty or fried foods, spicy food, tomato sauce, carbonated beverages, alcohol, chocolate, mint, garlic, onion, caffeine and nicotine may make heartburn worse.   Marland Kitchen Avoid stooping or bending. Tying your shoes is OK. Bending over for longer periods to weed your garden isn't, especially soon after eating.   . Don't lie down after a meal. Wait at least three to four hours after eating before going to bed, and don't lie down right after eating.   Marland Kitchen PUT THE HEAD OF YOUR BED ON 6 INCH BLOCKS.   Alternative medicine . Several home remedies exist for treating GERD, but they provide only temporary relief. They include drinking baking soda (sodium bicarbonate) added to water or drinking other fluids such as baking soda mixed with cream of tartar and water.  . Although these  liquids create temporary relief by neutralizing, washing away or buffering acids, eventually they aggravate the situation by adding gas and fluid to your stomach, increasing pressure and causing more acid reflux. Further, adding more sodium to your diet may increase your blood pressure and add stress to your heart, and excessive bicarbonate ingestion can alter the acid-base balance in your body.   Low-Fat Diet BREADS, CEREALS, PASTA, RICE, DRIED PEAS, AND BEANS These products are high in carbohydrates and most are low in fat. Therefore, they can be increased in the diet as substitutes for fatty foods. They too, however, contain calories and should not be eaten in excess. Cereals can be eaten for snacks as well as for breakfast.   FRUITS AND VEGETABLES It is good to eat fruits and vegetables. Besides being sources of fiber, both are rich in vitamins and some minerals. They help you get the daily allowances of these nutrients. Fruits and vegetables can be used for snacks and desserts.  MEATS Limit lean meat, chicken, [place], and fish to no more than 6 ounces per day. Beef, Pork, and Lamb Use lean cuts of beef, pork, and lamb. Lean cuts include:  Extra-lean ground beef.  Arm roast.  Sirloin tip.  Center-cut ham.  Round steak.  Loin chops.  Rump roast.  Tenderloin.  Trim all fat off the outside of meats before cooking. It is not necessary to severely decrease the intake of red meat, but lean choices should be made. Lean meat is rich in protein and contains a  highly absorbable form of iron. Premenopausal women, in particular, should avoid reducing lean red meat because this could increase the risk for low red blood cells (iron-deficiency anemia).  Chicken and [place] These are good sources of protein. The fat of poultry can be reduced by removing the skin and underlying fat layers before cooking. Chicken and [place] can be substituted for lean red meat in the diet. Poultry should not be fried or  covered with high-fat sauces. Fish and Shellfish Fish is a good source of protein. Shellfish contain cholesterol, but they usually are low in saturated fatty acids. The preparation of fish is important. Like chicken and [place], they should not be fried or covered with high-fat sauces. EGGS Egg whites contain no fat or cholesterol. They can be eaten often. Try 1 to 2 egg whites instead of whole eggs in recipes or use egg substitutes that do not contain yolk. MILK AND DAIRY PRODUCTS Use skim or 1% milk instead of 2% or whole milk. Decrease whole milk, natural, and processed cheeses. Use nonfat or low-fat (2%) cottage cheese or low-fat cheeses made from vegetable oils. Choose nonfat or low-fat (1 to 2%) yogurt. Experiment with evaporated skim milk in recipes that call for heavy cream. Substitute low-fat yogurt or low-fat cottage cheese for sour cream in dips and salad dressings. Have at least 2 servings of low-fat dairy products, such as 2 glasses of skim (or 1%) milk each day to help get your daily calcium intake. FATS AND OILS Reduce the total intake of fats, especially saturated fat. Butterfat, lard, and beef fats are high in saturated fat and cholesterol. These should be avoided as much as possible. Vegetable fats do not contain cholesterol, but certain vegetable fats, such as coconut oil, palm oil, and palm kernel oil are very high in saturated fats. These should be limited. These fats are often used in bakery goods, processed foods, popcorn, oils, and nondairy creamers. Vegetable shortenings and some peanut butters contain hydrogenated oils, which are also saturated fats. Read the labels on these foods and check for saturated vegetable oils. Unsaturated vegetable oils and fats do not raise blood cholesterol. However, they should be limited because they are fats and are high in calories. Total fat should still be limited to 30% of your daily caloric intake. Desirable liquid vegetable oils are corn oil,  cottonseed oil, olive oil, canola oil, safflower oil, soybean oil, and sunflower oil. Peanut oil is not as good, but small amounts are acceptable. Buy a heart-healthy tub margarine that has no partially hydrogenated oils in the ingredients. Mayonnaise and salad dressings often are made from unsaturated fats, but they should also be limited because of their high calorie and fat content. Seeds, nuts, peanut butter, olives, and avocados are high in fat, but the fat is mainly the unsaturated type. These foods should be limited mainly to avoid excess calories and fat. OTHER EATING TIPS Snacks  Most sweets should be limited as snacks. They tend to be rich in calories and fats, and their caloric content outweighs their nutritional value. Some good choices in snacks are graham crackers, melba toast, soda crackers, bagels (no egg), English muffins, fruits, and vegetables. These snacks are preferable to snack crackers, [place] fries, TORTILLA CHIPS, and POTATO chips. Popcorn should be air-popped or cooked in small amounts of liquid vegetable oil. Desserts Eat fruit, low-fat yogurt, and fruit ices instead of pastries, cake, and cookies. Sherbet, angel food cake, gelatin dessert, frozen low-fat yogurt, or other frozen products that do not  contain saturated fat (pure fruit juice bars, frozen ice pops) are also acceptable.  COOKING METHODS Choose those methods that use little or no fat. They include: Poaching.  Braising.  Steaming.  Grilling.  Baking.  Stir-frying.  Broiling.  Microwaving.  Foods can be cooked in a nonstick pan without added fat, or use a nonfat cooking spray in regular cookware. Limit fried foods and avoid frying in saturated fat. Add moisture to lean meats by using water, broth, cooking wines, and other nonfat or low-fat sauces along with the cooking methods mentioned above. Soups and stews should be chilled after cooking. The fat that forms on top after a few hours in the refrigerator should  be skimmed off. When preparing meals, avoid using excess salt. Salt can contribute to raising blood pressure in some people.  EATING AWAY FROM HOME Order entres, potatoes, and vegetables without sauces or butter. When meat exceeds the size of a deck of cards (3 to 4 ounces), the rest can be taken home for another meal. Choose vegetable or fruit salads and ask for low-calorie salad dressings to be served on the side. Use dressings sparingly. Limit high-fat toppings, such as bacon, crumbled eggs, cheese, sunflower seeds, and olives. Ask for heart-healthy tub margarine instead of butter.

## 2015-04-14 NOTE — Progress Notes (Signed)
ON RECALL  °

## 2015-04-14 NOTE — Assessment & Plan Note (Signed)
SYMPTOMS NOT CONTROLLED DUE TO PPI BEINGON HOLD.  ADD ZANTAC QHS 2 NIGHT SOFF ONE UNTIL SHE CAN RE-START PPI. ZANTAC HELP MOST WHEN USED AS NEEDED.  DISCUSSED BENEFITS, & RISKS OF PPI, AND MANAGEMENT OF GERD WITH AND WITHOUT PPI. RECOMMEND PT RE-START PPI AFTER ONCOLOGY WORKUP IS COMPLETE.  AVOID FOOD THAT TRIGGERS REFLUX. SEE INFO BELOW.   OUTPATIENT VISIT IN  JAN OR MAR 2017.

## 2015-04-14 NOTE — Assessment & Plan Note (Signed)
SEEN AND EVALUATED BY ONCOLOGY. FELTS CHROMOGRANIN A ELEVATED DUE TO PPI AND CRI.  PPI ON HOLD THEY WILL RE-ASSESS IN NOV 2016. FOLLOW UP IN4- 6 MOS.

## 2015-04-19 ENCOUNTER — Other Ambulatory Visit: Payer: Self-pay | Admitting: Cardiovascular Disease

## 2015-04-23 ENCOUNTER — Other Ambulatory Visit: Payer: Self-pay | Admitting: Cardiovascular Disease

## 2015-05-03 ENCOUNTER — Ambulatory Visit: Payer: Medicare Other | Admitting: Cardiovascular Disease

## 2015-05-18 ENCOUNTER — Ambulatory Visit (INDEPENDENT_AMBULATORY_CARE_PROVIDER_SITE_OTHER): Payer: Medicare Other | Admitting: Cardiovascular Disease

## 2015-05-18 ENCOUNTER — Encounter: Payer: Self-pay | Admitting: *Deleted

## 2015-05-18 ENCOUNTER — Encounter: Payer: Self-pay | Admitting: Cardiovascular Disease

## 2015-05-18 VITALS — BP 130/62 | HR 60 | Ht 61.0 in | Wt 246.0 lb

## 2015-05-18 DIAGNOSIS — I639 Cerebral infarction, unspecified: Secondary | ICD-10-CM

## 2015-05-18 DIAGNOSIS — I5022 Chronic systolic (congestive) heart failure: Secondary | ICD-10-CM | POA: Diagnosis not present

## 2015-05-18 DIAGNOSIS — I6523 Occlusion and stenosis of bilateral carotid arteries: Secondary | ICD-10-CM

## 2015-05-18 DIAGNOSIS — I25810 Atherosclerosis of coronary artery bypass graft(s) without angina pectoris: Secondary | ICD-10-CM | POA: Diagnosis not present

## 2015-05-18 DIAGNOSIS — R790 Abnormal level of blood mineral: Secondary | ICD-10-CM

## 2015-05-18 DIAGNOSIS — I10 Essential (primary) hypertension: Secondary | ICD-10-CM

## 2015-05-18 DIAGNOSIS — E785 Hyperlipidemia, unspecified: Secondary | ICD-10-CM

## 2015-05-18 DIAGNOSIS — E876 Hypokalemia: Secondary | ICD-10-CM

## 2015-05-18 NOTE — Patient Instructions (Signed)
Continue all current medications. Your physician wants you to follow up in:  1 year.  You will receive a reminder letter in the mail one-two months in advance.  If you don't receive a letter, please call our office to schedule the follow up appointment   

## 2015-05-18 NOTE — Progress Notes (Signed)
Patient ID: Erica [name], female   DOB: 1950/10/09, 64 y.o.   MRN: [id number]      SUBJECTIVE: The patient is here for routine cardiovascular followup. In summary, she is a 64 year old female with a h/o an MI on October 01, 2012. She then underwent a 3 vessel CABG (10-08-2012) with a saphenous vein graft to the first obtuse marginal, a saphenous vein graft to the first diagonal via a y graft off of the graft to the first obtuse marginal, and a left internal mammary artery to the left anterior descending coronary artery.  She has mildly reduced LV systolic function (EF 26-37%), grade II diastolic dysfunction, and mild to moderate mitral and tricuspid regurgitation as per echocardiogram performed on 10/23/13.  She also has type 2 diabetes mellitus as well as hypertension and dyslipidemia. 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 She was placed on Plavix by her neurologist.  Most recent carotid Dopplers showed moderate right internal carotid artery stenosis and mild left internal carotid artery stenosis.  She wore a monitor which showed no evidence of atrial fibrillation over 2 years ago.  She is taking torsemide for leg edema, along with potassium and magnesium supplements.   Wt 246 lbs today. 251 lbs on 11/03/14.  She is feeling well, and denies chest pain, shortness of breath, palpitations, orthopnea and paroxysmal nocturnal dyspnea.  Her only complaint is GERD after being taken off omeprazole by her oncologist and switched to Zantac.  ECG performed in the office today demonstrates sinus rhythm with a nonspecific T wave abnormality.   Review of Systems: As per "subjective", otherwise negative.  Allergies  Allergen Reactions  . Bee Venom   . Codeine Nausea Only and Other (See Comments)    headache    Current Outpatient Prescriptions  Medication Sig Dispense Refill  . cholecalciferol (VITAMIN D) 1000 UNITS tablet Take 1,000 Units  by mouth daily.    . clopidogrel (PLAVIX) 75 MG tablet Take 1 tablet (75 mg total) by mouth daily. 30 tablet 6  . insulin NPH-regular (NOVOLIN 70/30) (70-30) 100 UNIT/ML injection Inject 50 Units into the skin 2 (two) times daily with a meal. 50 units in am / 30 units at night    . lisinopril (PRINIVIL,ZESTRIL) 20 MG tablet Take 20 mg by mouth daily.    . Magnesium 400 MG CAPS Take 1 capsule by mouth daily. 30 capsule 6  . metFORMIN (GLUCOPHAGE) 1000 MG tablet Take 1,000 mg by mouth 2 (two) times daily with a meal.    . metoprolol tartrate (LOPRESSOR) 25 MG tablet Take 12.5 mg by mouth 2 (two) times daily.    Marland Kitchen MICRONIZED COLESTIPOL HCL 1 G tablet TAKE 1 TABLET BY MOUTH 30 MINUTES BEFORE BREAKFAST AND SUPPER. 60 tablet 6  . Multiple Vitamin (MULTIVITAMIN) tablet Take 1 tablet by mouth daily.    . potassium chloride SA (K-DUR,KLOR-CON) 20 MEQ tablet TAKE 1 TABLET BY MOUTH DAILY 30 tablet 3  . ranitidine (ZANTAC) 150 MG tablet Take 2 tablets (300 mg total) by mouth at bedtime. 120 tablet 1  . simvastatin (ZOCOR) 40 MG tablet Take 40 mg by mouth every evening.    Marland Kitchen tiZANidine (ZANAFLEX) 4 MG tablet Take 4 mg by mouth daily.     Marland Kitchen torsemide (DEMADEX) 20 MG tablet TAKE 1 TABLET BY MOUTH EVERY DAY 30 tablet 3   No current facility-administered medications for this visit.    Past Medical History  Diagnosis Date  . CAD (  coronary artery disease)   . Pulmonary edema   . Chest pain   . DM (diabetes mellitus) ([place])   . HTN (hypertension)   . Carcinoid tumor   . MI (myocardial infarction) ([place]) FEB 2014    Acute MI of anterior wall  . Stroke [facility]) 2010 RIGHT SIDED WEAKNESS  . Colon cancer ([place]) 2008    Past Surgical History  Procedure Laterality Date  . Cholecystectomy    . Tubal ligation    . Shoulder surgery    . 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 Cardiac catheterization  08/2008  . Colonoscopy N/A 03/03/2015    SLF: 1. One colon polyp removed 2. Mild diverticulosis in the sigmoid  colon 3. Small internal hemorrhoids  . Bacterial overgrowth test N/A 03/18/2015    Procedure: BACTERIAL OVERGROWTH TEST;  Surgeon: Danie Binder, MD;  Location: [facility];  Service: Endoscopy;  Laterality: N/A;  0800    Social History   Social History  . Marital Status: Divorced    Spouse Name: N/A  . Number of Children: N/A  . Years of Education: N/A   Occupational History  . Not on file.   Social History Main Topics  . Smoking status: Never Smoker   . Smokeless tobacco: Never Used  . Alcohol Use: No  . Drug Use: No  . Sexual Activity: Not on file   Other Topics Concern  . Not on file   Social History Narrative     Filed Vitals:   05/18/15 0834  BP: 130/62  Pulse: 60  Height: 5\' 1"  (1.549 m)  Weight: 246 lb (111.585 kg)  SpO2: 96%    PHYSICAL EXAM General: NAD HEENT: Normal. Neck: No JVD, no thyromegaly. Lungs: Clear to auscultation bilaterally with normal respiratory effort. CV: Nondisplaced PMI. Regular rate and rhythm, normal S1/S2, no S3/S4, no murmur. Trace periankle edema. No carotid bruit.  Abdomen: Soft, nontender,obese, no distention.  Neurologic: Alert and oriented x 3.  Psych: Normal affect. Skin: Normal. Musculoskeletal: Weakness of right arm and leg. Extremities: No clubbing or cyanosis.   ECG: Most recent ECG reviewed.      ASSESSMENT AND PLAN: 1. CAD s/p 3-vessel CABG: Stable ischemic heart disease. Continue metoprolol, ACEI, Plavix, and Zocor.   2. Ischemic cardiomyopathy/chronic systolic heart failure: Euvolemic and stable. Continue torsemide 20 mg daily. For her prior hypokalemia and hypomagnesemia, will continue KCl 20 meq daily and magnesium oxide 400 mg daily respectively. Continue lisinopril and metoprolol.   3. Carotid artery stenosis: Moderate right and mild left carotid disease. Continue Plavix.   4. Essential hypertension: Controlled on current therapy. No changes.  5. Hyperlipidemia: Obtain copy of most recent  lipids from PCP. Continue simvastatin 40 mg.  6. Valvular heart disease: Presently stable. No evidence of pulmonary edema.  Dispo: f/u 1 year.  Kate Sable, M.D., F.A.C.C.

## 2015-07-23 ENCOUNTER — Encounter: Payer: Self-pay | Admitting: Gastroenterology

## 2015-08-17 ENCOUNTER — Other Ambulatory Visit: Payer: Self-pay | Admitting: Cardiovascular Disease

## 2015-08-17 ENCOUNTER — Other Ambulatory Visit: Payer: Self-pay | Admitting: Gastroenterology

## 2015-08-26 DIAGNOSIS — H25813 Combined forms of age-related cataract, bilateral: Secondary | ICD-10-CM | POA: Diagnosis not present

## 2015-08-26 DIAGNOSIS — H40213 Acute angle-closure glaucoma, bilateral: Secondary | ICD-10-CM | POA: Diagnosis not present

## 2015-08-26 DIAGNOSIS — H402231 Chronic angle-closure glaucoma, bilateral, mild stage: Secondary | ICD-10-CM | POA: Diagnosis not present

## 2015-08-26 DIAGNOSIS — E119 Type 2 diabetes mellitus without complications: Secondary | ICD-10-CM | POA: Diagnosis not present

## 2015-08-26 DIAGNOSIS — H40033 Anatomical narrow angle, bilateral: Secondary | ICD-10-CM | POA: Diagnosis not present

## 2015-08-26 DIAGNOSIS — H524 Presbyopia: Secondary | ICD-10-CM | POA: Diagnosis not present

## 2015-08-26 DIAGNOSIS — H52223 Regular astigmatism, bilateral: Secondary | ICD-10-CM | POA: Diagnosis not present

## 2015-08-26 DIAGNOSIS — H5203 Hypermetropia, bilateral: Secondary | ICD-10-CM | POA: Diagnosis not present

## 2015-09-29 DIAGNOSIS — K219 Gastro-esophageal reflux disease without esophagitis: Secondary | ICD-10-CM | POA: Diagnosis not present

## 2015-09-29 DIAGNOSIS — I10 Essential (primary) hypertension: Secondary | ICD-10-CM | POA: Diagnosis not present

## 2015-09-29 DIAGNOSIS — Z87442 Personal history of urinary calculi: Secondary | ICD-10-CM | POA: Diagnosis not present

## 2015-09-29 DIAGNOSIS — E669 Obesity, unspecified: Secondary | ICD-10-CM | POA: Diagnosis not present

## 2015-09-29 DIAGNOSIS — Z8673 Personal history of transient ischemic attack (TIA), and cerebral infarction without residual deficits: Secondary | ICD-10-CM | POA: Diagnosis not present

## 2015-09-29 DIAGNOSIS — R109 Unspecified abdominal pain: Secondary | ICD-10-CM | POA: Diagnosis not present

## 2015-09-29 DIAGNOSIS — E119 Type 2 diabetes mellitus without complications: Secondary | ICD-10-CM | POA: Diagnosis not present

## 2015-09-29 DIAGNOSIS — N200 Calculus of kidney: Secondary | ICD-10-CM | POA: Diagnosis not present

## 2015-09-29 DIAGNOSIS — E7800 Pure hypercholesterolemia, unspecified: Secondary | ICD-10-CM | POA: Diagnosis not present

## 2015-09-29 DIAGNOSIS — D270 Benign neoplasm of right ovary: Secondary | ICD-10-CM | POA: Diagnosis not present

## 2015-10-01 DIAGNOSIS — I10 Essential (primary) hypertension: Secondary | ICD-10-CM | POA: Diagnosis not present

## 2015-10-01 DIAGNOSIS — E669 Obesity, unspecified: Secondary | ICD-10-CM | POA: Diagnosis not present

## 2015-10-01 DIAGNOSIS — Z87442 Personal history of urinary calculi: Secondary | ICD-10-CM | POA: Diagnosis not present

## 2015-10-01 DIAGNOSIS — Z8673 Personal history of transient ischemic attack (TIA), and cerebral infarction without residual deficits: Secondary | ICD-10-CM | POA: Diagnosis not present

## 2015-10-01 DIAGNOSIS — E119 Type 2 diabetes mellitus without complications: Secondary | ICD-10-CM | POA: Diagnosis not present

## 2015-10-01 DIAGNOSIS — D270 Benign neoplasm of right ovary: Secondary | ICD-10-CM | POA: Diagnosis not present

## 2015-10-01 DIAGNOSIS — R1011 Right upper quadrant pain: Secondary | ICD-10-CM | POA: Diagnosis not present

## 2015-10-01 DIAGNOSIS — R109 Unspecified abdominal pain: Secondary | ICD-10-CM | POA: Diagnosis not present

## 2015-10-01 DIAGNOSIS — E7800 Pure hypercholesterolemia, unspecified: Secondary | ICD-10-CM | POA: Diagnosis not present

## 2015-10-01 DIAGNOSIS — Z794 Long term (current) use of insulin: Secondary | ICD-10-CM | POA: Diagnosis not present

## 2015-10-01 DIAGNOSIS — Z79899 Other long term (current) drug therapy: Secondary | ICD-10-CM | POA: Diagnosis not present

## 2015-10-06 DIAGNOSIS — Z01419 Encounter for gynecological examination (general) (routine) without abnormal findings: Secondary | ICD-10-CM | POA: Diagnosis not present

## 2015-10-06 DIAGNOSIS — D369 Benign neoplasm, unspecified site: Secondary | ICD-10-CM | POA: Diagnosis not present

## 2015-10-08 DIAGNOSIS — C7A029 Malignant carcinoid tumor of the large intestine, unspecified portion: Secondary | ICD-10-CM | POA: Diagnosis not present

## 2015-10-08 DIAGNOSIS — I5022 Chronic systolic (congestive) heart failure: Secondary | ICD-10-CM | POA: Diagnosis not present

## 2015-10-08 DIAGNOSIS — H40003 Preglaucoma, unspecified, bilateral: Secondary | ICD-10-CM | POA: Diagnosis not present

## 2015-10-08 DIAGNOSIS — I10 Essential (primary) hypertension: Secondary | ICD-10-CM | POA: Diagnosis not present

## 2015-10-08 DIAGNOSIS — E119 Type 2 diabetes mellitus without complications: Secondary | ICD-10-CM | POA: Diagnosis not present

## 2015-10-08 DIAGNOSIS — H2511 Age-related nuclear cataract, right eye: Secondary | ICD-10-CM | POA: Diagnosis not present

## 2015-10-08 DIAGNOSIS — I240 Acute coronary thrombosis not resulting in myocardial infarction: Secondary | ICD-10-CM | POA: Diagnosis not present

## 2015-10-08 DIAGNOSIS — E782 Mixed hyperlipidemia: Secondary | ICD-10-CM | POA: Diagnosis not present

## 2015-10-15 DIAGNOSIS — I10 Essential (primary) hypertension: Secondary | ICD-10-CM | POA: Diagnosis not present

## 2015-10-15 DIAGNOSIS — I5022 Chronic systolic (congestive) heart failure: Secondary | ICD-10-CM | POA: Diagnosis not present

## 2015-10-15 DIAGNOSIS — I255 Ischemic cardiomyopathy: Secondary | ICD-10-CM | POA: Diagnosis not present

## 2015-10-15 DIAGNOSIS — I240 Acute coronary thrombosis not resulting in myocardial infarction: Secondary | ICD-10-CM | POA: Diagnosis not present

## 2015-10-15 DIAGNOSIS — S83241A Other tear of medial meniscus, current injury, right knee, initial encounter: Secondary | ICD-10-CM | POA: Diagnosis not present

## 2015-10-15 DIAGNOSIS — E782 Mixed hyperlipidemia: Secondary | ICD-10-CM | POA: Diagnosis not present

## 2015-10-15 DIAGNOSIS — E119 Type 2 diabetes mellitus without complications: Secondary | ICD-10-CM | POA: Diagnosis not present

## 2015-10-15 DIAGNOSIS — M1711 Unilateral primary osteoarthritis, right knee: Secondary | ICD-10-CM | POA: Diagnosis not present

## 2015-10-15 DIAGNOSIS — Z1389 Encounter for screening for other disorder: Secondary | ICD-10-CM | POA: Diagnosis not present

## 2015-10-15 DIAGNOSIS — N183 Chronic kidney disease, stage 3 (moderate): Secondary | ICD-10-CM | POA: Diagnosis not present

## 2015-10-18 ENCOUNTER — Other Ambulatory Visit: Payer: Self-pay | Admitting: Gastroenterology

## 2015-10-30 DIAGNOSIS — L720 Epidermal cyst: Secondary | ICD-10-CM | POA: Diagnosis not present

## 2015-10-30 DIAGNOSIS — E1165 Type 2 diabetes mellitus with hyperglycemia: Secondary | ICD-10-CM | POA: Diagnosis not present

## 2015-10-30 DIAGNOSIS — E039 Hypothyroidism, unspecified: Secondary | ICD-10-CM | POA: Diagnosis not present

## 2015-10-30 DIAGNOSIS — L03221 Cellulitis of neck: Secondary | ICD-10-CM | POA: Diagnosis not present

## 2015-11-01 DIAGNOSIS — E039 Hypothyroidism, unspecified: Secondary | ICD-10-CM | POA: Diagnosis not present

## 2015-11-01 DIAGNOSIS — L720 Epidermal cyst: Secondary | ICD-10-CM | POA: Diagnosis not present

## 2015-11-01 DIAGNOSIS — L728 Other follicular cysts of the skin and subcutaneous tissue: Secondary | ICD-10-CM | POA: Diagnosis not present

## 2015-11-01 DIAGNOSIS — E1165 Type 2 diabetes mellitus with hyperglycemia: Secondary | ICD-10-CM | POA: Diagnosis not present

## 2015-11-01 DIAGNOSIS — L03221 Cellulitis of neck: Secondary | ICD-10-CM | POA: Diagnosis not present

## 2015-11-03 DIAGNOSIS — L0211 Cutaneous abscess of neck: Secondary | ICD-10-CM | POA: Diagnosis not present

## 2015-11-29 DIAGNOSIS — L723 Sebaceous cyst: Secondary | ICD-10-CM | POA: Diagnosis not present

## 2015-12-02 DIAGNOSIS — H2511 Age-related nuclear cataract, right eye: Secondary | ICD-10-CM | POA: Diagnosis not present

## 2015-12-02 DIAGNOSIS — H401111 Primary open-angle glaucoma, right eye, mild stage: Secondary | ICD-10-CM | POA: Diagnosis not present

## 2015-12-02 DIAGNOSIS — H401131 Primary open-angle glaucoma, bilateral, mild stage: Secondary | ICD-10-CM | POA: Diagnosis not present

## 2015-12-07 DIAGNOSIS — R197 Diarrhea, unspecified: Secondary | ICD-10-CM | POA: Diagnosis not present

## 2015-12-07 DIAGNOSIS — K219 Gastro-esophageal reflux disease without esophagitis: Secondary | ICD-10-CM | POA: Diagnosis not present

## 2015-12-07 DIAGNOSIS — D3A026 Benign carcinoid tumor of the rectum: Secondary | ICD-10-CM | POA: Diagnosis not present

## 2015-12-07 DIAGNOSIS — R978 Other abnormal tumor markers: Secondary | ICD-10-CM | POA: Diagnosis not present

## 2015-12-13 DIAGNOSIS — H25813 Combined forms of age-related cataract, bilateral: Secondary | ICD-10-CM | POA: Diagnosis not present

## 2015-12-13 DIAGNOSIS — Z961 Presence of intraocular lens: Secondary | ICD-10-CM | POA: Diagnosis not present

## 2015-12-15 ENCOUNTER — Other Ambulatory Visit: Payer: Self-pay | Admitting: Nurse Practitioner

## 2015-12-16 DIAGNOSIS — R978 Other abnormal tumor markers: Secondary | ICD-10-CM | POA: Diagnosis not present

## 2015-12-16 DIAGNOSIS — K219 Gastro-esophageal reflux disease without esophagitis: Secondary | ICD-10-CM | POA: Diagnosis not present

## 2015-12-16 DIAGNOSIS — R197 Diarrhea, unspecified: Secondary | ICD-10-CM | POA: Diagnosis not present

## 2015-12-16 DIAGNOSIS — D3A026 Benign carcinoid tumor of the rectum: Secondary | ICD-10-CM | POA: Diagnosis not present

## 2015-12-27 DIAGNOSIS — Z7984 Long term (current) use of oral hypoglycemic drugs: Secondary | ICD-10-CM | POA: Diagnosis not present

## 2015-12-27 DIAGNOSIS — E119 Type 2 diabetes mellitus without complications: Secondary | ICD-10-CM | POA: Diagnosis not present

## 2015-12-27 DIAGNOSIS — Z87442 Personal history of urinary calculi: Secondary | ICD-10-CM | POA: Diagnosis not present

## 2015-12-27 DIAGNOSIS — I69351 Hemiplegia and hemiparesis following cerebral infarction affecting right dominant side: Secondary | ICD-10-CM | POA: Diagnosis not present

## 2015-12-27 DIAGNOSIS — Z79899 Other long term (current) drug therapy: Secondary | ICD-10-CM | POA: Diagnosis not present

## 2015-12-27 DIAGNOSIS — L723 Sebaceous cyst: Secondary | ICD-10-CM | POA: Diagnosis not present

## 2015-12-27 DIAGNOSIS — Z7902 Long term (current) use of antithrombotics/antiplatelets: Secondary | ICD-10-CM | POA: Diagnosis not present

## 2015-12-27 DIAGNOSIS — I252 Old myocardial infarction: Secondary | ICD-10-CM | POA: Diagnosis not present

## 2015-12-27 DIAGNOSIS — Z794 Long term (current) use of insulin: Secondary | ICD-10-CM | POA: Diagnosis not present

## 2015-12-27 DIAGNOSIS — I10 Essential (primary) hypertension: Secondary | ICD-10-CM | POA: Diagnosis not present

## 2015-12-28 DIAGNOSIS — C189 Malignant neoplasm of colon, unspecified: Secondary | ICD-10-CM | POA: Diagnosis not present

## 2015-12-28 DIAGNOSIS — E785 Hyperlipidemia, unspecified: Secondary | ICD-10-CM | POA: Diagnosis not present

## 2015-12-28 DIAGNOSIS — E119 Type 2 diabetes mellitus without complications: Secondary | ICD-10-CM | POA: Diagnosis not present

## 2015-12-28 DIAGNOSIS — R221 Localized swelling, mass and lump, neck: Secondary | ICD-10-CM | POA: Diagnosis not present

## 2015-12-28 DIAGNOSIS — L723 Sebaceous cyst: Secondary | ICD-10-CM | POA: Diagnosis not present

## 2015-12-28 DIAGNOSIS — L729 Follicular cyst of the skin and subcutaneous tissue, unspecified: Secondary | ICD-10-CM | POA: Diagnosis not present

## 2016-01-12 DIAGNOSIS — E782 Mixed hyperlipidemia: Secondary | ICD-10-CM | POA: Diagnosis not present

## 2016-01-12 DIAGNOSIS — E039 Hypothyroidism, unspecified: Secondary | ICD-10-CM | POA: Diagnosis not present

## 2016-01-12 DIAGNOSIS — E1165 Type 2 diabetes mellitus with hyperglycemia: Secondary | ICD-10-CM | POA: Diagnosis not present

## 2016-01-17 DIAGNOSIS — I10 Essential (primary) hypertension: Secondary | ICD-10-CM | POA: Diagnosis not present

## 2016-01-17 DIAGNOSIS — I240 Acute coronary thrombosis not resulting in myocardial infarction: Secondary | ICD-10-CM | POA: Diagnosis not present

## 2016-01-17 DIAGNOSIS — N183 Chronic kidney disease, stage 3 (moderate): Secondary | ICD-10-CM | POA: Diagnosis not present

## 2016-01-17 DIAGNOSIS — L720 Epidermal cyst: Secondary | ICD-10-CM | POA: Diagnosis not present

## 2016-01-17 DIAGNOSIS — E782 Mixed hyperlipidemia: Secondary | ICD-10-CM | POA: Diagnosis not present

## 2016-01-17 DIAGNOSIS — I255 Ischemic cardiomyopathy: Secondary | ICD-10-CM | POA: Diagnosis not present

## 2016-01-17 DIAGNOSIS — E1165 Type 2 diabetes mellitus with hyperglycemia: Secondary | ICD-10-CM | POA: Diagnosis not present

## 2016-01-17 DIAGNOSIS — I5022 Chronic systolic (congestive) heart failure: Secondary | ICD-10-CM | POA: Diagnosis not present

## 2016-02-01 DIAGNOSIS — Z0000 Encounter for general adult medical examination without abnormal findings: Secondary | ICD-10-CM | POA: Diagnosis not present

## 2016-02-01 DIAGNOSIS — E1165 Type 2 diabetes mellitus with hyperglycemia: Secondary | ICD-10-CM | POA: Diagnosis not present

## 2016-02-01 DIAGNOSIS — I110 Hypertensive heart disease with heart failure: Secondary | ICD-10-CM | POA: Diagnosis not present

## 2016-02-01 DIAGNOSIS — Z6841 Body Mass Index (BMI) 40.0 and over, adult: Secondary | ICD-10-CM | POA: Diagnosis not present

## 2016-02-01 DIAGNOSIS — I5022 Chronic systolic (congestive) heart failure: Secondary | ICD-10-CM | POA: Diagnosis not present

## 2016-02-01 DIAGNOSIS — Z794 Long term (current) use of insulin: Secondary | ICD-10-CM | POA: Diagnosis not present

## 2016-02-01 DIAGNOSIS — G8191 Hemiplegia, unspecified affecting right dominant side: Secondary | ICD-10-CM | POA: Diagnosis not present

## 2016-02-01 DIAGNOSIS — K219 Gastro-esophageal reflux disease without esophagitis: Secondary | ICD-10-CM | POA: Diagnosis not present

## 2016-03-14 ENCOUNTER — Other Ambulatory Visit: Payer: Self-pay | Admitting: Cardiovascular Disease

## 2016-04-13 ENCOUNTER — Other Ambulatory Visit: Payer: Self-pay | Admitting: Gastroenterology

## 2016-04-17 NOTE — Telephone Encounter (Signed)
Due OV with SLF only. RF omeprazole X 3 only.

## 2016-04-17 NOTE — Telephone Encounter (Signed)
Letter mailed to pt to call and schedule OV with Dr. Oneida Alar.

## 2016-04-20 ENCOUNTER — Other Ambulatory Visit: Payer: Self-pay | Admitting: Cardiovascular Disease

## 2016-04-20 DIAGNOSIS — E1165 Type 2 diabetes mellitus with hyperglycemia: Secondary | ICD-10-CM | POA: Diagnosis not present

## 2016-04-20 DIAGNOSIS — I10 Essential (primary) hypertension: Secondary | ICD-10-CM | POA: Diagnosis not present

## 2016-04-20 DIAGNOSIS — C7A029 Malignant carcinoid tumor of the large intestine, unspecified portion: Secondary | ICD-10-CM | POA: Diagnosis not present

## 2016-04-20 DIAGNOSIS — E039 Hypothyroidism, unspecified: Secondary | ICD-10-CM | POA: Diagnosis not present

## 2016-04-20 DIAGNOSIS — E559 Vitamin D deficiency, unspecified: Secondary | ICD-10-CM | POA: Diagnosis not present

## 2016-04-20 DIAGNOSIS — I240 Acute coronary thrombosis not resulting in myocardial infarction: Secondary | ICD-10-CM | POA: Diagnosis not present

## 2016-04-20 DIAGNOSIS — E782 Mixed hyperlipidemia: Secondary | ICD-10-CM | POA: Diagnosis not present

## 2016-04-20 DIAGNOSIS — I6523 Occlusion and stenosis of bilateral carotid arteries: Secondary | ICD-10-CM

## 2016-04-27 DIAGNOSIS — C7A029 Malignant carcinoid tumor of the large intestine, unspecified portion: Secondary | ICD-10-CM | POA: Diagnosis not present

## 2016-04-27 DIAGNOSIS — Z23 Encounter for immunization: Secondary | ICD-10-CM | POA: Diagnosis not present

## 2016-04-27 DIAGNOSIS — M25532 Pain in left wrist: Secondary | ICD-10-CM | POA: Diagnosis not present

## 2016-04-27 DIAGNOSIS — E119 Type 2 diabetes mellitus without complications: Secondary | ICD-10-CM | POA: Diagnosis not present

## 2016-04-27 DIAGNOSIS — I10 Essential (primary) hypertension: Secondary | ICD-10-CM | POA: Diagnosis not present

## 2016-04-27 DIAGNOSIS — Z6841 Body Mass Index (BMI) 40.0 and over, adult: Secondary | ICD-10-CM | POA: Diagnosis not present

## 2016-04-27 DIAGNOSIS — E559 Vitamin D deficiency, unspecified: Secondary | ICD-10-CM | POA: Diagnosis not present

## 2016-04-27 DIAGNOSIS — E782 Mixed hyperlipidemia: Secondary | ICD-10-CM | POA: Diagnosis not present

## 2016-05-03 DIAGNOSIS — M65832 Other synovitis and tenosynovitis, left forearm: Secondary | ICD-10-CM | POA: Diagnosis not present

## 2016-05-03 DIAGNOSIS — M25532 Pain in left wrist: Secondary | ICD-10-CM | POA: Diagnosis not present

## 2016-05-03 DIAGNOSIS — M25432 Effusion, left wrist: Secondary | ICD-10-CM | POA: Diagnosis not present

## 2016-05-03 DIAGNOSIS — S63072A Subluxation of distal end of left ulna, initial encounter: Secondary | ICD-10-CM | POA: Diagnosis not present

## 2016-05-10 ENCOUNTER — Ambulatory Visit: Payer: Medicare HMO

## 2016-05-10 DIAGNOSIS — I6523 Occlusion and stenosis of bilateral carotid arteries: Secondary | ICD-10-CM | POA: Diagnosis not present

## 2016-05-10 LAB — VAS US CAROTID
LCCAPSYS: 79 cm/s
LEFT ECA DIAS: 0 cm/s
LEFT VERTEBRAL DIAS: -15 cm/s
Left CCA dist dias: -11 cm/s
Left CCA dist sys: -68 cm/s
Left CCA prox dias: 0 cm/s
Left ICA dist dias: -17 cm/s
Left ICA dist sys: -71 cm/s
Left ICA prox dias: -14 cm/s
Left ICA prox sys: -106 cm/s
RCCAPDIAS: 0 cm/s
RIGHT ECA DIAS: 0 cm/s
RIGHT VERTEBRAL DIAS: -11 cm/s
Right CCA prox sys: 77 cm/s
Right cca dist sys: -120 cm/s

## 2016-05-15 ENCOUNTER — Other Ambulatory Visit: Payer: Self-pay | Admitting: Cardiovascular Disease

## 2016-05-15 ENCOUNTER — Other Ambulatory Visit: Payer: Self-pay | Admitting: Nurse Practitioner

## 2016-05-17 ENCOUNTER — Telehealth: Payer: Self-pay | Admitting: *Deleted

## 2016-05-17 DIAGNOSIS — H401132 Primary open-angle glaucoma, bilateral, moderate stage: Secondary | ICD-10-CM | POA: Diagnosis not present

## 2016-05-17 NOTE — Telephone Encounter (Signed)
Notes Recorded by Laurine Blazer, LPN on [id number] at 8:53 AM EDT Patient notified. Copy to pmd. 1 year follow up scheduled for 05/19/2016 with Dr. Bronson Ing. ------  Notes Recorded by Herminio Commons, MD on 05/11/2016 at 9:02 AM EDT Repeat in 1 year.

## 2016-05-19 ENCOUNTER — Ambulatory Visit (INDEPENDENT_AMBULATORY_CARE_PROVIDER_SITE_OTHER): Payer: Medicare HMO | Admitting: Cardiovascular Disease

## 2016-05-19 ENCOUNTER — Encounter: Payer: Self-pay | Admitting: Cardiovascular Disease

## 2016-05-19 VITALS — BP 131/73 | HR 71 | Ht 61.0 in | Wt 248.2 lb

## 2016-05-19 DIAGNOSIS — I10 Essential (primary) hypertension: Secondary | ICD-10-CM

## 2016-05-19 DIAGNOSIS — I25810 Atherosclerosis of coronary artery bypass graft(s) without angina pectoris: Secondary | ICD-10-CM | POA: Diagnosis not present

## 2016-05-19 DIAGNOSIS — R790 Abnormal level of blood mineral: Secondary | ICD-10-CM

## 2016-05-19 DIAGNOSIS — Z951 Presence of aortocoronary bypass graft: Secondary | ICD-10-CM

## 2016-05-19 DIAGNOSIS — I5022 Chronic systolic (congestive) heart failure: Secondary | ICD-10-CM | POA: Diagnosis not present

## 2016-05-19 DIAGNOSIS — I639 Cerebral infarction, unspecified: Secondary | ICD-10-CM

## 2016-05-19 DIAGNOSIS — E876 Hypokalemia: Secondary | ICD-10-CM

## 2016-05-19 DIAGNOSIS — I255 Ischemic cardiomyopathy: Secondary | ICD-10-CM

## 2016-05-19 DIAGNOSIS — E7800 Pure hypercholesterolemia, unspecified: Secondary | ICD-10-CM

## 2016-05-19 DIAGNOSIS — I2510 Atherosclerotic heart disease of native coronary artery without angina pectoris: Secondary | ICD-10-CM

## 2016-05-19 NOTE — Patient Instructions (Signed)

## 2016-05-19 NOTE — Progress Notes (Signed)
SUBJECTIVE: The patient is here for routine cardiovascular followup. In summary, she is a 65 year old female with a h/o an MI on October 01, 2012. She then underwent a 3 vessel CABG (10-08-2012) with a saphenous vein graft to the first obtuse marginal, a saphenous vein graft to the first diagonal via a y graft off of the graft to the first obtuse marginal, and a left internal mammary artery to the left anterior descending coronary artery.   She has mildly reduced LV systolic function (EF [id number]), grade II diastolic dysfunction, and mild to moderate mitral and tricuspid regurgitation as per echocardiogram performed on 10/23/13.   She also has type 2 diabetes mellitus as well as hypertension and dyslipidemia.   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 She was placed on Plavix by her neurologist.   She wore a monitor which showed no evidence of atrial fibrillation over 2 years ago.   She is taking torsemide for leg edema, along with potassium and magnesium supplements.   She is feeling well, and denies chest pain, shortness of breath, palpitations, orthopnea and paroxysmal nocturnal dyspnea.   Review of Systems: As per "subjective", otherwise negative.  Allergies  Allergen Reactions  . Bee Venom   . Codeine Nausea Only and Other (See Comments)    Other reaction(s): Other (See Comments) headache    Current Outpatient Prescriptions  Medication Sig Dispense Refill  . cholecalciferol (VITAMIN D) 1000 UNITS tablet Take 1,000 Units by mouth daily.    . clopidogrel (PLAVIX) 75 MG tablet Take 1 tablet (75 mg total) by mouth daily. 30 tablet 6  . colestipol (COLESTID) 1 g tablet TAKE 1 TABLET BY MOUTH 30 MINUTES BEFORE BREAKFAST AND SUPPER 60 tablet 6  . insulin NPH-regular (NOVOLIN 70/30) (70-30) 100 UNIT/ML injection Inject 65 Units into the skin 2 (two) times daily with a meal. 45 units in am / 20 units at night    . lisinopril  (PRINIVIL,ZESTRIL) 20 MG tablet Take 20 mg by mouth daily.    . Magnesium 400 MG CAPS Take 1 capsule by mouth daily. 30 capsule 6  . metFORMIN (GLUCOPHAGE) 1000 MG tablet Take 1,000 mg by mouth 2 (two) times daily with a meal.    . metoprolol tartrate (LOPRESSOR) 25 MG tablet Take 12.5 mg by mouth 2 (two) times daily.    . Multiple Vitamin (MULTIVITAMIN) tablet Take 1 tablet by mouth daily.    Marland Kitchen omeprazole (PRILOSEC) 20 MG capsule TAKE 1 CAPSULE BY MOUTH EVERY DAY 90 capsule 0  . potassium chloride SA (K-DUR,KLOR-CON) 20 MEQ tablet Take 1 tablet (20 mEq total) by mouth daily. 30 tablet 1  . ranitidine (ZANTAC) 150 MG tablet TAKE 2 TABLETS BY MOUTH AT BEDTIME 120 tablet 1  . simvastatin (ZOCOR) 40 MG tablet Take 40 mg by mouth every evening.    Marland Kitchen tiZANidine (ZANAFLEX) 4 MG tablet Take 4 mg by mouth daily.     Marland Kitchen torsemide (DEMADEX) 20 MG tablet Take 1 tablet (20 mg total) by mouth daily. 30 tablet 1  . travoprost, benzalkonium, (TRAVATAN) 0.004 % ophthalmic solution Place 1 drop into the right eye at bedtime.     No current facility-administered medications for this visit.     Past Medical History:  Diagnosis Date  . CAD (coronary artery disease)   . Carcinoid tumor   . Chest pain   . Colon cancer ([place]) 2008  . DM (diabetes mellitus) ([place])   .  HTN (hypertension)   . MI (myocardial infarction) FEB 2014   Acute MI of anterior wall  . Pulmonary edema   . Stroke [facility]) 2010 RIGHT SIDED WEAKNESS    Past Surgical History:  Procedure Laterality Date  . BACTERIAL OVERGROWTH TEST N/A 03/18/2015   Procedure: BACTERIAL OVERGROWTH TEST;  Surgeon: Danie Binder, MD;  Location: [facility];  Service: Endoscopy;  Laterality: N/A;  0800  . CARDIAC CATHETERIZATION  08/2008  . CHOLECYSTECTOMY    . COLONOSCOPY N/A 03/03/2015   SLF: 1. One colon polyp removed 2. Mild diverticulosis in the sigmoid colon 3. Small internal hemorrhoids  . CORONARY ARTERY BYPASS GRAFT  10/08/2012  . SHOULDER SURGERY    .  TUBAL LIGATION      Social History   Social History  . Marital status: Divorced    Spouse name: N/A  . Number of children: N/A  . Years of education: N/A   Occupational History  . Not on file.   Social History Main Topics  . Smoking status: Never Smoker  . Smokeless tobacco: Never Used  . Alcohol use No  . Drug use: No  . Sexual activity: Not on file   Other Topics Concern  . Not on file   Social History Narrative  . No narrative on file     Vitals:   05/19/16 1455  Height: 5\' 1"  (1.549 m)    PHYSICAL EXAM General: NAD HEENT: Normal. Neck: No JVD, no thyromegaly. Lungs: Clear to auscultation bilaterally with normal respiratory effort. CV: Nondisplaced PMI. Regular rate and rhythm, normal S1/S2, no S3/S4, no murmur. Trace periankle edema.   Abdomen: Soft, nontender,obese.  Neurologic: Alert and oriented x 3.  Psych: Normal affect. Skin: Normal. Musculoskeletal: Weakness of right arm and leg.    ECG: Most recent ECG reviewed.      ASSESSMENT AND PLAN: 1. CAD s/p 3-vessel CABG: Stable ischemic heart disease. Continue metoprolol, ACEI, Plavix, and Zocor.   2. Ischemic cardiomyopathy/chronic systolic heart failure: Euvolemic and stable. Continue torsemide 20 mg daily. For her prior hypokalemia and hypomagnesemia, will continue KCl 20 meq daily and magnesium oxide 400 mg daily respectively. Continue lisinopril and metoprolol.   3. Carotid artery stenosis: 123456 RICA, 123456 LICA stenosis [id number]. Repeat in 2 years. Continue Plavix.   4. Essential hypertension: Controlled on current therapy. No changes.  5. Hyperlipidemia: Continue simvastatin 40 mg.  6. Valvular heart disease: Presently stable. No evidence of pulmonary edema.  Dispo: f/u 1 year.   Kate Sable, M.D., F.A.C.C.

## 2016-07-04 DIAGNOSIS — M25532 Pain in left wrist: Secondary | ICD-10-CM | POA: Diagnosis not present

## 2016-07-12 ENCOUNTER — Other Ambulatory Visit: Payer: Self-pay | Admitting: Cardiovascular Disease

## 2016-07-20 DIAGNOSIS — E782 Mixed hyperlipidemia: Secondary | ICD-10-CM | POA: Diagnosis not present

## 2016-07-20 DIAGNOSIS — I10 Essential (primary) hypertension: Secondary | ICD-10-CM | POA: Diagnosis not present

## 2016-07-20 DIAGNOSIS — E1165 Type 2 diabetes mellitus with hyperglycemia: Secondary | ICD-10-CM | POA: Diagnosis not present

## 2016-07-24 DIAGNOSIS — I255 Ischemic cardiomyopathy: Secondary | ICD-10-CM | POA: Diagnosis not present

## 2016-07-24 DIAGNOSIS — E782 Mixed hyperlipidemia: Secondary | ICD-10-CM | POA: Diagnosis not present

## 2016-07-24 DIAGNOSIS — I5022 Chronic systolic (congestive) heart failure: Secondary | ICD-10-CM | POA: Diagnosis not present

## 2016-07-24 DIAGNOSIS — N183 Chronic kidney disease, stage 3 (moderate): Secondary | ICD-10-CM | POA: Diagnosis not present

## 2016-07-24 DIAGNOSIS — E1165 Type 2 diabetes mellitus with hyperglycemia: Secondary | ICD-10-CM | POA: Diagnosis not present

## 2016-07-24 DIAGNOSIS — C7A029 Malignant carcinoid tumor of the large intestine, unspecified portion: Secondary | ICD-10-CM | POA: Diagnosis not present

## 2016-07-24 DIAGNOSIS — I10 Essential (primary) hypertension: Secondary | ICD-10-CM | POA: Diagnosis not present

## 2016-07-24 DIAGNOSIS — Z6841 Body Mass Index (BMI) 40.0 and over, adult: Secondary | ICD-10-CM | POA: Diagnosis not present

## 2016-07-26 DIAGNOSIS — M931 Kienbock's disease of adults: Secondary | ICD-10-CM | POA: Diagnosis not present

## 2016-08-02 DIAGNOSIS — M25532 Pain in left wrist: Secondary | ICD-10-CM | POA: Diagnosis not present

## 2016-10-31 DIAGNOSIS — N183 Chronic kidney disease, stage 3 (moderate): Secondary | ICD-10-CM | POA: Diagnosis not present

## 2016-10-31 DIAGNOSIS — I10 Essential (primary) hypertension: Secondary | ICD-10-CM | POA: Diagnosis not present

## 2016-10-31 DIAGNOSIS — E782 Mixed hyperlipidemia: Secondary | ICD-10-CM | POA: Diagnosis not present

## 2016-10-31 DIAGNOSIS — I5022 Chronic systolic (congestive) heart failure: Secondary | ICD-10-CM | POA: Diagnosis not present

## 2016-10-31 DIAGNOSIS — E039 Hypothyroidism, unspecified: Secondary | ICD-10-CM | POA: Diagnosis not present

## 2016-10-31 DIAGNOSIS — E1165 Type 2 diabetes mellitus with hyperglycemia: Secondary | ICD-10-CM | POA: Diagnosis not present

## 2016-10-31 DIAGNOSIS — C7A029 Malignant carcinoid tumor of the large intestine, unspecified portion: Secondary | ICD-10-CM | POA: Diagnosis not present

## 2016-11-01 ENCOUNTER — Other Ambulatory Visit: Payer: Self-pay | Admitting: Cardiovascular Disease

## 2016-11-06 DIAGNOSIS — I10 Essential (primary) hypertension: Secondary | ICD-10-CM | POA: Diagnosis not present

## 2016-11-06 DIAGNOSIS — I255 Ischemic cardiomyopathy: Secondary | ICD-10-CM | POA: Diagnosis not present

## 2016-11-06 DIAGNOSIS — E782 Mixed hyperlipidemia: Secondary | ICD-10-CM | POA: Diagnosis not present

## 2016-11-06 DIAGNOSIS — E039 Hypothyroidism, unspecified: Secondary | ICD-10-CM | POA: Diagnosis not present

## 2016-11-06 DIAGNOSIS — C7A029 Malignant carcinoid tumor of the large intestine, unspecified portion: Secondary | ICD-10-CM | POA: Diagnosis not present

## 2016-11-06 DIAGNOSIS — E1165 Type 2 diabetes mellitus with hyperglycemia: Secondary | ICD-10-CM | POA: Diagnosis not present

## 2016-11-06 DIAGNOSIS — I5022 Chronic systolic (congestive) heart failure: Secondary | ICD-10-CM | POA: Diagnosis not present

## 2016-11-06 DIAGNOSIS — I240 Acute coronary thrombosis not resulting in myocardial infarction: Secondary | ICD-10-CM | POA: Diagnosis not present

## 2017-01-28 ENCOUNTER — Other Ambulatory Visit: Payer: Self-pay | Admitting: Cardiovascular Disease

## 2017-01-31 DIAGNOSIS — I5022 Chronic systolic (congestive) heart failure: Secondary | ICD-10-CM | POA: Diagnosis not present

## 2017-01-31 DIAGNOSIS — E782 Mixed hyperlipidemia: Secondary | ICD-10-CM | POA: Diagnosis not present

## 2017-01-31 DIAGNOSIS — I10 Essential (primary) hypertension: Secondary | ICD-10-CM | POA: Diagnosis not present

## 2017-01-31 DIAGNOSIS — E039 Hypothyroidism, unspecified: Secondary | ICD-10-CM | POA: Diagnosis not present

## 2017-01-31 DIAGNOSIS — E1165 Type 2 diabetes mellitus with hyperglycemia: Secondary | ICD-10-CM | POA: Diagnosis not present

## 2017-02-06 DIAGNOSIS — I240 Acute coronary thrombosis not resulting in myocardial infarction: Secondary | ICD-10-CM | POA: Diagnosis not present

## 2017-02-06 DIAGNOSIS — E039 Hypothyroidism, unspecified: Secondary | ICD-10-CM | POA: Diagnosis not present

## 2017-02-06 DIAGNOSIS — E782 Mixed hyperlipidemia: Secondary | ICD-10-CM | POA: Diagnosis not present

## 2017-02-06 DIAGNOSIS — I255 Ischemic cardiomyopathy: Secondary | ICD-10-CM | POA: Diagnosis not present

## 2017-02-06 DIAGNOSIS — E1165 Type 2 diabetes mellitus with hyperglycemia: Secondary | ICD-10-CM | POA: Diagnosis not present

## 2017-02-06 DIAGNOSIS — Z6841 Body Mass Index (BMI) 40.0 and over, adult: Secondary | ICD-10-CM | POA: Diagnosis not present

## 2017-02-06 DIAGNOSIS — C7A029 Malignant carcinoid tumor of the large intestine, unspecified portion: Secondary | ICD-10-CM | POA: Diagnosis not present

## 2017-02-06 DIAGNOSIS — I10 Essential (primary) hypertension: Secondary | ICD-10-CM | POA: Diagnosis not present

## 2017-02-14 DIAGNOSIS — I110 Hypertensive heart disease with heart failure: Secondary | ICD-10-CM | POA: Diagnosis not present

## 2017-02-14 DIAGNOSIS — R197 Diarrhea, unspecified: Secondary | ICD-10-CM | POA: Diagnosis not present

## 2017-02-14 DIAGNOSIS — G4762 Sleep related leg cramps: Secondary | ICD-10-CM | POA: Diagnosis not present

## 2017-02-14 DIAGNOSIS — E7800 Pure hypercholesterolemia, unspecified: Secondary | ICD-10-CM | POA: Diagnosis not present

## 2017-02-14 DIAGNOSIS — E1122 Type 2 diabetes mellitus with diabetic chronic kidney disease: Secondary | ICD-10-CM | POA: Diagnosis not present

## 2017-02-14 DIAGNOSIS — Z79899 Other long term (current) drug therapy: Secondary | ICD-10-CM | POA: Diagnosis not present

## 2017-02-14 DIAGNOSIS — Z0000 Encounter for general adult medical examination without abnormal findings: Secondary | ICD-10-CM | POA: Diagnosis not present

## 2017-02-14 DIAGNOSIS — K219 Gastro-esophageal reflux disease without esophagitis: Secondary | ICD-10-CM | POA: Diagnosis not present

## 2017-02-14 DIAGNOSIS — G8191 Hemiplegia, unspecified affecting right dominant side: Secondary | ICD-10-CM | POA: Diagnosis not present

## 2017-02-14 DIAGNOSIS — N183 Chronic kidney disease, stage 3 (moderate): Secondary | ICD-10-CM | POA: Diagnosis not present

## 2017-02-14 DIAGNOSIS — M21619 Bunion of unspecified foot: Secondary | ICD-10-CM | POA: Diagnosis not present

## 2017-02-14 DIAGNOSIS — H40009 Preglaucoma, unspecified, unspecified eye: Secondary | ICD-10-CM | POA: Diagnosis not present

## 2017-02-14 DIAGNOSIS — Z6841 Body Mass Index (BMI) 40.0 and over, adult: Secondary | ICD-10-CM | POA: Diagnosis not present

## 2017-02-14 DIAGNOSIS — Z7902 Long term (current) use of antithrombotics/antiplatelets: Secondary | ICD-10-CM | POA: Diagnosis not present

## 2017-02-14 DIAGNOSIS — I5022 Chronic systolic (congestive) heart failure: Secondary | ICD-10-CM | POA: Diagnosis not present

## 2017-02-14 DIAGNOSIS — Z86018 Personal history of other benign neoplasm: Secondary | ICD-10-CM | POA: Diagnosis not present

## 2017-02-14 DIAGNOSIS — Z794 Long term (current) use of insulin: Secondary | ICD-10-CM | POA: Diagnosis not present

## 2017-02-14 DIAGNOSIS — Z79810 Long term (current) use of selective estrogen receptor modulators (SERMs): Secondary | ICD-10-CM | POA: Diagnosis not present

## 2017-02-19 DIAGNOSIS — Z961 Presence of intraocular lens: Secondary | ICD-10-CM | POA: Diagnosis not present

## 2017-02-19 DIAGNOSIS — E119 Type 2 diabetes mellitus without complications: Secondary | ICD-10-CM | POA: Diagnosis not present

## 2017-02-19 DIAGNOSIS — H524 Presbyopia: Secondary | ICD-10-CM | POA: Diagnosis not present

## 2017-02-19 DIAGNOSIS — H52223 Regular astigmatism, bilateral: Secondary | ICD-10-CM | POA: Diagnosis not present

## 2017-02-19 DIAGNOSIS — H40033 Anatomical narrow angle, bilateral: Secondary | ICD-10-CM | POA: Diagnosis not present

## 2017-02-19 DIAGNOSIS — H5203 Hypermetropia, bilateral: Secondary | ICD-10-CM | POA: Diagnosis not present

## 2017-02-19 DIAGNOSIS — H401132 Primary open-angle glaucoma, bilateral, moderate stage: Secondary | ICD-10-CM | POA: Diagnosis not present

## 2017-02-27 ENCOUNTER — Other Ambulatory Visit: Payer: Self-pay | Admitting: Cardiovascular Disease

## 2017-05-07 DIAGNOSIS — C7A029 Malignant carcinoid tumor of the large intestine, unspecified portion: Secondary | ICD-10-CM | POA: Diagnosis not present

## 2017-05-08 DIAGNOSIS — E1165 Type 2 diabetes mellitus with hyperglycemia: Secondary | ICD-10-CM | POA: Diagnosis not present

## 2017-05-08 DIAGNOSIS — I10 Essential (primary) hypertension: Secondary | ICD-10-CM | POA: Diagnosis not present

## 2017-05-08 DIAGNOSIS — N183 Chronic kidney disease, stage 3 (moderate): Secondary | ICD-10-CM | POA: Diagnosis not present

## 2017-05-10 ENCOUNTER — Telehealth: Payer: Self-pay | Admitting: Cardiovascular Disease

## 2017-05-10 DIAGNOSIS — N182 Chronic kidney disease, stage 2 (mild): Secondary | ICD-10-CM | POA: Diagnosis not present

## 2017-05-10 DIAGNOSIS — E1165 Type 2 diabetes mellitus with hyperglycemia: Secondary | ICD-10-CM | POA: Diagnosis not present

## 2017-05-10 DIAGNOSIS — Z23 Encounter for immunization: Secondary | ICD-10-CM | POA: Diagnosis not present

## 2017-05-10 DIAGNOSIS — I10 Essential (primary) hypertension: Secondary | ICD-10-CM | POA: Diagnosis not present

## 2017-05-10 DIAGNOSIS — E782 Mixed hyperlipidemia: Secondary | ICD-10-CM | POA: Diagnosis not present

## 2017-05-10 DIAGNOSIS — I255 Ischemic cardiomyopathy: Secondary | ICD-10-CM | POA: Diagnosis not present

## 2017-05-10 DIAGNOSIS — I240 Acute coronary thrombosis not resulting in myocardial infarction: Secondary | ICD-10-CM | POA: Diagnosis not present

## 2017-05-10 DIAGNOSIS — Z6841 Body Mass Index (BMI) 40.0 and over, adult: Secondary | ICD-10-CM | POA: Diagnosis not present

## 2017-05-10 NOTE — Telephone Encounter (Signed)
CAROTID Scheduled  Oct 17,2018 in [facility]

## 2017-05-14 ENCOUNTER — Other Ambulatory Visit: Payer: Self-pay | Admitting: Cardiovascular Disease

## 2017-05-14 DIAGNOSIS — I6523 Occlusion and stenosis of bilateral carotid arteries: Secondary | ICD-10-CM

## 2017-05-23 ENCOUNTER — Other Ambulatory Visit: Payer: Self-pay | Admitting: Cardiovascular Disease

## 2017-05-23 ENCOUNTER — Ambulatory Visit (INDEPENDENT_AMBULATORY_CARE_PROVIDER_SITE_OTHER): Payer: Medicare HMO

## 2017-05-23 DIAGNOSIS — I6523 Occlusion and stenosis of bilateral carotid arteries: Secondary | ICD-10-CM | POA: Diagnosis not present

## 2017-05-24 LAB — VAS US CAROTID
LCCAPSYS: 119 cm/s
LEFT ECA DIAS: 0 cm/s
LEFT VERTEBRAL DIAS: -11 cm/s
LICAPSYS: -152 cm/s
Left CCA dist dias: 0 cm/s
Left CCA dist sys: -89 cm/s
Left CCA prox dias: 18 cm/s
Left ICA dist dias: -18 cm/s
Left ICA dist sys: -106 cm/s
Left ICA prox dias: -29 cm/s
RCCADSYS: -126 cm/s
RCCAPDIAS: 12 cm/s
RIGHT ECA DIAS: 0 cm/s
RIGHT VERTEBRAL DIAS: -12 cm/s
Right CCA prox sys: 82 cm/s

## 2017-05-25 ENCOUNTER — Telehealth: Payer: Self-pay | Admitting: *Deleted

## 2017-05-25 NOTE — Telephone Encounter (Signed)
Notes recorded by Laurine Blazer, LPN on [id number] at 5:33 PM EDT Patient notified. Copy to pmd. Follow up scheduled 06/05/2017. ------  Notes recorded by Herminio Commons, MD on 05/24/2017 at 11:53 AM EDT Very mild blockages. No need to repeat.

## 2017-06-05 ENCOUNTER — Ambulatory Visit (INDEPENDENT_AMBULATORY_CARE_PROVIDER_SITE_OTHER): Payer: Medicare HMO | Admitting: Cardiovascular Disease

## 2017-06-05 ENCOUNTER — Encounter: Payer: Self-pay | Admitting: *Deleted

## 2017-06-05 ENCOUNTER — Encounter: Payer: Self-pay | Admitting: Cardiovascular Disease

## 2017-06-05 VITALS — BP 118/58 | HR 75 | Ht 60.0 in | Wt 248.0 lb

## 2017-06-05 DIAGNOSIS — I639 Cerebral infarction, unspecified: Secondary | ICD-10-CM

## 2017-06-05 DIAGNOSIS — I5022 Chronic systolic (congestive) heart failure: Secondary | ICD-10-CM | POA: Diagnosis not present

## 2017-06-05 DIAGNOSIS — E876 Hypokalemia: Secondary | ICD-10-CM

## 2017-06-05 DIAGNOSIS — R790 Abnormal level of blood mineral: Secondary | ICD-10-CM

## 2017-06-05 DIAGNOSIS — E7800 Pure hypercholesterolemia, unspecified: Secondary | ICD-10-CM | POA: Diagnosis not present

## 2017-06-05 DIAGNOSIS — I10 Essential (primary) hypertension: Secondary | ICD-10-CM

## 2017-06-05 DIAGNOSIS — I25708 Atherosclerosis of coronary artery bypass graft(s), unspecified, with other forms of angina pectoris: Secondary | ICD-10-CM | POA: Diagnosis not present

## 2017-06-05 DIAGNOSIS — I255 Ischemic cardiomyopathy: Secondary | ICD-10-CM

## 2017-06-05 MED ORDER — NITROGLYCERIN 0.4 MG SL SUBL: 0.4000 mg | SUBLINGUAL_TABLET | SUBLINGUAL | 3 refills | Status: AC | PRN

## 2017-06-05 NOTE — Addendum Note (Signed)
Addended by: Laurine Blazer on: 06/05/2017 03:40 PM   Modules accepted: Orders

## 2017-06-05 NOTE — Progress Notes (Signed)
SUBJECTIVE: The patient is here for routine cardiovascular followup. She has a history of an MI on October 01, 2012. She then underwent a 3 vessel CABG (10-08-2012) with a saphenous vein graft to the first obtuse marginal, a saphenous vein graft to the first diagonal via a y graft off of the graft to the first obtuse marginal, and a left internal mammary artery to the left anterior descending coronary artery.   She has mildly reduced LV systolic function (EF 73-42%), grade II diastolic dysfunction, and mild to moderate mitral and tricuspid regurgitation as per echocardiogram performed on 10/23/13.   She also has type 2 diabetes mellitus as well as hypertension and dyslipidemia.   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 She was placed on Plavix by her neurologist.   She wore a monitor which showed no evidence of atrial fibrillation a few years ago.    She denies chest pain, palpitations, shortness of breath.  Ever since her stroke her right leg is cold primarily at night when going to sleep.  She also has chronic right ankle swelling.      Review of Systems: As per "subjective", otherwise negative.  Allergies  Allergen Reactions  . Bee Venom   . Codeine Nausea Only and Other (See Comments)    Other reaction(s): Other (See Comments) headache    Current Outpatient Prescriptions  Medication Sig Dispense Refill  . cholecalciferol (VITAMIN D) 1000 UNITS tablet Take 1,000 Units by mouth daily.    . clopidogrel (PLAVIX) 75 MG tablet Take 1 tablet (75 mg total) by mouth daily. 30 tablet 6  . colestipol (COLESTID) 1 g tablet TAKE 1 TABLET BY MOUTH 30 MINUTES BEFORE BREAKFAST AND SUPPER 60 tablet 6  . insulin NPH-regular (NOVOLIN 70/30) (70-30) 100 UNIT/ML injection Inject 65 Units into the skin 2 (two) times daily with a meal. 45 units in am / 20 units at night    . lisinopril (PRINIVIL,ZESTRIL) 20 MG tablet Take 20 mg by  mouth daily.    . Magnesium 400 MG CAPS Take 1 capsule by mouth daily. 30 capsule 6  . metFORMIN (GLUCOPHAGE) 1000 MG tablet Take 1,000 mg by mouth 2 (two) times daily with a meal.    . metoprolol tartrate (LOPRESSOR) 25 MG tablet Take 12.5 mg by mouth 2 (two) times daily.    . Multiple Vitamin (MULTIVITAMIN) tablet Take 1 tablet by mouth daily.    Marland Kitchen omeprazole (PRILOSEC) 20 MG capsule TAKE 1 CAPSULE BY MOUTH EVERY DAY 90 capsule 0  . potassium chloride SA (K-DUR,KLOR-CON) 20 MEQ tablet TAKE 1 TABLET BY MOUTH DAILY 30 tablet 6  . simvastatin (ZOCOR) 40 MG tablet Take 40 mg by mouth every evening.    Marland Kitchen tiZANidine (ZANAFLEX) 4 MG tablet Take 4 mg by mouth daily.     Marland Kitchen torsemide (DEMADEX) 20 MG tablet TAKE 1 TABLET BY MOUTH DAILY 30 tablet 6  . travoprost, benzalkonium, (TRAVATAN) 0.004 % ophthalmic solution Place 1 drop into the right eye at bedtime.     No current facility-administered medications for this visit.     Past Medical History:  Diagnosis Date  . CAD (coronary artery disease)   . Carcinoid tumor   . Chest pain   . Colon cancer ([place]) 2008  . DM (diabetes mellitus) ([place])   . HTN (hypertension)   . MI (myocardial infarction) ([place]) FEB 2014   Acute MI of anterior wall  . Pulmonary edema   .  Stroke [facility]) 2010 RIGHT SIDED WEAKNESS    Past Surgical History:  Procedure Laterality Date  . BACTERIAL OVERGROWTH TEST N/A 03/18/2015   Procedure: BACTERIAL OVERGROWTH TEST;  Surgeon: Danie Binder, MD;  Location: [facility];  Service: Endoscopy;  Laterality: N/A;  0800  . CARDIAC CATHETERIZATION  08/2008  . CHOLECYSTECTOMY    . COLONOSCOPY N/A 03/03/2015   SLF: 1. One colon polyp removed 2. Mild diverticulosis in the sigmoid colon 3. Small internal hemorrhoids  . CORONARY ARTERY BYPASS GRAFT  10/08/2012  . SHOULDER SURGERY    . TUBAL LIGATION      Social History   Social History  . Marital status: Divorced    Spouse name: N/A  . Number of children: N/A  . Years of education:  N/A   Occupational History  . Not on file.   Social History Main Topics  . Smoking status: Never Smoker  . Smokeless tobacco: Never Used  . Alcohol use No  . Drug use: No  . Sexual activity: Not on file   Other Topics Concern  . Not on file   Social History Narrative  . No narrative on file     Vitals:   06/05/17 1500  BP: (!) 118/58  Pulse: 75  SpO2: 98%  Weight: 248 lb (112.5 kg)  Height: 5' (1.524 m)    Wt Readings from Last 3 Encounters:  06/05/17 248 lb (112.5 kg)  05/19/16 248 lb 3.2 oz (112.6 kg)  05/18/15 246 lb (111.6 kg)     PHYSICAL EXAM General: NAD HEENT: Normal. Neck: No JVD, no thyromegaly. Lungs: Clear to auscultation bilaterally with normal respiratory effort. CV: Regular rate and rhythm, normal S1/S2, no S3/S4, no murmur. Mild right periankle edema.  No carotid bruit.   Abdomen: Soft, nontender, no distention, protuberant.  Neurologic: Alert and oriented.  Weakness of right arm and leg. Psych: Normal affect. Skin: Normal. Musculoskeletal: No gross deformities.    ECG: Most recent ECG reviewed.   Labs: Lab Results  Component Value Date/Time   K 4.3 11/17/2008 06:12 AM   BUN 15 11/17/2008 06:12 AM   CREATININE 0.96 11/17/2008 06:12 AM   ALT 23 10/29/2008 06:25 AM   HGB 12.2 10/29/2008 06:25 AM     Lipids: No results found for: LDLCALC, LDLDIRECT, CHOL, TRIG, HDL     ASSESSMENT AND PLAN:  1. CAD s/p 3-vessel CABG: Stable ischemic heart disease.  Continue metoprolol, lisinopril, clopidogrel, and simvastatin.  2. Ischemic cardiomyopathy/chronic systolic heart failure: Euvolemic and stable. Continue torsemide 20 mg daily. For her prior hypokalemia and hypomagnesemia, will continue KCl 20 meq daily and magnesium oxide 400 mg daily respectively. Continue lisinopril and metoprolol.   3. Carotid artery stenosis: 16-07% RICA, 3-71% LICA stenosis 01/07/68. Repeat in 1 year. Continue Plavix.   4. Essential hypertension: Controlled on  current therapy. No changes.  5. Hyperlipidemia: Continue simvastatin 40 mg.  I will obtain a copy of lipids from PCP for personal review.  6. Valvular heart disease: Presently stable. No evidence of pulmonary edema.      Disposition: Follow up 1 year.   Kate Sable, M.D., F.A.C.C.

## 2017-06-05 NOTE — Patient Instructions (Signed)
Medication Instructions:   Begin Nitroglycerin as needed for severe chest pain only.   Continue all other medications.    Labwork: none  Testing/Procedures: none  Follow-Up: Your physician wants you to follow up in:  1 year.  You will receive a reminder letter in the mail one-two months in advance.  If you don't receive a letter, please call our office to schedule the follow up appointment   Any Other Special Instructions Will Be Listed Below (If Applicable).  If you need a refill on your cardiac medications before your next appointment, please call your pharmacy.  

## 2017-08-09 DIAGNOSIS — I5022 Chronic systolic (congestive) heart failure: Secondary | ICD-10-CM | POA: Diagnosis not present

## 2017-08-09 DIAGNOSIS — I10 Essential (primary) hypertension: Secondary | ICD-10-CM | POA: Diagnosis not present

## 2017-08-09 DIAGNOSIS — E1122 Type 2 diabetes mellitus with diabetic chronic kidney disease: Secondary | ICD-10-CM | POA: Diagnosis not present

## 2017-08-09 DIAGNOSIS — E782 Mixed hyperlipidemia: Secondary | ICD-10-CM | POA: Diagnosis not present

## 2017-08-09 DIAGNOSIS — N183 Chronic kidney disease, stage 3 (moderate): Secondary | ICD-10-CM | POA: Diagnosis not present

## 2017-08-09 DIAGNOSIS — E870 Hyperosmolality and hypernatremia: Secondary | ICD-10-CM | POA: Diagnosis not present

## 2017-08-09 DIAGNOSIS — E1165 Type 2 diabetes mellitus with hyperglycemia: Secondary | ICD-10-CM | POA: Diagnosis not present

## 2017-08-09 DIAGNOSIS — E039 Hypothyroidism, unspecified: Secondary | ICD-10-CM | POA: Diagnosis not present

## 2017-08-09 DIAGNOSIS — C7A029 Malignant carcinoid tumor of the large intestine, unspecified portion: Secondary | ICD-10-CM | POA: Diagnosis not present

## 2017-08-15 DIAGNOSIS — M5432 Sciatica, left side: Secondary | ICD-10-CM | POA: Diagnosis not present

## 2017-08-15 DIAGNOSIS — M5416 Radiculopathy, lumbar region: Secondary | ICD-10-CM | POA: Diagnosis not present

## 2017-08-15 DIAGNOSIS — I5022 Chronic systolic (congestive) heart failure: Secondary | ICD-10-CM | POA: Diagnosis not present

## 2017-08-15 DIAGNOSIS — E870 Hyperosmolality and hypernatremia: Secondary | ICD-10-CM | POA: Diagnosis not present

## 2017-08-15 DIAGNOSIS — Z6841 Body Mass Index (BMI) 40.0 and over, adult: Secondary | ICD-10-CM | POA: Diagnosis not present

## 2017-08-15 DIAGNOSIS — E1122 Type 2 diabetes mellitus with diabetic chronic kidney disease: Secondary | ICD-10-CM | POA: Diagnosis not present

## 2017-08-15 DIAGNOSIS — I10 Essential (primary) hypertension: Secondary | ICD-10-CM | POA: Diagnosis not present

## 2017-08-15 DIAGNOSIS — I255 Ischemic cardiomyopathy: Secondary | ICD-10-CM | POA: Diagnosis not present

## 2017-08-21 DIAGNOSIS — E119 Type 2 diabetes mellitus without complications: Secondary | ICD-10-CM | POA: Diagnosis not present

## 2017-08-21 DIAGNOSIS — H5203 Hypermetropia, bilateral: Secondary | ICD-10-CM | POA: Diagnosis not present

## 2017-08-21 DIAGNOSIS — H25812 Combined forms of age-related cataract, left eye: Secondary | ICD-10-CM | POA: Diagnosis not present

## 2017-08-21 DIAGNOSIS — H401132 Primary open-angle glaucoma, bilateral, moderate stage: Secondary | ICD-10-CM | POA: Diagnosis not present

## 2017-08-21 DIAGNOSIS — H1132 Conjunctival hemorrhage, left eye: Secondary | ICD-10-CM | POA: Diagnosis not present

## 2017-08-21 DIAGNOSIS — H52223 Regular astigmatism, bilateral: Secondary | ICD-10-CM | POA: Diagnosis not present

## 2017-08-21 DIAGNOSIS — Z961 Presence of intraocular lens: Secondary | ICD-10-CM | POA: Diagnosis not present

## 2017-08-21 DIAGNOSIS — H524 Presbyopia: Secondary | ICD-10-CM | POA: Diagnosis not present

## 2017-09-20 ENCOUNTER — Other Ambulatory Visit: Payer: Self-pay | Admitting: Cardiovascular Disease

## 2017-09-26 DIAGNOSIS — E1122 Type 2 diabetes mellitus with diabetic chronic kidney disease: Secondary | ICD-10-CM | POA: Diagnosis not present

## 2017-09-26 DIAGNOSIS — D649 Anemia, unspecified: Secondary | ICD-10-CM | POA: Diagnosis not present

## 2017-09-26 DIAGNOSIS — E1165 Type 2 diabetes mellitus with hyperglycemia: Secondary | ICD-10-CM | POA: Diagnosis not present

## 2017-09-26 DIAGNOSIS — D529 Folate deficiency anemia, unspecified: Secondary | ICD-10-CM | POA: Diagnosis not present

## 2017-09-26 DIAGNOSIS — E782 Mixed hyperlipidemia: Secondary | ICD-10-CM | POA: Diagnosis not present

## 2017-09-26 DIAGNOSIS — D519 Vitamin B12 deficiency anemia, unspecified: Secondary | ICD-10-CM | POA: Diagnosis not present

## 2017-09-26 DIAGNOSIS — I10 Essential (primary) hypertension: Secondary | ICD-10-CM | POA: Diagnosis not present

## 2017-10-01 DIAGNOSIS — D638 Anemia in other chronic diseases classified elsewhere: Secondary | ICD-10-CM | POA: Diagnosis not present

## 2017-10-01 DIAGNOSIS — I10 Essential (primary) hypertension: Secondary | ICD-10-CM | POA: Diagnosis not present

## 2017-10-01 DIAGNOSIS — I5022 Chronic systolic (congestive) heart failure: Secondary | ICD-10-CM | POA: Diagnosis not present

## 2017-10-01 DIAGNOSIS — I255 Ischemic cardiomyopathy: Secondary | ICD-10-CM | POA: Diagnosis not present

## 2017-10-01 DIAGNOSIS — E782 Mixed hyperlipidemia: Secondary | ICD-10-CM | POA: Diagnosis not present

## 2017-10-01 DIAGNOSIS — E1122 Type 2 diabetes mellitus with diabetic chronic kidney disease: Secondary | ICD-10-CM | POA: Diagnosis not present

## 2017-10-01 DIAGNOSIS — Z6841 Body Mass Index (BMI) 40.0 and over, adult: Secondary | ICD-10-CM | POA: Diagnosis not present

## 2017-10-01 DIAGNOSIS — N183 Chronic kidney disease, stage 3 (moderate): Secondary | ICD-10-CM | POA: Diagnosis not present

## 2017-10-05 ENCOUNTER — Ambulatory Visit: Payer: Medicare HMO | Admitting: Urology

## 2017-10-05 DIAGNOSIS — R351 Nocturia: Secondary | ICD-10-CM | POA: Diagnosis not present

## 2017-10-05 DIAGNOSIS — R3914 Feeling of incomplete bladder emptying: Secondary | ICD-10-CM

## 2017-10-05 DIAGNOSIS — N952 Postmenopausal atrophic vaginitis: Secondary | ICD-10-CM | POA: Diagnosis not present

## 2017-10-05 DIAGNOSIS — N3946 Mixed incontinence: Secondary | ICD-10-CM

## 2017-10-30 DIAGNOSIS — N3946 Mixed incontinence: Secondary | ICD-10-CM | POA: Diagnosis not present

## 2017-11-06 DIAGNOSIS — N3946 Mixed incontinence: Secondary | ICD-10-CM | POA: Diagnosis not present

## 2017-11-13 DIAGNOSIS — N3946 Mixed incontinence: Secondary | ICD-10-CM | POA: Diagnosis not present

## 2017-11-20 DIAGNOSIS — N3946 Mixed incontinence: Secondary | ICD-10-CM | POA: Diagnosis not present

## 2017-11-27 DIAGNOSIS — N183 Chronic kidney disease, stage 3 (moderate): Secondary | ICD-10-CM | POA: Diagnosis not present

## 2017-11-27 DIAGNOSIS — D638 Anemia in other chronic diseases classified elsewhere: Secondary | ICD-10-CM | POA: Diagnosis not present

## 2017-11-27 DIAGNOSIS — E870 Hyperosmolality and hypernatremia: Secondary | ICD-10-CM | POA: Diagnosis not present

## 2017-11-27 DIAGNOSIS — I5022 Chronic systolic (congestive) heart failure: Secondary | ICD-10-CM | POA: Diagnosis not present

## 2017-11-27 DIAGNOSIS — E039 Hypothyroidism, unspecified: Secondary | ICD-10-CM | POA: Diagnosis not present

## 2017-11-27 DIAGNOSIS — E1165 Type 2 diabetes mellitus with hyperglycemia: Secondary | ICD-10-CM | POA: Diagnosis not present

## 2017-11-27 DIAGNOSIS — E782 Mixed hyperlipidemia: Secondary | ICD-10-CM | POA: Diagnosis not present

## 2017-11-27 DIAGNOSIS — I10 Essential (primary) hypertension: Secondary | ICD-10-CM | POA: Diagnosis not present

## 2017-11-27 DIAGNOSIS — N3946 Mixed incontinence: Secondary | ICD-10-CM | POA: Diagnosis not present

## 2017-11-27 DIAGNOSIS — E1122 Type 2 diabetes mellitus with diabetic chronic kidney disease: Secondary | ICD-10-CM | POA: Diagnosis not present

## 2017-11-29 DIAGNOSIS — I10 Essential (primary) hypertension: Secondary | ICD-10-CM | POA: Diagnosis not present

## 2017-11-29 DIAGNOSIS — E782 Mixed hyperlipidemia: Secondary | ICD-10-CM | POA: Diagnosis not present

## 2017-11-29 DIAGNOSIS — E1122 Type 2 diabetes mellitus with diabetic chronic kidney disease: Secondary | ICD-10-CM | POA: Diagnosis not present

## 2017-11-29 DIAGNOSIS — Z0001 Encounter for general adult medical examination with abnormal findings: Secondary | ICD-10-CM | POA: Diagnosis not present

## 2017-11-29 DIAGNOSIS — Z23 Encounter for immunization: Secondary | ICD-10-CM | POA: Diagnosis not present

## 2017-11-29 DIAGNOSIS — Z6841 Body Mass Index (BMI) 40.0 and over, adult: Secondary | ICD-10-CM | POA: Diagnosis not present

## 2017-11-29 DIAGNOSIS — Z1331 Encounter for screening for depression: Secondary | ICD-10-CM | POA: Diagnosis not present

## 2017-11-29 DIAGNOSIS — Z1389 Encounter for screening for other disorder: Secondary | ICD-10-CM | POA: Diagnosis not present

## 2017-12-04 DIAGNOSIS — N3946 Mixed incontinence: Secondary | ICD-10-CM | POA: Diagnosis not present

## 2017-12-11 DIAGNOSIS — N3946 Mixed incontinence: Secondary | ICD-10-CM | POA: Diagnosis not present

## 2017-12-12 DIAGNOSIS — R922 Inconclusive mammogram: Secondary | ICD-10-CM | POA: Diagnosis not present

## 2017-12-12 DIAGNOSIS — R928 Other abnormal and inconclusive findings on diagnostic imaging of breast: Secondary | ICD-10-CM | POA: Diagnosis not present

## 2017-12-18 ENCOUNTER — Other Ambulatory Visit: Payer: Self-pay | Admitting: Cardiovascular Disease

## 2017-12-18 DIAGNOSIS — N3946 Mixed incontinence: Secondary | ICD-10-CM | POA: Diagnosis not present

## 2017-12-25 DIAGNOSIS — N3946 Mixed incontinence: Secondary | ICD-10-CM | POA: Diagnosis not present

## 2018-01-01 DIAGNOSIS — N3946 Mixed incontinence: Secondary | ICD-10-CM | POA: Diagnosis not present

## 2018-01-08 DIAGNOSIS — N3946 Mixed incontinence: Secondary | ICD-10-CM | POA: Diagnosis not present

## 2018-01-15 DIAGNOSIS — N3946 Mixed incontinence: Secondary | ICD-10-CM | POA: Diagnosis not present

## 2018-02-05 DIAGNOSIS — N3946 Mixed incontinence: Secondary | ICD-10-CM | POA: Diagnosis not present

## 2018-02-06 DIAGNOSIS — I252 Old myocardial infarction: Secondary | ICD-10-CM | POA: Diagnosis not present

## 2018-02-06 DIAGNOSIS — E119 Type 2 diabetes mellitus without complications: Secondary | ICD-10-CM | POA: Diagnosis not present

## 2018-02-06 DIAGNOSIS — K219 Gastro-esophageal reflux disease without esophagitis: Secondary | ICD-10-CM | POA: Diagnosis not present

## 2018-02-06 DIAGNOSIS — R197 Diarrhea, unspecified: Secondary | ICD-10-CM | POA: Diagnosis not present

## 2018-02-06 DIAGNOSIS — I10 Essential (primary) hypertension: Secondary | ICD-10-CM | POA: Diagnosis not present

## 2018-02-06 DIAGNOSIS — I2510 Atherosclerotic heart disease of native coronary artery without angina pectoris: Secondary | ICD-10-CM | POA: Diagnosis not present

## 2018-02-06 DIAGNOSIS — Z794 Long term (current) use of insulin: Secondary | ICD-10-CM | POA: Diagnosis not present

## 2018-02-06 DIAGNOSIS — M62838 Other muscle spasm: Secondary | ICD-10-CM | POA: Diagnosis not present

## 2018-02-06 DIAGNOSIS — I69351 Hemiplegia and hemiparesis following cerebral infarction affecting right dominant side: Secondary | ICD-10-CM | POA: Diagnosis not present

## 2018-02-19 DIAGNOSIS — H25812 Combined forms of age-related cataract, left eye: Secondary | ICD-10-CM | POA: Diagnosis not present

## 2018-02-19 DIAGNOSIS — H524 Presbyopia: Secondary | ICD-10-CM | POA: Diagnosis not present

## 2018-02-19 DIAGNOSIS — H401132 Primary open-angle glaucoma, bilateral, moderate stage: Secondary | ICD-10-CM | POA: Diagnosis not present

## 2018-02-19 DIAGNOSIS — H40033 Anatomical narrow angle, bilateral: Secondary | ICD-10-CM | POA: Diagnosis not present

## 2018-02-19 DIAGNOSIS — Z961 Presence of intraocular lens: Secondary | ICD-10-CM | POA: Diagnosis not present

## 2018-02-19 DIAGNOSIS — H1132 Conjunctival hemorrhage, left eye: Secondary | ICD-10-CM | POA: Diagnosis not present

## 2018-02-19 DIAGNOSIS — H52223 Regular astigmatism, bilateral: Secondary | ICD-10-CM | POA: Diagnosis not present

## 2018-02-19 DIAGNOSIS — H5203 Hypermetropia, bilateral: Secondary | ICD-10-CM | POA: Diagnosis not present

## 2018-02-19 DIAGNOSIS — E119 Type 2 diabetes mellitus without complications: Secondary | ICD-10-CM | POA: Diagnosis not present

## 2018-02-22 DIAGNOSIS — E1122 Type 2 diabetes mellitus with diabetic chronic kidney disease: Secondary | ICD-10-CM | POA: Diagnosis not present

## 2018-02-22 DIAGNOSIS — I10 Essential (primary) hypertension: Secondary | ICD-10-CM | POA: Diagnosis not present

## 2018-02-22 DIAGNOSIS — E1165 Type 2 diabetes mellitus with hyperglycemia: Secondary | ICD-10-CM | POA: Diagnosis not present

## 2018-02-22 DIAGNOSIS — E870 Hyperosmolality and hypernatremia: Secondary | ICD-10-CM | POA: Diagnosis not present

## 2018-02-26 DIAGNOSIS — N3946 Mixed incontinence: Secondary | ICD-10-CM | POA: Diagnosis not present

## 2018-02-27 DIAGNOSIS — C7A029 Malignant carcinoid tumor of the large intestine, unspecified portion: Secondary | ICD-10-CM | POA: Diagnosis not present

## 2018-02-27 DIAGNOSIS — I10 Essential (primary) hypertension: Secondary | ICD-10-CM | POA: Diagnosis not present

## 2018-02-27 DIAGNOSIS — E782 Mixed hyperlipidemia: Secondary | ICD-10-CM | POA: Diagnosis not present

## 2018-02-27 DIAGNOSIS — Z6841 Body Mass Index (BMI) 40.0 and over, adult: Secondary | ICD-10-CM | POA: Diagnosis not present

## 2018-02-27 DIAGNOSIS — I5022 Chronic systolic (congestive) heart failure: Secondary | ICD-10-CM | POA: Diagnosis not present

## 2018-02-27 DIAGNOSIS — I255 Ischemic cardiomyopathy: Secondary | ICD-10-CM | POA: Diagnosis not present

## 2018-02-27 DIAGNOSIS — E1165 Type 2 diabetes mellitus with hyperglycemia: Secondary | ICD-10-CM | POA: Diagnosis not present

## 2018-02-27 DIAGNOSIS — I240 Acute coronary thrombosis not resulting in myocardial infarction: Secondary | ICD-10-CM | POA: Diagnosis not present

## 2018-03-11 ENCOUNTER — Telehealth: Payer: Self-pay | Admitting: Cardiovascular Disease

## 2018-03-11 DIAGNOSIS — R011 Cardiac murmur, unspecified: Secondary | ICD-10-CM

## 2018-03-11 NOTE — Telephone Encounter (Signed)
Patient has upcoming appointment on 06-07-18 with Dr. Bronson Ing. She states that her PCP Dr. Pleas Koch saw her on 02-27-18 and detected a murmur. Patient was told to contact Dr. Bronson Ing about doing Echo/Dopplers before office visit.

## 2018-03-11 NOTE — Telephone Encounter (Signed)
Last echo done 2015.  She is scheduled for her 1 year 06/07/2018 here in [place].

## 2018-03-11 NOTE — Telephone Encounter (Signed)
Can get echo now.

## 2018-03-11 NOTE — Telephone Encounter (Signed)
Patient notified.  Will put order in & send to [facility] for scheduling.

## 2018-03-13 ENCOUNTER — Telehealth: Payer: Self-pay | Admitting: Cardiovascular Disease

## 2018-03-13 NOTE — Telephone Encounter (Signed)
Pre-cert Verification for the following procedure   Echo scheduled 03-28-2018

## 2018-03-19 ENCOUNTER — Other Ambulatory Visit: Payer: Self-pay | Admitting: Cardiovascular Disease

## 2018-03-19 DIAGNOSIS — N3946 Mixed incontinence: Secondary | ICD-10-CM | POA: Diagnosis not present

## 2018-03-28 ENCOUNTER — Ambulatory Visit (INDEPENDENT_AMBULATORY_CARE_PROVIDER_SITE_OTHER): Payer: Medicare HMO

## 2018-03-28 ENCOUNTER — Other Ambulatory Visit: Payer: Self-pay

## 2018-03-28 DIAGNOSIS — R011 Cardiac murmur, unspecified: Secondary | ICD-10-CM

## 2018-04-04 ENCOUNTER — Telehealth: Payer: Self-pay | Admitting: *Deleted

## 2018-04-04 NOTE — Telephone Encounter (Signed)
Notes recorded by Laurine Blazer, LPN on 2/92/4462 at 8:63 PM EDT Patient notified. Copy to pmd. Follow up scheduled for 06/07/2018.   ------  Notes recorded by Laurine Blazer, LPN on 03/24/7115 at 5:79 PM EDT Busy. ------  Notes recorded by Herminio Commons, MD on 03/30/2018 at 10:52 AM EDT Pumping function has improved since prior study and is now closer to the normal range. No significant valve problems seen to explain murmur heard by PCP. Please send a copy of echo report to PCP.

## 2018-04-12 ENCOUNTER — Ambulatory Visit: Payer: Medicare HMO | Admitting: Urology

## 2018-04-12 DIAGNOSIS — N3944 Nocturnal enuresis: Secondary | ICD-10-CM | POA: Diagnosis not present

## 2018-04-12 DIAGNOSIS — N3946 Mixed incontinence: Secondary | ICD-10-CM

## 2018-04-12 DIAGNOSIS — R351 Nocturia: Secondary | ICD-10-CM

## 2018-04-25 DIAGNOSIS — H2512 Age-related nuclear cataract, left eye: Secondary | ICD-10-CM | POA: Diagnosis not present

## 2018-04-25 DIAGNOSIS — H401131 Primary open-angle glaucoma, bilateral, mild stage: Secondary | ICD-10-CM | POA: Diagnosis not present

## 2018-05-24 DIAGNOSIS — E782 Mixed hyperlipidemia: Secondary | ICD-10-CM | POA: Diagnosis not present

## 2018-05-24 DIAGNOSIS — N183 Chronic kidney disease, stage 3 (moderate): Secondary | ICD-10-CM | POA: Diagnosis not present

## 2018-05-24 DIAGNOSIS — E1165 Type 2 diabetes mellitus with hyperglycemia: Secondary | ICD-10-CM | POA: Diagnosis not present

## 2018-05-24 DIAGNOSIS — C7A029 Malignant carcinoid tumor of the large intestine, unspecified portion: Secondary | ICD-10-CM | POA: Diagnosis not present

## 2018-05-24 DIAGNOSIS — E039 Hypothyroidism, unspecified: Secondary | ICD-10-CM | POA: Diagnosis not present

## 2018-05-24 DIAGNOSIS — I10 Essential (primary) hypertension: Secondary | ICD-10-CM | POA: Diagnosis not present

## 2018-05-24 DIAGNOSIS — E1122 Type 2 diabetes mellitus with diabetic chronic kidney disease: Secondary | ICD-10-CM | POA: Diagnosis not present

## 2018-05-24 DIAGNOSIS — E870 Hyperosmolality and hypernatremia: Secondary | ICD-10-CM | POA: Diagnosis not present

## 2018-05-24 DIAGNOSIS — R011 Cardiac murmur, unspecified: Secondary | ICD-10-CM | POA: Diagnosis not present

## 2018-06-03 DIAGNOSIS — I10 Essential (primary) hypertension: Secondary | ICD-10-CM | POA: Diagnosis not present

## 2018-06-03 DIAGNOSIS — Z23 Encounter for immunization: Secondary | ICD-10-CM | POA: Diagnosis not present

## 2018-06-03 DIAGNOSIS — E782 Mixed hyperlipidemia: Secondary | ICD-10-CM | POA: Diagnosis not present

## 2018-06-03 DIAGNOSIS — E1122 Type 2 diabetes mellitus with diabetic chronic kidney disease: Secondary | ICD-10-CM | POA: Diagnosis not present

## 2018-06-03 DIAGNOSIS — I255 Ischemic cardiomyopathy: Secondary | ICD-10-CM | POA: Diagnosis not present

## 2018-06-03 DIAGNOSIS — Z6841 Body Mass Index (BMI) 40.0 and over, adult: Secondary | ICD-10-CM | POA: Diagnosis not present

## 2018-06-03 DIAGNOSIS — I5022 Chronic systolic (congestive) heart failure: Secondary | ICD-10-CM | POA: Diagnosis not present

## 2018-06-03 DIAGNOSIS — N183 Chronic kidney disease, stage 3 (moderate): Secondary | ICD-10-CM | POA: Diagnosis not present

## 2018-06-07 ENCOUNTER — Ambulatory Visit: Payer: Medicare HMO | Admitting: Cardiovascular Disease

## 2018-06-27 ENCOUNTER — Ambulatory Visit: Payer: Medicare HMO | Admitting: Cardiovascular Disease

## 2018-07-16 ENCOUNTER — Other Ambulatory Visit: Payer: Self-pay | Admitting: Cardiovascular Disease

## 2018-07-17 DIAGNOSIS — H2512 Age-related nuclear cataract, left eye: Secondary | ICD-10-CM | POA: Diagnosis not present

## 2018-07-17 DIAGNOSIS — H401121 Primary open-angle glaucoma, left eye, mild stage: Secondary | ICD-10-CM | POA: Diagnosis not present

## 2018-07-25 DIAGNOSIS — Z961 Presence of intraocular lens: Secondary | ICD-10-CM | POA: Diagnosis not present

## 2018-07-25 DIAGNOSIS — H25812 Combined forms of age-related cataract, left eye: Secondary | ICD-10-CM | POA: Diagnosis not present

## 2018-08-02 ENCOUNTER — Encounter

## 2018-08-02 ENCOUNTER — Encounter: Payer: Self-pay | Admitting: Cardiology

## 2018-08-02 ENCOUNTER — Ambulatory Visit: Payer: Medicare HMO | Admitting: Cardiology

## 2018-08-02 ENCOUNTER — Ambulatory Visit (INDEPENDENT_AMBULATORY_CARE_PROVIDER_SITE_OTHER): Payer: Managed Care, Other (non HMO) | Admitting: Cardiology

## 2018-08-02 VITALS — BP 136/64 | HR 82 | Ht 60.0 in | Wt 242.4 lb

## 2018-08-02 DIAGNOSIS — I10 Essential (primary) hypertension: Secondary | ICD-10-CM

## 2018-08-02 DIAGNOSIS — I639 Cerebral infarction, unspecified: Secondary | ICD-10-CM | POA: Diagnosis not present

## 2018-08-02 DIAGNOSIS — E119 Type 2 diabetes mellitus without complications: Secondary | ICD-10-CM | POA: Diagnosis not present

## 2018-08-02 DIAGNOSIS — I2510 Atherosclerotic heart disease of native coronary artery without angina pectoris: Secondary | ICD-10-CM

## 2018-08-02 DIAGNOSIS — Z951 Presence of aortocoronary bypass graft: Secondary | ICD-10-CM

## 2018-08-02 DIAGNOSIS — E66813 Obesity, class 3: Secondary | ICD-10-CM | POA: Insufficient documentation

## 2018-08-02 DIAGNOSIS — IMO0001 Reserved for inherently not codable concepts without codable children: Secondary | ICD-10-CM

## 2018-08-02 DIAGNOSIS — E1121 Type 2 diabetes mellitus with diabetic nephropathy: Secondary | ICD-10-CM | POA: Insufficient documentation

## 2018-08-02 DIAGNOSIS — Z794 Long term (current) use of insulin: Secondary | ICD-10-CM

## 2018-08-02 NOTE — Assessment & Plan Note (Signed)
BMI 47- no history of sleep apnea

## 2018-08-02 NOTE — Patient Instructions (Signed)
Medication Instructions:  Your physician recommends that you continue on your current medications as directed. Please refer to the Current Medication list given to you today.  If you need a refill on your cardiac medications before your next appointment, please call your pharmacy.   Lab work: None today If you have labs (blood work) drawn today and your tests are completely normal, you will receive your results only by: Marland Kitchen MyChart Message (if you have MyChart) OR . A paper copy in the mail If you have any lab test that is abnormal or we need to change your treatment, we will call you to review the results.  Testing/Procedures: None today  Follow-Up: At [facility], you and your health needs are our priority.  As part of our continuing mission to provide you with exceptional heart care, we have created designated Provider Care Teams.  These Care Teams include your primary Cardiologist (physician) and Advanced Practice Providers (APPs -  Physician Assistants and Nurse Practitioners) who all work together to provide you with the care you need, when you need it. You will need a follow up appointment in 1 years.  Please call our office 2 months in advance to schedule this appointment.  You may see Kate Sable, MD or one of the following Advanced Practice Providers on your designated Care Team:   Bernerd Pho, PA-C [facility]) . Ermalinda Barrios, PA-C ([place])  Any Other Special Instructions Will Be Listed Below (If Applicable). None

## 2018-08-02 NOTE — Assessment & Plan Note (Signed)
Grade 1 DD with an EF of 50% on her last echo Aug 2019

## 2018-08-02 NOTE — Assessment & Plan Note (Addendum)
Lt brain CVA April 2010 with resultant right hemiparesis. On chronic Plavix 1-39% ICA strenosis

## 2018-08-02 NOTE — Progress Notes (Signed)
08/02/2018 Erica [name]   [birth year]  [id number]  Primary Physician Burdine, Virgina Evener, MD Primary Cardiologist: Dr Bronson Ing  HPI: The patient is a delightful 67 year old female from [place] who is here in the office for an annual follow-up.  Patient had bypass grafting x3 in March 2014.  She has not required subsequent ischemic evaluation.  In April 2010 she had a left brain stroke with resultant right-sided hemiparesis.  She has only mild carotid disease by Doppler and had a negative Holter study.  Other medical issues include insulin-dependent diabetes, hypertension, dyslipidemia, and morbid obesity.  In the past she had an ejection fraction of 40 to 45% but her most recent echocardiogram in August 2018 showed her ejection fraction to be 50% with grade 1 diastolic dysfunction.  Since her last office visit the patient says she is done well from a medical standpoint.  She had cataract surgery in December.  Unfortunately her husband has throat cancer.  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  Her primary care provider follows her renal function, she says her most recent test looked fine.  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  The patient denies any chest pain.     Current Outpatient Medications  Medication Sig Dispense Refill  . cholecalciferol (VITAMIN D) 1000 UNITS tablet Take 1,000 Units by mouth daily.    . clopidogrel (PLAVIX) 75 MG tablet Take 1 tablet (75 mg total) by mouth daily. 30 tablet 6  . colestipol (COLESTID) 1 g tablet TAKE 1 TABLET BY MOUTH 30 MINUTES BEFORE BREAKFAST AND SUPPER 60 tablet 6  . insulin NPH-regular (NOVOLIN 70/30) (70-30) 100 UNIT/ML injection Inject 65 Units into the skin 2 (two) times daily with a meal. 45 units in am / 20 units at night    . lisinopril (PRINIVIL,ZESTRIL) 20 MG tablet Take 20 mg by mouth daily.    . Magnesium 400 MG CAPS Take 1 capsule by mouth daily. 30 capsule 6  . metFORMIN (GLUCOPHAGE) 1000 MG  tablet Take 1,000 mg by mouth 2 (two) times daily with a meal.    . metoprolol tartrate (LOPRESSOR) 25 MG tablet Take 12.5 mg by mouth 2 (two) times daily.    . Multiple Vitamin (MULTIVITAMIN) tablet Take 1 tablet by mouth daily.    . nitroGLYCERIN (NITROSTAT) 0.4 MG SL tablet Place 1 tablet (0.4 mg total) under the tongue every 5 (five) minutes as needed for chest pain. 25 tablet 3  . omeprazole (PRILOSEC) 20 MG capsule TAKE 1 CAPSULE BY MOUTH EVERY DAY 90 capsule 0  . potassium chloride SA (K-DUR,KLOR-CON) 20 MEQ tablet TAKE ONE TABLET BY MOUTH EVERY DAY 90 tablet 1  . simvastatin (ZOCOR) 40 MG tablet Take 40 mg by mouth every evening.    Marland Kitchen tiZANidine (ZANAFLEX) 4 MG tablet Take 4 mg by mouth daily.     Marland Kitchen torsemide (DEMADEX) 20 MG tablet TAKE 1 TABLET BY MOUTH DAILY 30 tablet 0  . travoprost, benzalkonium, (TRAVATAN) 0.004 % ophthalmic solution Place 1 drop into both eyes at bedtime.     No current facility-administered medications for this visit.     Allergies  Allergen Reactions  . Bee Venom   . Codeine Nausea Only and Other (See Comments)    Other reaction(s): Other (See Comments) headache    Past Medical History:  Diagnosis Date  . CAD (coronary artery disease)   . Carcinoid tumor   . Chest pain   . Colon  cancer ([place]) 2008  . DM (diabetes mellitus) ([place])   . HTN (hypertension)   . MI (myocardial infarction) ([place]) FEB 2014   Acute MI of anterior wall  . Pulmonary edema   . Stroke [facility]) 2010 RIGHT SIDED WEAKNESS    Social History   Socioeconomic History  . Marital status: Divorced    Spouse name: Not on file  . Number of children: Not on file  . Years of education: Not on file  . Highest education level: Not on file  Occupational History  . Not on file  Social Needs  . Financial resource strain: Not on file  . Food insecurity:    Worry: Not on file    Inability: Not on file  . Transportation needs:    Medical: Not on file    Non-medical: Not on file  Tobacco  Use  . Smoking status: Never Smoker  . Smokeless tobacco: Never Used  Substance and Sexual Activity  . Alcohol use: No    Alcohol/week: 0.0 standard drinks  . Drug use: No  . Sexual activity: Not on file  Lifestyle  . Physical activity:    Days per week: Not on file    Minutes per session: Not on file  . Stress: Not on file  Relationships  . Social connections:    Talks on phone: Not on file    Gets together: Not on file    Attends religious service: Not on file    Active member of club or organization: Not on file    Attends meetings of clubs or organizations: Not on file    Relationship status: Not on file  . Intimate partner violence:    Fear of current or ex partner: Not on file    Emotionally abused: Not on file    Physically abused: Not on file    Forced sexual activity: Not on file  Other Topics Concern  . Not on file  Social History Narrative  . Not on file     Family History  Problem Relation Age of Onset  . Diabetes Mother   . Heart disease Mother   . Colon cancer Paternal Uncle   . Colon polyps Neg Hx      Review of Systems: General: negative for chills, fever, night sweats or weight changes.  Cardiovascular: negative for chest pain, dyspnea on exertion, edema, orthopnea, palpitations, paroxysmal nocturnal dyspnea or shortness of breath Dermatological: negative for rash Respiratory: negative for cough or wheezing Urologic: negative for hematuria Abdominal: negative for nausea, vomiting, diarrhea, bright red blood per rectum, melena, or hematemesis Neurologic: negative for visual changes, syncope, or dizziness All other systems reviewed and are otherwise negative except as noted above.    Blood pressure 136/64, pulse 82, height 5' (1.524 m), weight 242 lb 6.4 oz (110 kg).  General appearance: alert, cooperative, no distress and morbidly obese Neck: no carotid bruit and no JVD Lungs: clear to auscultation bilaterally Heart: regular rate and  rhythm Extremities: brace on Rt leg, no edema Skin: Skin color, texture, turgor normal. No rashes or lesions Neurologic: Grossly normal, rt hemiparesis  EKG NSR, HR 82, poor anterior RW  ASSESSMENT AND PLAN:   S/P CABG x 3 CABG x 07 October 2012  CVA (cerebral vascular accident) [facility]) Lt brain CVA April 2010 with resultant right hemiparesis. On chronic Plavix 1-39% ICA strenosis  Essential hypertension Grade 1 DD with an EF of 50% on her last echo Aug 2019  Insulin dependent diabetes mellitus (  [place]) PCP follows her renal function- she has congential solitary kidney with normal renal function per her history.   Morbid obesity (HCC) BMI 47- no history of sleep apnea   PLAN  No change in Rx- f/u in one year.  Kerin Ransom PA-C 08/02/2018 10:21 AM

## 2018-08-02 NOTE — Assessment & Plan Note (Signed)
CABG x 07 October 2012

## 2018-08-02 NOTE — Assessment & Plan Note (Signed)
PCP follows her renal function- she has congential solitary kidney with normal renal function per her history.

## 2018-08-15 ENCOUNTER — Other Ambulatory Visit: Payer: Self-pay | Admitting: Cardiovascular Disease

## 2018-08-28 DIAGNOSIS — E870 Hyperosmolality and hypernatremia: Secondary | ICD-10-CM | POA: Diagnosis not present

## 2018-08-28 DIAGNOSIS — E782 Mixed hyperlipidemia: Secondary | ICD-10-CM | POA: Diagnosis not present

## 2018-08-28 DIAGNOSIS — I255 Ischemic cardiomyopathy: Secondary | ICD-10-CM | POA: Diagnosis not present

## 2018-08-28 DIAGNOSIS — N189 Chronic kidney disease, unspecified: Secondary | ICD-10-CM | POA: Diagnosis not present

## 2018-08-28 DIAGNOSIS — E1122 Type 2 diabetes mellitus with diabetic chronic kidney disease: Secondary | ICD-10-CM | POA: Diagnosis not present

## 2018-08-28 DIAGNOSIS — E1165 Type 2 diabetes mellitus with hyperglycemia: Secondary | ICD-10-CM | POA: Diagnosis not present

## 2018-08-28 DIAGNOSIS — E039 Hypothyroidism, unspecified: Secondary | ICD-10-CM | POA: Diagnosis not present

## 2018-08-28 DIAGNOSIS — I5022 Chronic systolic (congestive) heart failure: Secondary | ICD-10-CM | POA: Diagnosis not present

## 2018-08-28 DIAGNOSIS — I10 Essential (primary) hypertension: Secondary | ICD-10-CM | POA: Diagnosis not present

## 2018-09-04 DIAGNOSIS — N183 Chronic kidney disease, stage 3 (moderate): Secondary | ICD-10-CM | POA: Diagnosis not present

## 2018-09-04 DIAGNOSIS — I10 Essential (primary) hypertension: Secondary | ICD-10-CM | POA: Diagnosis not present

## 2018-09-04 DIAGNOSIS — E782 Mixed hyperlipidemia: Secondary | ICD-10-CM | POA: Diagnosis not present

## 2018-09-04 DIAGNOSIS — E1122 Type 2 diabetes mellitus with diabetic chronic kidney disease: Secondary | ICD-10-CM | POA: Diagnosis not present

## 2018-09-04 DIAGNOSIS — I5022 Chronic systolic (congestive) heart failure: Secondary | ICD-10-CM | POA: Diagnosis not present

## 2018-09-04 DIAGNOSIS — Z6841 Body Mass Index (BMI) 40.0 and over, adult: Secondary | ICD-10-CM | POA: Diagnosis not present

## 2018-09-04 DIAGNOSIS — N39498 Other specified urinary incontinence: Secondary | ICD-10-CM | POA: Diagnosis not present

## 2018-09-04 DIAGNOSIS — I255 Ischemic cardiomyopathy: Secondary | ICD-10-CM | POA: Diagnosis not present

## 2018-09-15 ENCOUNTER — Other Ambulatory Visit: Payer: Self-pay | Admitting: Cardiovascular Disease

## 2018-09-28 DIAGNOSIS — R0602 Shortness of breath: Secondary | ICD-10-CM | POA: Diagnosis not present

## 2018-09-28 DIAGNOSIS — E785 Hyperlipidemia, unspecified: Secondary | ICD-10-CM | POA: Diagnosis not present

## 2018-09-28 DIAGNOSIS — T68XXXA Hypothermia, initial encounter: Secondary | ICD-10-CM | POA: Diagnosis not present

## 2018-09-28 DIAGNOSIS — E161 Other hypoglycemia: Secondary | ICD-10-CM | POA: Diagnosis not present

## 2018-09-28 DIAGNOSIS — I110 Hypertensive heart disease with heart failure: Secondary | ICD-10-CM | POA: Diagnosis not present

## 2018-09-28 DIAGNOSIS — X31XXXA Exposure to excessive natural cold, initial encounter: Secondary | ICD-10-CM | POA: Diagnosis not present

## 2018-09-28 DIAGNOSIS — E162 Hypoglycemia, unspecified: Secondary | ICD-10-CM | POA: Diagnosis not present

## 2018-09-28 DIAGNOSIS — R52 Pain, unspecified: Secondary | ICD-10-CM | POA: Diagnosis not present

## 2018-09-28 DIAGNOSIS — R0902 Hypoxemia: Secondary | ICD-10-CM | POA: Diagnosis not present

## 2018-09-28 DIAGNOSIS — J90 Pleural effusion, not elsewhere classified: Secondary | ICD-10-CM | POA: Diagnosis not present

## 2018-09-28 DIAGNOSIS — R4182 Altered mental status, unspecified: Secondary | ICD-10-CM | POA: Diagnosis not present

## 2018-09-28 DIAGNOSIS — E1165 Type 2 diabetes mellitus with hyperglycemia: Secondary | ICD-10-CM | POA: Diagnosis not present

## 2018-09-28 DIAGNOSIS — K219 Gastro-esophageal reflux disease without esophagitis: Secondary | ICD-10-CM | POA: Diagnosis not present

## 2018-09-28 DIAGNOSIS — I5022 Chronic systolic (congestive) heart failure: Secondary | ICD-10-CM | POA: Diagnosis not present

## 2018-09-28 DIAGNOSIS — I482 Chronic atrial fibrillation, unspecified: Secondary | ICD-10-CM | POA: Diagnosis not present

## 2018-09-28 DIAGNOSIS — I2510 Atherosclerotic heart disease of native coronary artery without angina pectoris: Secondary | ICD-10-CM | POA: Diagnosis not present

## 2018-09-28 DIAGNOSIS — M5489 Other dorsalgia: Secondary | ICD-10-CM | POA: Diagnosis not present

## 2018-09-28 DIAGNOSIS — E11649 Type 2 diabetes mellitus with hypoglycemia without coma: Secondary | ICD-10-CM | POA: Diagnosis not present

## 2018-09-28 DIAGNOSIS — D649 Anemia, unspecified: Secondary | ICD-10-CM | POA: Diagnosis not present

## 2018-09-28 DIAGNOSIS — R55 Syncope and collapse: Secondary | ICD-10-CM | POA: Diagnosis not present

## 2018-09-28 DIAGNOSIS — M79641 Pain in right hand: Secondary | ICD-10-CM | POA: Diagnosis not present

## 2018-09-28 DIAGNOSIS — R531 Weakness: Secondary | ICD-10-CM | POA: Diagnosis not present

## 2018-09-29 DIAGNOSIS — E079 Disorder of thyroid, unspecified: Secondary | ICD-10-CM | POA: Diagnosis not present

## 2018-09-29 DIAGNOSIS — T68XXXA Hypothermia, initial encounter: Secondary | ICD-10-CM | POA: Diagnosis not present

## 2018-09-29 DIAGNOSIS — D649 Anemia, unspecified: Secondary | ICD-10-CM | POA: Diagnosis not present

## 2018-09-29 DIAGNOSIS — E11649 Type 2 diabetes mellitus with hypoglycemia without coma: Secondary | ICD-10-CM | POA: Diagnosis not present

## 2018-09-29 DIAGNOSIS — I509 Heart failure, unspecified: Secondary | ICD-10-CM | POA: Diagnosis not present

## 2018-09-30 DIAGNOSIS — I482 Chronic atrial fibrillation, unspecified: Secondary | ICD-10-CM | POA: Diagnosis not present

## 2018-09-30 DIAGNOSIS — E11649 Type 2 diabetes mellitus with hypoglycemia without coma: Secondary | ICD-10-CM | POA: Diagnosis not present

## 2018-09-30 DIAGNOSIS — T68XXXA Hypothermia, initial encounter: Secondary | ICD-10-CM | POA: Diagnosis not present

## 2018-09-30 DIAGNOSIS — E010 Iodine-deficiency related diffuse (endemic) goiter: Secondary | ICD-10-CM | POA: Diagnosis not present

## 2018-09-30 DIAGNOSIS — I5022 Chronic systolic (congestive) heart failure: Secondary | ICD-10-CM | POA: Diagnosis not present

## 2018-09-30 DIAGNOSIS — I10 Essential (primary) hypertension: Secondary | ICD-10-CM | POA: Diagnosis not present

## 2018-09-30 DIAGNOSIS — I340 Nonrheumatic mitral (valve) insufficiency: Secondary | ICD-10-CM | POA: Diagnosis not present

## 2018-09-30 DIAGNOSIS — E785 Hyperlipidemia, unspecified: Secondary | ICD-10-CM | POA: Diagnosis not present

## 2018-10-07 ENCOUNTER — Telehealth: Payer: Self-pay | Admitting: Cardiovascular Disease

## 2018-10-07 DIAGNOSIS — N183 Chronic kidney disease, stage 3 (moderate): Secondary | ICD-10-CM | POA: Diagnosis not present

## 2018-10-07 DIAGNOSIS — I5022 Chronic systolic (congestive) heart failure: Secondary | ICD-10-CM | POA: Diagnosis not present

## 2018-10-07 DIAGNOSIS — R600 Localized edema: Secondary | ICD-10-CM | POA: Diagnosis not present

## 2018-10-07 DIAGNOSIS — I255 Ischemic cardiomyopathy: Secondary | ICD-10-CM | POA: Diagnosis not present

## 2018-10-07 DIAGNOSIS — E11649 Type 2 diabetes mellitus with hypoglycemia without coma: Secondary | ICD-10-CM | POA: Diagnosis not present

## 2018-10-07 DIAGNOSIS — E1122 Type 2 diabetes mellitus with diabetic chronic kidney disease: Secondary | ICD-10-CM | POA: Diagnosis not present

## 2018-10-07 DIAGNOSIS — R55 Syncope and collapse: Secondary | ICD-10-CM | POA: Diagnosis not present

## 2018-10-07 DIAGNOSIS — E039 Hypothyroidism, unspecified: Secondary | ICD-10-CM | POA: Diagnosis not present

## 2018-10-07 DIAGNOSIS — E782 Mixed hyperlipidemia: Secondary | ICD-10-CM | POA: Diagnosis not present

## 2018-10-07 DIAGNOSIS — I700 Atherosclerosis of aorta: Secondary | ICD-10-CM | POA: Diagnosis not present

## 2018-10-07 DIAGNOSIS — I69351 Hemiplegia and hemiparesis following cerebral infarction affecting right dominant side: Secondary | ICD-10-CM | POA: Diagnosis not present

## 2018-10-07 DIAGNOSIS — I10 Essential (primary) hypertension: Secondary | ICD-10-CM | POA: Diagnosis not present

## 2018-10-07 DIAGNOSIS — C7A029 Malignant carcinoid tumor of the large intestine, unspecified portion: Secondary | ICD-10-CM | POA: Diagnosis not present

## 2018-10-07 DIAGNOSIS — Z6841 Body Mass Index (BMI) 40.0 and over, adult: Secondary | ICD-10-CM | POA: Diagnosis not present

## 2018-10-07 NOTE — Telephone Encounter (Signed)
I called and left a VM on his cell.

## 2018-10-07 NOTE — Telephone Encounter (Signed)
Received telephone call from Dr. Pleas Koch at [place]. He is requesting to speak with Dr. Bronson Ing in regards to mutual patient. Dr. Pleas Koch states that patient has several issues to discuss with Dr. Pleas Koch. Patient was recently admitted to [facility] for syncope, Had recent echo with EF 25%, new onset of Atrial Fibrillation noted while in the office today.  Please call [phone] or cell # [phone].

## 2018-10-17 NOTE — Progress Notes (Signed)
Cardiology Office Note    Date:  10/18/2018   ID:  NEREA BORDENAVE, DOB [birth year], MRN [id number]  PCP:  Curlene Labrum, MD  Cardiologist: Kate Sable, MD    Chief Complaint  Patient presents with  . Hospitalization Follow-up    diagnosed with new-onset atrial fibrillation and CHF at [facility]    History of Present Illness:    Erica [name] is a 68 y.o. female with past medical history of CAD (s/p CABG in 10/2012), carotid artery stenosis, chronic combined systolic and diastolic CHF (EF previously 40%, at 50% by repeat imaging in 03/2018), HTN, HLD, IDDM, Stage 3 CKD, and prior CVA who presents to the office today for hospital follow-up.  She was last examined by Kerin Ransom, PA-C in 07/2018 and reported overall doing well from a cardiac perspective at that time. She denied any recent chest pain or dyspnea on exertion. 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   By review of records, she was recently admitted to [facility] on 09/28/2018 for evaluation of worsening dyspnea and episodes of hypoglycemia (glucose at 38 while in the ED). Initial EKG showed she was in rate controlled atrial fibrillation and chest CT showed mild bilateral central groundglass opacities and mild bilateral septal thickening suggestive of pulmonary edema. Was also noted to have small bilateral pleural effusions, cardiomegaly, and aortic atherosclerosis. Initial troponin was negative and BNP was found to be elevated to 6207. Creatinine was 1.71 and Hgb 8.8. The discharge summary mentions her EF was in the 30's by echocardiogram but the echocardiogram report was not faxed over.   In talking with the patient, she reports overall doing well since hospital discharge. She was overall unaware of her arrhythmia and denies any recent palpitations. She was not started on anticoagulation while in the hospital but did follow-up with her PCP last  week and Plavix was discontinued with her being initiated on Eliquis 5 mg twice daily. She does not have a blood pressure cuff at home and has been unable to monitor her BP or HR. HR initially elevated to 104 during today's visit, improving into the 80's with rest.  She denies any recent dyspnea on exertion, orthopnea, or PND. She has recently experienced worsening lower extremity edema and had been instructed by her PCP to further titrate Torsemide from 20 mg daily to 40 mg twice daily but she did not do this due to concerns for kidney function.    Past Medical History:  Diagnosis Date  . Atrial fibrillation ([place])    a. diagnosed in 09/2018  . CAD (coronary artery disease)    a. s/p CABG in 10/2012  . Carcinoid tumor   . Chest pain   . CHF (congestive heart failure) (HCC)    a. EF previously 40%, at 50% by repeat imaging in 03/2018  . Colon cancer ([place]) 2008  . DM (diabetes mellitus) ([place])   . HTN (hypertension)   . MI (myocardial infarction) ([place]) FEB 2014   Acute MI of anterior wall  . Pulmonary edema   . Stroke [facility]) 2010 RIGHT SIDED WEAKNESS    Past Surgical History:  Procedure Laterality Date  . BACTERIAL OVERGROWTH TEST N/A 03/18/2015   Procedure: BACTERIAL OVERGROWTH TEST;  Surgeon: Danie Binder, MD;  Location: [facility]O SUITE;  Service: Endoscopy;  Laterality: N/A;  0800  . CARDIAC CATHETERIZATION  08/2008  . CHOLECYSTECTOMY    . COLONOSCOPY  N/A 03/03/2015   SLF: 1. One colon polyp removed 2. Mild diverticulosis in the sigmoid colon 3. Small internal hemorrhoids  . CORONARY ARTERY BYPASS GRAFT  10/08/2012  . SHOULDER SURGERY    . TUBAL LIGATION      Current Medications: Outpatient Medications Prior to Visit  Medication Sig Dispense Refill  . cholecalciferol (VITAMIN D) 1000 UNITS tablet Take 1,000 Units by mouth daily.    . colestipol (COLESTID) 1 g tablet TAKE 1 TABLET BY MOUTH 30 MINUTES BEFORE BREAKFAST AND SUPPER 60 tablet 6  . ELIQUIS 5 MG TABS tablet Take 5 mg by  mouth 2 (two) times daily.    . insulin NPH-regular (NOVOLIN 70/30) (70-30) 100 UNIT/ML injection Inject 65 Units into the skin 2 (two) times daily with a meal. 45 units in am / 20 units at night    . lisinopril (PRINIVIL,ZESTRIL) 20 MG tablet Take 20 mg by mouth daily.    . Magnesium 400 MG CAPS Take 1 capsule by mouth daily. 30 capsule 6  . metFORMIN (GLUCOPHAGE) 1000 MG tablet Take 1,000 mg by mouth 2 (two) times daily with a meal.    . Multiple Vitamin (MULTIVITAMIN) tablet Take 1 tablet by mouth daily.    . nitroGLYCERIN (NITROSTAT) 0.4 MG SL tablet Place 1 tablet (0.4 mg total) under the tongue every 5 (five) minutes as needed for chest pain. 25 tablet 3  . omeprazole (PRILOSEC) 20 MG capsule TAKE 1 CAPSULE BY MOUTH EVERY DAY 90 capsule 0  . potassium chloride SA (K-DUR,KLOR-CON) 20 MEQ tablet TAKE 1 TABLET BY MOUTH EVERY DAY 90 tablet 2  . simvastatin (ZOCOR) 40 MG tablet Take 40 mg by mouth every evening.    Marland Kitchen tiZANidine (ZANAFLEX) 4 MG tablet Take 4 mg by mouth daily.     . travoprost, benzalkonium, (TRAVATAN) 0.004 % ophthalmic solution Place 1 drop into both eyes at bedtime.    . clopidogrel (PLAVIX) 75 MG tablet Take 1 tablet (75 mg total) by mouth daily. 30 tablet 6  . furosemide (LASIX) 40 MG tablet TAKE 1 TABLET BY MOUTH EVERY DAY FOR SWELLING IN LEG    . metoprolol tartrate (LOPRESSOR) 25 MG tablet Take 12.5 mg by mouth 2 (two) times daily.    Marland Kitchen torsemide (DEMADEX) 20 MG tablet TAKE 1 TABLET BY MOUTH EVERY DAY 90 tablet 2  . omeprazole (PRILOSEC) 40 MG capsule TAKE ONE CAPSULE BY MOUTH EVERY DAY FOR REFLUX     No facility-administered medications prior to visit.      Allergies:   Bee venom and Codeine   Social History   Socioeconomic History  . Marital status: Divorced    Spouse name: Not on file  . Number of children: Not on file  . Years of education: Not on file  . Highest education level: Not on file  Occupational History  . Not on file  Social Needs  . Financial  resource strain: Not on file  . Food insecurity:    Worry: Not on file    Inability: Not on file  . Transportation needs:    Medical: Not on file    Non-medical: Not on file  Tobacco Use  . Smoking status: Never Smoker  . Smokeless tobacco: Never Used  Substance and Sexual Activity  . Alcohol use: No    Alcohol/week: 0.0 standard drinks  . Drug use: No  . Sexual activity: Not on file  Lifestyle  . Physical activity:    Days per week: Not on file  Minutes per session: Not on file  . Stress: Not on file  Relationships  . Social connections:    Talks on phone: Not on file    Gets together: Not on file    Attends religious service: Not on file    Active member of club or organization: Not on file    Attends meetings of clubs or organizations: Not on file    Relationship status: Not on file  Other Topics Concern  . Not on file  Social History Narrative  . Not on file     Family History:  The patient's family history includes Colon cancer in her paternal uncle; Diabetes in her mother; Heart disease in her mother.   Review of Systems:   Please see the history of present illness.     General:  No chills, fever, night sweats or weight changes.  Cardiovascular:  No chest pain, dyspnea on exertion,  orthopnea, palpitations, paroxysmal nocturnal dyspnea. Positive for edema.  Dermatological: No rash, lesions/masses Respiratory: No cough, dyspnea Urologic: No hematuria, dysuria Abdominal:   No nausea, vomiting, diarrhea, bright red blood per rectum, melena, or hematemesis Neurologic:  No visual changes, changes in mental status. Positive for residual right-sided weakness.  All other systems reviewed and are otherwise negative except as noted above.   Physical Exam:    VS:  BP 122/72 (BP Location: Left Arm)   Pulse (!) 104   Ht 5' (1.524 m)   Wt 248 lb (112.5 kg)   SpO2 91%   BMI 48.43 kg/m    General: Well developed, obese Caucasian female appearing in no acute distress.  Head: Normocephalic, atraumatic, sclera non-icteric, no xanthomas, nares are without discharge.  Neck: No carotid bruits. JVD not elevated.  Lungs: Respirations regular and unlabored, without wheezes or rales.  Heart: Irregularly irregular. No S3 or S4.  No murmur, no rubs, or gallops appreciated. Abdomen: Soft, non-tender, non-distended with normoactive bowel sounds. No hepatomegaly. No rebound/guarding. No obvious abdominal masses. Msk:  Strength and tone appear normal for age. No joint deformities or effusions. Extremities: No clubbing or cyanosis. 1+ pitting edema up to knees bilaterally, R > L. .  Distal pedal pulses are 2+ bilaterally. Neuro: Alert and oriented X 3. Moves all extremities spontaneously. No focal deficits noted. Psych:  Responds to questions appropriately with a normal affect. Skin: No rashes or lesions noted  Wt Readings from Last 3 Encounters:  10/18/18 248 lb (112.5 kg)  08/02/18 242 lb 6.4 oz (110 kg)  06/05/17 248 lb (112.5 kg)     Studies/Labs Reviewed:   EKG:  EKG is not ordered today.    Recent Labs: No results found for requested labs within last 8760 hours.   Lipid Panel No results found for: CHOL, TRIG, HDL, CHOLHDL, VLDL, LDLCALC, LDLDIRECT  Additional studies/ records that were reviewed today include:   Echocardiogram: 03/2018 Study Conclusions  - Left ventricle: The cavity size was normal. Wall thickness was   normal. Systolic function was normal. The estimated ejection   fraction was = 50%. Doppler parameters are consistent with   abnormal left ventricular relaxation (grade 1 diastolic   dysfunction). Doppler parameters are consistent with high   ventricular filling pressure. - Aortic valve: Valve area (VTI): 2.47 cm^2. Valve area (Vmax):   2.15 cm^2. Valve area (Vmean): 2.38 cm^2. - Left atrium: The atrium was moderately dilated. - Technically difficult study.   Assessment:    1. Persistent atrial fibrillation   2. Chronic  combined systolic (congestive)  and diastolic (congestive) heart failure ([place])   3. Coronary artery disease of bypass graft of native heart with stable angina pectoris ([place])   4. Essential hypertension   5. Hyperlipidemia LDL goal <70   6. CKD (chronic kidney disease) stage 3, GFR 30-59 ml/min (HCC)      Plan:   In order of problems listed above:  1. Persistent Atrial Fibrillation - Was a new diagnosis for the patient during her recent hospitalization for hypoglycemia and an acute CHF exacerbation. Rates have been variable in the 80's to low 100's during today's visit but she denies any associated symptoms. - She is currently on Lopressor 12.5 mg twice daily. Will stop this and switch to Toprol-XL 50 mg daily for improved rate control and in the setting of her cardiomyopathy. She does not have a BP cuff at home. Will arrange for a Nurse Visit in 2 weeks for repeat EKG along with BP check. Can further titrate Toprol-XL if BP allows and HR remains elevated. She has only been on Eliquis for 1 week at this time. If she remains in atrial fibrillation, could consider DCCV following 3 weeks of uninterrupted anticoagulation. - Denies any evidence of active bleeding. Continue Eliquis 5 mg twice daily for anticoagulation.    2. Chronic Combined Systolic and Diastolic CHF - She has a history of ischemic cardiomyopathy and EF was previously 40% but had improved to 50% by repeat imaging in 03/2018. Recent echocardiogram showed that her EF was in the 30's in the setting of new onset atrial fibrillation. Will request the official echocardiogram report. - Her breathing has been at baseline but she continues to experience worsening lower extremity edema. Following a low-sodium diet. Will plan to titrate Torsemide to 20 mg twice daily. Will repeat a BMET in 2 weeks.  - will change Lopressor to Toprol-XL in the setting of her cardiomyopathy. Continue ACE-I for now (possible transition to [facility] in the near future  pending BP adjustments with rate-control and review of her official echo).   3. CAD - s/p CABG in 10/2012. She denies any recent chest pain or dyspnea on exertion. Recent echocardiogram at outside hospital was reported as showing her EF was in the 30's. Will request the official report today.  Would recommend titration of her medical regimen over the next few months and repeat echocardiogram once heart rate has improved. If EF remains reduced, would consider repeat ischemic evaluation. - Continue beta-blocker therapy and statin. She is no longer on ASA given the need for anticoagulation.  4. HTN - BP is well controlled at 122/72 during today's visit. She is currently on Lisinopril 20 mg daily and Lopressor 12.5 mg twice daily. Will plan to transition Lopressor to Toprol-XL 50 mg daily as outlined above.  5. HLD - Followed by PCP. She remains on Simvastatin 40 mg daily. Goal LDL is less than 70 in the setting of known CAD.  6. Stage 3 CKD - creatinine variable from 1.6 - 1.7 during her recent hospitalization. Will recheck a BMET in 2 weeks following dose adjustment of Torsemide.   Medication Adjustments/Labs and Tests Ordered: Current medicines are reviewed at length with the patient today.  Concerns regarding medicines are outlined above.  Medication changes, Labs and Tests ordered today are listed in the Patient Instructions below. Patient Instructions  Medication Instructions:  STOP Lopressor   START Toprol XL 50 mg daily   INCREASE Torsemide to 20 mg TWICE A DAY  If you need a refill on your cardiac  medications before your next appointment, please call your pharmacy.   Lab work: Probation officer  Lab slip at nurse visit in 2 weeks at [place] office  If you have labs (blood work) drawn today and your tests are completely normal, you will receive your results only by: Marland Kitchen MyChart Message (if you have MyChart) OR . A paper copy in the mail If you have any lab test that is abnormal or we need to  change your treatment, we will call you to review the results.  Testing/Procedures: None today  Follow-Up: At [facility], you and your health needs are our priority.  As part of our continuing mission to provide you with exceptional heart care, we have created designated Provider Care Teams.  These Care Teams include your primary Cardiologist (physician) and Advanced Practice Providers (APPs -  Physician Assistants and Nurse Practitioners) who all work together to provide you with the care you need, when you need it. You will need a follow up appointment in 5 weeks.  Please call our office 2 months in advance to schedule this appointment.  You may see Kate Sable, MD or one of the following Advanced Practice Providers on your designated Care Team:   Bernerd Pho, PA-C [facility]) . Ermalinda Barrios, PA-C ([place])  Any Other Special Instructions Will Be Listed Below (If Applicable). Nurse visit in 2 weeks at [facility] office for HR/BP check    Signed, Erma Heritage, PA-C  10/18/2018 5:25 PM    [place] [address] [place], [place] [address] Phone: [phone] Fax: [phone]

## 2018-10-18 ENCOUNTER — Ambulatory Visit: Payer: Managed Care, Other (non HMO) | Admitting: Student

## 2018-10-18 ENCOUNTER — Encounter: Payer: Self-pay | Admitting: Student

## 2018-10-18 ENCOUNTER — Other Ambulatory Visit: Payer: Self-pay

## 2018-10-18 VITALS — BP 122/72 | HR 104 | Ht 60.0 in | Wt 248.0 lb

## 2018-10-18 DIAGNOSIS — I5042 Chronic combined systolic (congestive) and diastolic (congestive) heart failure: Secondary | ICD-10-CM | POA: Diagnosis not present

## 2018-10-18 DIAGNOSIS — I4819 Other persistent atrial fibrillation: Secondary | ICD-10-CM

## 2018-10-18 DIAGNOSIS — N183 Chronic kidney disease, stage 3 unspecified: Secondary | ICD-10-CM

## 2018-10-18 DIAGNOSIS — I10 Essential (primary) hypertension: Secondary | ICD-10-CM

## 2018-10-18 DIAGNOSIS — I25708 Atherosclerosis of coronary artery bypass graft(s), unspecified, with other forms of angina pectoris: Secondary | ICD-10-CM

## 2018-10-18 DIAGNOSIS — E785 Hyperlipidemia, unspecified: Secondary | ICD-10-CM | POA: Diagnosis not present

## 2018-10-18 MED ORDER — METOPROLOL SUCCINATE ER 50 MG PO TB24: 50.0000 mg | ORAL_TABLET | Freq: Every day | ORAL | 3 refills | Status: DC

## 2018-10-18 MED ORDER — TORSEMIDE 20 MG PO TABS: 20.0000 mg | ORAL_TABLET | Freq: Two times a day (BID) | ORAL | 2 refills | Status: DC

## 2018-10-18 NOTE — Patient Instructions (Addendum)
Medication Instructions:  STOP Lopressor   START Toprol XL 50 mg daily   INCREASE Torsemide to 20 mg TWICE A DAY  If you need a refill on your cardiac medications before your next appointment, please call your pharmacy.   Lab work: Probation officer  Lab slip at nurse visit in 2 weeks at [place] office  If you have labs (blood work) drawn today and your tests are completely normal, you will receive your results only by: Marland Kitchen MyChart Message (if you have MyChart) OR . A paper copy in the mail If you have any lab test that is abnormal or we need to change your treatment, we will call you to review the results.  Testing/Procedures: None today  Follow-Up: At [facility], you and your health needs are our priority.  As part of our continuing mission to provide you with exceptional heart care, we have created designated Provider Care Teams.  These Care Teams include your primary Cardiologist (physician) and Advanced Practice Providers (APPs -  Physician Assistants and Nurse Practitioners) who all work together to provide you with the care you need, when you need it. You will need a follow up appointment in 5 weeks.  Please call our office 2 months in advance to schedule this appointment.  You may see Kate Sable, MD or one of the following Advanced Practice Providers on your designated Care Team:   Bernerd Pho, PA-C [facility]) . Ermalinda Barrios, PA-C ([place])  Any Other Special Instructions Will Be Listed Below (If Applicable). Nurse visit in 2 weeks at [facility] office for HR/BP check

## 2018-10-21 DIAGNOSIS — I69351 Hemiplegia and hemiparesis following cerebral infarction affecting right dominant side: Secondary | ICD-10-CM | POA: Diagnosis not present

## 2018-10-21 DIAGNOSIS — R55 Syncope and collapse: Secondary | ICD-10-CM | POA: Diagnosis not present

## 2018-10-21 DIAGNOSIS — E11649 Type 2 diabetes mellitus with hypoglycemia without coma: Secondary | ICD-10-CM | POA: Diagnosis not present

## 2018-10-21 DIAGNOSIS — E039 Hypothyroidism, unspecified: Secondary | ICD-10-CM | POA: Diagnosis not present

## 2018-10-21 DIAGNOSIS — Z6841 Body Mass Index (BMI) 40.0 and over, adult: Secondary | ICD-10-CM | POA: Diagnosis not present

## 2018-10-21 DIAGNOSIS — I10 Essential (primary) hypertension: Secondary | ICD-10-CM | POA: Diagnosis not present

## 2018-10-21 DIAGNOSIS — I255 Ischemic cardiomyopathy: Secondary | ICD-10-CM | POA: Diagnosis not present

## 2018-10-21 DIAGNOSIS — E1122 Type 2 diabetes mellitus with diabetic chronic kidney disease: Secondary | ICD-10-CM | POA: Diagnosis not present

## 2018-10-31 ENCOUNTER — Telehealth: Payer: Self-pay | Admitting: Cardiovascular Disease

## 2018-10-31 NOTE — Telephone Encounter (Signed)
Patient wants to know if she needs to keep appointment due to her condition.    Also said she was told at last visit, that we would be setting up for her to have lab work

## 2018-10-31 NOTE — Telephone Encounter (Signed)
   Primary Cardiologist:  Kate Sable, MD   Patient contacted. History reviewed. No c/o dizziness or chest pain. Continue to have swelling in right leg and sob that is the same as before and not any worse. Medications reconciled. Based on discussion, with current pandemic situation, we will be postponing this appointment for Erica [name] with a plan for f/u as planned or sooner if feasible/necessary.  If symptoms change, she has been instructed to contact our office.   03/16 HR 71 BP 118/71 03/18 HR 74 BP 121/68 03/19 HR 80 BP 119/67 03/20 HR 74 BP 131/70 03/21 HR 70 BP 116/62 03/22 HR 72 BP 121/64 03/23 HR 76 BP 128/68   Per [place], nurse visit canceled d/t stable vital signs. Encouraged to continue monitoring her BP and HR and also to start weighing herself daily.    Marlou Sa, RN  10/31/2018 3:56 PM         .

## 2018-11-01 ENCOUNTER — Ambulatory Visit: Payer: Medicare HMO

## 2018-11-18 ENCOUNTER — Telehealth: Payer: Self-pay | Admitting: *Deleted

## 2018-11-18 NOTE — Telephone Encounter (Signed)
Contacted patient for review of chart for virtual visit (telephone) on 11/20/2018 with Dr. Bronson Ing at 8:20 am.         Medications reviewed.  No recent labs or hospital visits.  Patient reminded to have weight and vitals ready before the phone call begins.    The patient verbally consented for a telehealth phone visit with [facility] and understands that his/her insurance company will be billed for the encounter.

## 2018-11-20 ENCOUNTER — Telehealth (INDEPENDENT_AMBULATORY_CARE_PROVIDER_SITE_OTHER): Payer: Medicare HMO | Admitting: Cardiovascular Disease

## 2018-11-20 ENCOUNTER — Encounter: Payer: Self-pay | Admitting: Cardiovascular Disease

## 2018-11-20 VITALS — BP 148/89 | HR 111 | Ht 60.0 in | Wt 256.0 lb

## 2018-11-20 DIAGNOSIS — E785 Hyperlipidemia, unspecified: Secondary | ICD-10-CM | POA: Diagnosis not present

## 2018-11-20 DIAGNOSIS — Z79899 Other long term (current) drug therapy: Secondary | ICD-10-CM

## 2018-11-20 DIAGNOSIS — I5043 Acute on chronic combined systolic (congestive) and diastolic (congestive) heart failure: Secondary | ICD-10-CM

## 2018-11-20 DIAGNOSIS — I130 Hypertensive heart and chronic kidney disease with heart failure and stage 1 through stage 4 chronic kidney disease, or unspecified chronic kidney disease: Secondary | ICD-10-CM | POA: Diagnosis not present

## 2018-11-20 DIAGNOSIS — I25708 Atherosclerosis of coronary artery bypass graft(s), unspecified, with other forms of angina pectoris: Secondary | ICD-10-CM | POA: Diagnosis not present

## 2018-11-20 DIAGNOSIS — I10 Essential (primary) hypertension: Secondary | ICD-10-CM

## 2018-11-20 DIAGNOSIS — N183 Chronic kidney disease, stage 3 unspecified: Secondary | ICD-10-CM

## 2018-11-20 DIAGNOSIS — I4819 Other persistent atrial fibrillation: Secondary | ICD-10-CM | POA: Diagnosis not present

## 2018-11-20 MED ORDER — METOLAZONE 2.5 MG PO TABS: ORAL_TABLET | ORAL | 0 refills | Status: DC

## 2018-11-20 NOTE — Addendum Note (Signed)
Addended by: Laurine Blazer on: 11/20/2018 09:43 AM   Modules accepted: Orders

## 2018-11-20 NOTE — Patient Instructions (Addendum)
Medication Instructions:   Increase Torsemide to 40mg  twice a day x 3 days, then back to 20mg  twice a day.    Metolazone 2.5mg  daily x 3 days only, then stop.  Continue all other medications.    Labwork:  BMET - will go to [place] across from [facility].   Please do this Friday, 11/22/2018.   Testing/Procedures: none  Follow-Up: 1 week telehealth visit with APP  Any Other Special Instructions Will Be Listed Below (If Applicable).  If you need a refill on your cardiac medications before your next appointment, please call your pharmacy.

## 2018-11-20 NOTE — Progress Notes (Signed)
Virtual Visit via Telephone Note   This visit type was conducted due to national recommendations for restrictions regarding the COVID-19 Pandemic (e.g. social distancing) in an effort to limit this patient's exposure and mitigate transmission in our community.  Due to her co-morbid illnesses, this patient is at least at moderate risk for complications without adequate follow up.  This format is felt to be most appropriate for this patient at this time.  The patient did not have access to video technology/had technical difficulties with video requiring transitioning to audio format only (telephone).  All issues noted in this document were discussed and addressed.  No physical exam could be performed with this format.  Please refer to the patient's chart for her  consent to telehealth for [facility].   Evaluation Performed:  Follow-up visit  Date:  11/20/2018   ID:  Erica [name], DOB 26-Sep-1950, MRN [id number]  Patient Location: Home Provider Location: Home  PCP:  Curlene Labrum, MD  Cardiologist:  Kate Sable, MD  Electrophysiologist:  None   Chief Complaint:  DOE  History of Present Illness:    Erica [name] is a 68 y.o. female with past medical history of CAD (s/p CABG in 10/2012), carotid artery stenosis, chronic combined systolic and diastolic CHF, HTN, HLD, IDDM, Stage 3 CKD, and prior CVA.  She says she is not doing as well as she was one month ago at cardiology visit with B. Strader PA-C. She gets short of breath when walking 20 feet from her bedroom to her living room. She denies chest pain. She says while her leg swelling is down from 3/13, "they feel tight and I can't sleep because of it". She checks vitals daily. Sats 86% today.  BP 120-140/42-69, HR's 92-114 bpm. ) O2 sats 86-92%. Wts 252.2-256 lbs. She says there has been no difference with torsemide. Not urinating as much as she was before.  Has not missed any doses of Eliquis. LVEF 25% by echo on 09/30/18  at [facility]. Legs ache. Takes a long time to get out of recliner.  Wt 248 lbs on 10/18/18. Baseline wts 245-248 lbs. BUN 44. Creatinine 1.72 on 09/30/18. 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  The patient does not have symptoms concerning for COVID-19 infection (fever, chills, cough, or new shortness of breath).    Past Medical History:  Diagnosis Date  . Atrial fibrillation ([place])    a. diagnosed in 09/2018  . CAD (coronary artery disease)    a. s/p CABG in 10/2012  . Carcinoid tumor   . Chest pain   . CHF (congestive heart failure) (HCC)    a. EF previously 40%, at 50% by repeat imaging in 03/2018  . Colon cancer ([place]) 2008  . DM (diabetes mellitus) ([place])   . HTN (hypertension)   . MI (myocardial infarction) ([place]) FEB 2014   Acute MI of anterior wall  . Pulmonary edema   . Stroke [facility]) 2010 RIGHT SIDED WEAKNESS   Past Surgical History:  Procedure Laterality Date  . BACTERIAL OVERGROWTH TEST N/A 03/18/2015   Procedure: BACTERIAL OVERGROWTH TEST;  Surgeon: Danie Binder, MD;  Location: [facility];  Service: Endoscopy;  Laterality: N/A;  0800  . CARDIAC CATHETERIZATION  08/2008  . CHOLECYSTECTOMY    . COLONOSCOPY N/A 03/03/2015   SLF: 1. One colon polyp removed 2. Mild diverticulosis in the sigmoid colon 3. Small internal hemorrhoids  . CORONARY ARTERY  BYPASS GRAFT  10/08/2012  . SHOULDER SURGERY    . TUBAL LIGATION       Current Meds  Medication Sig  . cholecalciferol (VITAMIN D) 1000 UNITS tablet Take 1,000 Units by mouth daily.  . colestipol (COLESTID) 1 g tablet TAKE 1 TABLET BY MOUTH 30 MINUTES BEFORE BREAKFAST AND SUPPER  . ELIQUIS 5 MG TABS tablet Take 5 mg by mouth 2 (two) times daily.  . insulin NPH-regular (NOVOLIN 70/30) (70-30) 100 UNIT/ML injection Inject 65 Units into the skin 2 (two) times daily with a meal. 45 units in am / 20 units at night  . lisinopril (PRINIVIL,ZESTRIL) 20 MG tablet Take 20 mg by mouth  daily.  . Magnesium 400 MG CAPS Take 1 capsule by mouth daily.  . metFORMIN (GLUCOPHAGE) 1000 MG tablet Take 1,000 mg by mouth 2 (two) times daily with a meal.  . metoprolol succinate (TOPROL-XL) 50 MG 24 hr tablet Take 1 tablet (50 mg total) by mouth daily. Take with or immediately following a meal.  . Multiple Vitamin (MULTIVITAMIN) tablet Take 1 tablet by mouth daily.  . nitroGLYCERIN (NITROSTAT) 0.4 MG SL tablet Place 1 tablet (0.4 mg total) under the tongue every 5 (five) minutes as needed for chest pain.  Marland Kitchen omeprazole (PRILOSEC) 20 MG capsule TAKE 1 CAPSULE BY MOUTH EVERY DAY  . potassium chloride SA (K-DUR,KLOR-CON) 20 MEQ tablet TAKE 1 TABLET BY MOUTH EVERY DAY  . simvastatin (ZOCOR) 40 MG tablet Take 40 mg by mouth every evening.  Marland Kitchen tiZANidine (ZANAFLEX) 4 MG tablet Take 4 mg by mouth daily.   Marland Kitchen torsemide (DEMADEX) 20 MG tablet Take 1 tablet (20 mg total) by mouth 2 (two) times daily.  . travoprost, benzalkonium, (TRAVATAN) 0.004 % ophthalmic solution Place 1 drop into both eyes at bedtime.     Allergies:   Bee venom and Codeine   Social History   Tobacco Use  . Smoking status: Never Smoker  . Smokeless tobacco: Never Used  Substance Use Topics  . Alcohol use: No    Alcohol/week: 0.0 standard drinks  . Drug use: No     Family Hx: The patient's family history includes Colon cancer in her paternal uncle; Diabetes in her mother; Heart disease in her mother. There is no history of Colon polyps.  ROS:   Please see the history of present illness.     All other systems reviewed and are negative.   Prior CV studies:   The following studies were reviewed today:  Echo reviewed above, EF 25% in Feb 2020  Labs/Other Tests and Data Reviewed:    EKG:  No ECG reviewed.  Recent Labs: No results found for requested labs within last 8760 hours.   Recent Lipid Panel No results found for: CHOL, TRIG, HDL, CHOLHDL, LDLCALC, LDLDIRECT  Wt Readings from Last 3 Encounters:   11/20/18 256 lb (116.1 kg)  10/18/18 248 lb (112.5 kg)  08/02/18 242 lb 6.4 oz (110 kg)     Objective:    Vital Signs:  BP (!) 148/89   Pulse (!) 111   Ht 5' (1.524 m)   Wt 256 lb (116.1 kg)   SpO2 (!) 86%   BMI 50.00 kg/m    Phone visit  ASSESSMENT & PLAN:    1. Acute on chronic combined heart failure: She appears to have NYHA class III symptoms. 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 She is also worried about coronavirus. LVEF  25%. I will increase torsemide to 40 mg bid x 3 days and also add metolazone 2.5 mg daily x 3 days. I will check a BMET in the next 24-48 hrs. Continue Toprol-XL and lisinopril at present doses for now. I would consider transitioning to [facility] in the future. She'll need a repeat echo in the next few months to reassess LVEF.  2. Persistent atrial fibrillation: Given her HF symptoms, I think she would benefit from DCCV once she is euvolemic. HR's not markedly elevated given that she is in decompensated HF. Continue Toprol-XL at present dose. She has not missed any doses of Eliquis.  3. CAD: Denies anginal symptoms. CABG in 10/2012. LVEF 25%. She'll need a repeat echo in a few months and if it remains reduced, I will obtain a Lexiscan Myoview. Continue beta-blocker therapy and statin. She is no longer on ASA given the need for anticoagulation.  4. HTN: BP is elevated. She checks vitals daily. This will need continued monitoring given need for increased diuretic requirement.  5. Hyperlipidemia: Continue statin.  6. CKD stage III: Creatinine 1.72 on 09/30/18. I will repeat BMET in the next 24-48 hrs.   COVID-19 Education: The signs and symptoms of COVID-19 were discussed with the patient and how to seek care for testing (follow up with PCP or arrange E-visit).  The importance of social distancing was discussed today.  Time:   Today, I have spent 40 minutes with the patient with telehealth  technology discussing the above problems.    A high level of decision making was required for increased medical complexities.   Medication Adjustments/Labs and Tests Ordered: Current medicines are reviewed at length with the patient today.  Concerns regarding medicines are outlined above.   Tests Ordered: No orders of the defined types were placed in this encounter.   Medication Changes: No orders of the defined types were placed in this encounter.   Disposition:  Follow up in 1 week(s)  Signed, Kate Sable, MD  11/20/2018 8:31 AM    [facility]

## 2018-11-22 ENCOUNTER — Telehealth: Payer: Self-pay

## 2018-11-22 DIAGNOSIS — I10 Essential (primary) hypertension: Secondary | ICD-10-CM | POA: Diagnosis not present

## 2018-11-22 DIAGNOSIS — I5043 Acute on chronic combined systolic (congestive) and diastolic (congestive) heart failure: Secondary | ICD-10-CM | POA: Diagnosis not present

## 2018-11-22 LAB — BASIC METABOLIC PANEL
BUN/Creatinine Ratio: 19 (calc) (ref 6–22)
BUN: 59 mg/dL — ABNORMAL HIGH (ref 7–25)
CO2: 24 mmol/L (ref 20–32)
Calcium: 9.2 mg/dL (ref 8.6–10.4)
Chloride: 106 mmol/L (ref 98–110)
Creat: 3.1 mg/dL — ABNORMAL HIGH (ref 0.50–0.99)
Glucose, Bld: 54 mg/dL — ABNORMAL LOW (ref 65–99)
Potassium: 4.9 mmol/L (ref 3.5–5.3)
Sodium: 141 mmol/L (ref 135–146)

## 2018-11-22 NOTE — Telephone Encounter (Signed)
Received off fax glucose reading of 54 (was drawn at 0856 hrs today). Known IDDM. Attempted to call patient,lines rings fast busy. Called EC son, Erica [name].Marland KitchenHe is going to check on patient now.Will scan labs and send to [place]

## 2018-11-25 ENCOUNTER — Telehealth: Payer: Self-pay

## 2018-11-25 DIAGNOSIS — Z79899 Other long term (current) drug therapy: Secondary | ICD-10-CM

## 2018-11-25 LAB — BASIC METABOLIC PANEL
BUN/Creatinine Ratio: 19 (calc) (ref 6–22)
BUN: 68 mg/dL — ABNORMAL HIGH (ref 7–25)
CO2: 24 mmol/L (ref 20–32)
Calcium: 9.4 mg/dL (ref 8.6–10.4)
Chloride: 107 mmol/L (ref 98–110)
Creat: 3.52 mg/dL — ABNORMAL HIGH (ref 0.50–0.99)
Glucose, Bld: 68 mg/dL (ref 65–139)
Potassium: 4.9 mmol/L (ref 3.5–5.3)
Sodium: 142 mmol/L (ref 135–146)

## 2018-11-25 MED ORDER — POTASSIUM CHLORIDE CRYS ER 20 MEQ PO TBCR: 20.0000 meq | EXTENDED_RELEASE_TABLET | Freq: Every day | ORAL | 2 refills | Status: DC

## 2018-11-25 MED ORDER — TORSEMIDE 20 MG PO TABS: ORAL_TABLET | ORAL | 2 refills | Status: DC

## 2018-11-25 MED ORDER — METOLAZONE 2.5 MG PO TABS: ORAL_TABLET | ORAL | 0 refills | Status: DC

## 2018-11-25 MED ORDER — LISINOPRIL 20 MG PO TABS: ORAL_TABLET | ORAL | 3 refills | Status: DC

## 2018-11-25 NOTE — Telephone Encounter (Signed)
-----   Message from Herminio Commons, MD sent at 11/24/2018  5:12 PM EDT ----- Hold all diuretics and KCl and repeat BMET asap as renal function has significantly declined secondary to increased diuretic requirement. Ask patient how symptoms are and if weight has gone down.

## 2018-11-25 NOTE — Telephone Encounter (Addendum)
I spoke with patient, she will hold torsemide, potassium and metolazone   She will go to [facility] today for STAT bmet    Patient states her weight has not really changed

## 2018-11-25 NOTE — Telephone Encounter (Signed)
       1630 I spoke with patient, she will now HOLD lisinopril in addition to potassium, torsemide and had only take zaroxolyn for 3 days last week   She will repeat STAT bmet on Thursday, 4/23, I faxed lab slip to [facility]

## 2018-11-25 NOTE — Telephone Encounter (Signed)
-----   Message from Herminio Commons, MD sent at 11/25/2018  3:15 PM EDT ----- Continue to hold all diuretics and KCl and repeat BMET in 3 days. Hold lisinopril as well.

## 2018-11-27 ENCOUNTER — Encounter: Payer: Self-pay | Admitting: Student

## 2018-11-27 ENCOUNTER — Telehealth (INDEPENDENT_AMBULATORY_CARE_PROVIDER_SITE_OTHER): Payer: Medicare HMO | Admitting: Student

## 2018-11-27 VITALS — HR 99 | Wt 256.0 lb

## 2018-11-27 DIAGNOSIS — I10 Essential (primary) hypertension: Secondary | ICD-10-CM

## 2018-11-27 DIAGNOSIS — N183 Chronic kidney disease, stage 3 (moderate): Secondary | ICD-10-CM

## 2018-11-27 DIAGNOSIS — I5043 Acute on chronic combined systolic (congestive) and diastolic (congestive) heart failure: Secondary | ICD-10-CM

## 2018-11-27 DIAGNOSIS — I25708 Atherosclerosis of coronary artery bypass graft(s), unspecified, with other forms of angina pectoris: Secondary | ICD-10-CM

## 2018-11-27 DIAGNOSIS — N179 Acute kidney failure, unspecified: Secondary | ICD-10-CM

## 2018-11-27 DIAGNOSIS — I4819 Other persistent atrial fibrillation: Secondary | ICD-10-CM

## 2018-11-27 DIAGNOSIS — Z7189 Other specified counseling: Secondary | ICD-10-CM

## 2018-11-27 DIAGNOSIS — E785 Hyperlipidemia, unspecified: Secondary | ICD-10-CM

## 2018-11-27 NOTE — Patient Instructions (Signed)
Medication Instructions:  Your physician recommends that you continue on your current medications as directed. Please refer to the Current Medication list given to you today.    Labwork: NONE   Testing/Procedures: NONE  Follow-Up: Your physician recommends that you schedule a follow-up appointment in: 2 Weeks after Discharge    Any Other Special Instructions Will Be Listed Below (If Applicable).  You have been encouraged to be seen in the ED today. If you are unable to go by Friday please call our office.     If you need a refill on your cardiac medications before your next appointment, please call your pharmacy. Thank you for choosing [facility]!

## 2018-11-27 NOTE — Progress Notes (Signed)
Virtual Visit via Telephone Note   This visit type was conducted due to national recommendations for restrictions regarding the COVID-19 Pandemic (e.g. social distancing) in an effort to limit this patient's exposure and mitigate transmission in our community.  Due to her co-morbid illnesses, this patient is at least at moderate risk for complications without adequate follow up.  This format is felt to be most appropriate for this patient at this time.  The patient did not have access to video technology/had technical difficulties with video requiring transitioning to audio format only (telephone).  All issues noted in this document were discussed and addressed.  No physical exam could be performed with this format.  Please refer to the patient's chart for her  consent to telehealth for [facility].   Evaluation Performed:  Follow-up visit  Date:  11/27/2018   ID:  Erica, [name] 29-Mar-1951, MRN [id number]  Patient Location: Home Provider Location: Home  PCP:  Curlene Labrum, MD  Cardiologist:  Kate Sable, MD  Electrophysiologist:  None   Chief Complaint: 1-week Visit  History of Present Illness:    Erica [name] is a 68 y.o. female with past medical history of CAD (s/p CABG in 10/2012), carotid artery stenosis, chronic combined systolic and diastolic CHF (EF previously 40%, 50% by repeat imaging in 03/2018, at 25% by echo in 09/2018), persistent atrial fibrillation (diagnosed in 09/2018, on Eliquis), HTN, HLD, IDDM, Stage 3 CKD, and prior CVA.   Was last evaluated by Dr. Bronson Ing on 11/20/2018 during a Telehealth Visit and reported worsening dyspnea on exertion when ambulating around her home. Denied any chest pain but was experiencing worsening edema and HR had been in the 90's to 110's with saturations in the mid-80's to low-90's. 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 Was still recommended to consider DCCV once volume status improved.  Repeat labs showed her creatinine has acutely worsened to 3.10 and she was informed to hold all diuretics and Lisinopril. Repeat labs on 4/20 showed creatinine was 3.52. Scheduled for repeat labs on 4/23.  In talking with the patient today, she reports no improvement in her symptoms following her last visit. She did not urinate more frequently with Torsemide and Metolazone, saying her weight was unchanged. This has remained at 256 lbs over the past week. She did stop Torsemide, Metolazone, and Lisinopril as instructed at the time of her last BMET.    She continues to get short of breath when walking from room to room in her house. Audibly short of breath on the phone today (told me she had walked from the restroom quickly to get to the phone). Denies any associated chest pain. No specific palpitations, orthopnea, or PND. Has developed worsening lower extremity edema.   The patient does not have fever, chills or cough concerning for COVID-19 infection.   Past Medical History:  Diagnosis Date  . Atrial fibrillation ([place])    a. diagnosed in 09/2018  . CAD (coronary artery disease)    a. s/p CABG in 10/2012  . Carcinoid tumor   . Chest pain   . CHF (congestive heart failure) (HCC)    a. EF previously 40%, at 50% by repeat imaging in 03/2018  . Colon cancer ([place]) 2008  . DM (diabetes mellitus) ([place])   .  HTN (hypertension)   . MI (myocardial infarction) ([place]) FEB 2014   Acute MI of anterior wall  . Pulmonary edema   . Stroke [facility]) 2010 RIGHT SIDED WEAKNESS   Past Surgical History:  Procedure Laterality Date  . BACTERIAL OVERGROWTH TEST N/A 03/18/2015   Procedure: BACTERIAL OVERGROWTH TEST;  Surgeon: Danie Binder, MD;  Location[facility];  Service: Endoscopy;  Laterality: N/A;  0800  . CARDIAC CATHETERIZATION  08/2008  . CHOLECYSTECTOMY     . COLONOSCOPY N/A 03/03/2015   SLF: 1. One colon polyp removed 2. Mild diverticulosis in the sigmoid colon 3. Small internal hemorrhoids  . CORONARY ARTERY BYPASS GRAFT  10/08/2012  . SHOULDER SURGERY    . TUBAL LIGATION       Current Meds  Medication Sig  . cholecalciferol (VITAMIN D) 1000 UNITS tablet Take 1,000 Units by mouth daily.  . colestipol (COLESTID) 1 g tablet TAKE 1 TABLET BY MOUTH 30 MINUTES BEFORE BREAKFAST AND SUPPER  . ELIQUIS 5 MG TABS tablet Take 5 mg by mouth 2 (two) times daily.  . insulin NPH-regular (NOVOLIN 70/30) (70-30) 100 UNIT/ML injection Inject 65 Units into the skin 2 (two) times daily with a meal. 45 units in am / 20 units at night  . Magnesium 400 MG CAPS Take 1 capsule by mouth daily.  . metFORMIN (GLUCOPHAGE) 1000 MG tablet Take 1,000 mg by mouth 2 (two) times daily with a meal.  . metoprolol succinate (TOPROL-XL) 50 MG 24 hr tablet Take 1 tablet (50 mg total) by mouth daily. Take with or immediately following a meal.  . Multiple Vitamin (MULTIVITAMIN) tablet Take 1 tablet by mouth daily.  . nitroGLYCERIN (NITROSTAT) 0.4 MG SL tablet Place 1 tablet (0.4 mg total) under the tongue every 5 (five) minutes as needed for chest pain.  Marland Kitchen omeprazole (PRILOSEC) 20 MG capsule TAKE 1 CAPSULE BY MOUTH EVERY DAY  . simvastatin (ZOCOR) 40 MG tablet Take 40 mg by mouth every evening.  Marland Kitchen tiZANidine (ZANAFLEX) 4 MG tablet Take 4 mg by mouth daily.   . travoprost, benzalkonium, (TRAVATAN) 0.004 % ophthalmic solution Place 1 drop into both eyes at bedtime.     Allergies:   Bee venom and Codeine   Social History   Tobacco Use  . Smoking status: Never Smoker  . Smokeless tobacco: Never Used  Substance Use Topics  . Alcohol use: No    Alcohol/week: 0.0 standard drinks  . Drug use: No     Family Hx: The patient's family history includes Colon cancer in her paternal uncle; Diabetes in her mother; Heart disease in her mother. There is no history of Colon polyps.  ROS:    Please see the history of present illness.     All other systems reviewed and are negative.   Prior CV studies:   The following studies were reviewed today:  Echocardiogram: 09/2018   Labs/Other Tests and Data Reviewed:    EKG:  An ECG dated 10/07/2018 was personally reviewed today and demonstrated:  atrial fibrillation, HR 91.   Recent Labs: 11/25/2018: BUN 68; Creat 3.52; Potassium 4.9; Sodium 142   Recent Lipid Panel No results found for: CHOL, TRIG, HDL, CHOLHDL, LDLCALC, LDLDIRECT  Wt Readings from Last 3 Encounters:  11/27/18 256 lb (116.1 kg)  11/20/18 256 lb (116.1 kg)  10/18/18 248 lb (112.5 kg)     Objective:    Vital Signs:  Pulse 99   Wt 256 lb (116.1 kg)   BMI 50.00 kg/m  General: Pleasant, female sounding in NAD Psych: Normal affect. Neuro: Alert and oriented X 3. Moves all extremities spontaneously.  Lungs:  Respirations sound labored while talking on the phone.    ASSESSMENT & PLAN:    1. Acute on Chronic Combined Systolic and Diastolic CHF - recent echo showed her EF was reduced to 25%, possibly tachycardia-mediated given her concurrent persistent atrial fibrillation.  - she continues to experience progressive dyspnea on exertion and did not experience improvement in her symptoms or a decrease in her weight when Torsemide was increased from 20mg  BID to 40mg  BID and she was started on Metolazone. Creatinine has trended upwards from 1.7 to 3.52 by most recent check.  - given her complex situation and progressive symptoms, I recommended she proceed to the ED for evaluation today. She declined as she did last week during her visit with Dr. 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 I advised her she would need to be admitted for several days for diuresis and possible DCCV pending improvement in her symptoms. Patient aware that no visitors will be allowed given current  COVID-19 situation.    2. Persistent Atrial Fibrillation - diagnosed in 09/2018. She denies any recent palpitations but would need to follow HR closely during admission as this has been in the 90's to low-100's when checked at home. Continue Toprol-XL. Would recommend DCCV during admission once volume status improves as she has not missed any doses of Eliquis.  - continue Eliquis 5mg  BID for anticoagulation.   3. CAD - she is s/p CABG in 10/2012. Has been experiencing progressive dyspnea on exertion as outlined above but denies any recent chest pain.  - continue BB and statin therapy. Not on ASA given the need for anticoagulation.   4. HTN - had overall been well-controlled when checked at home but she has not rechecked this since discontinuing Lisinopril. Continue Toprol-XL. May need to consider the addition of Amlodipine or Hydralazine.   5. HLD - followed by PCP. Remains on Simvastatin 40mg  daily. If not at goal of LDL < 70, would recommend switching to Crestor 20mg  daily.   6. Acute on Chronic Stage 3 CKD - baseline creatinine 1.6 - 1.7. Elevated to 3.52 when checked on 4/20. Follow with daily BMET during admission. Will need input from Nephrology if this does not improve (not followed by a Nephrologist as an outpatient). Would hold Metformin at the time of admission as well and continue Novolin with SSI.   7. COVID-19 Education The signs and symptoms of COVID-19 were discussed with the patient. The importance of social distancing was discussed today.  Time:   Today, I have spent 21 minutes with the patient with telehealth technology discussing the above problems.     Medication Adjustments/Labs and Tests Ordered: Current medicines are reviewed at length with the patient today.  Concerns regarding medicines are outlined above.   Tests Ordered: No orders of the defined types were placed in this encounter.   Medication Changes: No orders of the defined types were placed in this  encounter.   Disposition: Recommended the patient proceed to the Emergency Department for likely admission in the setting of her acute CHF exacerbation and AKI.   Signed, Erma Heritage, PA-C  11/27/2018 7:15 PM    [facility]

## 2018-11-29 ENCOUNTER — Encounter (HOSPITAL_COMMUNITY): Payer: Self-pay | Admitting: Emergency Medicine

## 2018-11-29 ENCOUNTER — Emergency Department (HOSPITAL_COMMUNITY): Payer: Medicare HMO

## 2018-11-29 ENCOUNTER — Other Ambulatory Visit: Payer: Self-pay

## 2018-11-29 ENCOUNTER — Inpatient Hospital Stay (HOSPITAL_COMMUNITY)
Admission: EM | Admit: 2018-11-29 | Discharge: 2018-12-05 | DRG: 291 | Disposition: A | Discharge status: Home Health | Payer: Medicare HMO | Attending: Internal Medicine | Admitting: Internal Medicine

## 2018-11-29 DIAGNOSIS — Z800 Family history of malignant neoplasm of digestive organs: Secondary | ICD-10-CM | POA: Diagnosis not present

## 2018-11-29 DIAGNOSIS — Z8249 Family history of ischemic heart disease and other diseases of the circulatory system: Secondary | ICD-10-CM | POA: Diagnosis not present

## 2018-11-29 DIAGNOSIS — Z91030 Bee allergy status: Secondary | ICD-10-CM | POA: Diagnosis not present

## 2018-11-29 DIAGNOSIS — I130 Hypertensive heart and chronic kidney disease with heart failure and stage 1 through stage 4 chronic kidney disease, or unspecified chronic kidney disease: Principal | ICD-10-CM | POA: Diagnosis present

## 2018-11-29 DIAGNOSIS — N179 Acute kidney failure, unspecified: Secondary | ICD-10-CM

## 2018-11-29 DIAGNOSIS — Z833 Family history of diabetes mellitus: Secondary | ICD-10-CM

## 2018-11-29 DIAGNOSIS — I255 Ischemic cardiomyopathy: Secondary | ICD-10-CM | POA: Diagnosis present

## 2018-11-29 DIAGNOSIS — I10 Essential (primary) hypertension: Secondary | ICD-10-CM

## 2018-11-29 DIAGNOSIS — Z951 Presence of aortocoronary bypass graft: Secondary | ICD-10-CM

## 2018-11-29 DIAGNOSIS — Z794 Long term (current) use of insulin: Secondary | ICD-10-CM | POA: Diagnosis not present

## 2018-11-29 DIAGNOSIS — K219 Gastro-esophageal reflux disease without esophagitis: Secondary | ICD-10-CM | POA: Diagnosis present

## 2018-11-29 DIAGNOSIS — R0902 Hypoxemia: Secondary | ICD-10-CM | POA: Diagnosis present

## 2018-11-29 DIAGNOSIS — E785 Hyperlipidemia, unspecified: Secondary | ICD-10-CM | POA: Diagnosis present

## 2018-11-29 DIAGNOSIS — I4819 Other persistent atrial fibrillation: Secondary | ICD-10-CM

## 2018-11-29 DIAGNOSIS — D631 Anemia in chronic kidney disease: Secondary | ICD-10-CM | POA: Diagnosis present

## 2018-11-29 DIAGNOSIS — Z885 Allergy status to narcotic agent status: Secondary | ICD-10-CM

## 2018-11-29 DIAGNOSIS — I69354 Hemiplegia and hemiparesis following cerebral infarction affecting left non-dominant side: Secondary | ICD-10-CM

## 2018-11-29 DIAGNOSIS — I493 Ventricular premature depolarization: Secondary | ICD-10-CM | POA: Diagnosis not present

## 2018-11-29 DIAGNOSIS — J9601 Acute respiratory failure with hypoxia: Secondary | ICD-10-CM | POA: Diagnosis present

## 2018-11-29 DIAGNOSIS — I25708 Atherosclerosis of coronary artery bypass graft(s), unspecified, with other forms of angina pectoris: Secondary | ICD-10-CM

## 2018-11-29 DIAGNOSIS — Z7901 Long term (current) use of anticoagulants: Secondary | ICD-10-CM

## 2018-11-29 DIAGNOSIS — I639 Cerebral infarction, unspecified: Secondary | ICD-10-CM | POA: Diagnosis not present

## 2018-11-29 DIAGNOSIS — D649 Anemia, unspecified: Secondary | ICD-10-CM

## 2018-11-29 DIAGNOSIS — Z6841 Body Mass Index (BMI) 40.0 and over, adult: Secondary | ICD-10-CM | POA: Diagnosis not present

## 2018-11-29 DIAGNOSIS — N183 Chronic kidney disease, stage 3 unspecified: Secondary | ICD-10-CM

## 2018-11-29 DIAGNOSIS — I5021 Acute systolic (congestive) heart failure: Secondary | ICD-10-CM | POA: Diagnosis not present

## 2018-11-29 DIAGNOSIS — E1122 Type 2 diabetes mellitus with diabetic chronic kidney disease: Secondary | ICD-10-CM | POA: Diagnosis present

## 2018-11-29 DIAGNOSIS — Z85038 Personal history of other malignant neoplasm of large intestine: Secondary | ICD-10-CM | POA: Diagnosis not present

## 2018-11-29 DIAGNOSIS — I4891 Unspecified atrial fibrillation: Secondary | ICD-10-CM | POA: Diagnosis not present

## 2018-11-29 DIAGNOSIS — I110 Hypertensive heart disease with heart failure: Secondary | ICD-10-CM | POA: Diagnosis not present

## 2018-11-29 DIAGNOSIS — I2510 Atherosclerotic heart disease of native coronary artery without angina pectoris: Secondary | ICD-10-CM | POA: Diagnosis present

## 2018-11-29 DIAGNOSIS — I5043 Acute on chronic combined systolic (congestive) and diastolic (congestive) heart failure: Secondary | ICD-10-CM | POA: Diagnosis present

## 2018-11-29 DIAGNOSIS — E1121 Type 2 diabetes mellitus with diabetic nephropathy: Secondary | ICD-10-CM | POA: Diagnosis not present

## 2018-11-29 DIAGNOSIS — I252 Old myocardial infarction: Secondary | ICD-10-CM

## 2018-11-29 DIAGNOSIS — R0602 Shortness of breath: Secondary | ICD-10-CM | POA: Diagnosis not present

## 2018-11-29 DIAGNOSIS — I5023 Acute on chronic systolic (congestive) heart failure: Secondary | ICD-10-CM | POA: Diagnosis not present

## 2018-11-29 LAB — BASIC METABOLIC PANEL
Anion gap: 13 (ref 5–15)
BUN: 63 mg/dL — ABNORMAL HIGH (ref 8–23)
CO2: 19 mmol/L — ABNORMAL LOW (ref 22–32)
Calcium: 9.1 mg/dL (ref 8.9–10.3)
Chloride: 107 mmol/L (ref 98–111)
Creatinine, Ser: 2.67 mg/dL — ABNORMAL HIGH (ref 0.44–1.00)
GFR calc Af Amer: 21 mL/min — ABNORMAL LOW (ref >60)
GFR calc non Af Amer: 18 mL/min — ABNORMAL LOW (ref >60)
Glucose, Bld: 112 mg/dL — ABNORMAL HIGH (ref 70–99)
Potassium: 4.5 mmol/L (ref 3.5–5.1)
Sodium: 139 mmol/L (ref 135–145)

## 2018-11-29 LAB — CBC
HCT: 32.4 % — ABNORMAL LOW (ref 36.0–46.0)
Hemoglobin: 9.4 g/dL — ABNORMAL LOW (ref 12.0–15.0)
MCH: 22.6 pg — ABNORMAL LOW (ref 26.0–34.0)
MCHC: 29 g/dL — ABNORMAL LOW (ref 30.0–36.0)
MCV: 77.9 fL — ABNORMAL LOW (ref 80.0–100.0)
Platelets: 177 10*3/uL (ref 150–400)
RBC: 4.16 MIL/uL (ref 3.87–5.11)
RDW: 18.5 % — ABNORMAL HIGH (ref 11.5–15.5)
WBC: 6 10*3/uL (ref 4.0–10.5)
nRBC: 0 % (ref 0.0–0.2)

## 2018-11-29 LAB — GLUCOSE, CAPILLARY
Glucose-Capillary: 36 mg/dL — CL (ref 70–99)
Glucose-Capillary: 43 mg/dL — CL (ref 70–99)
Glucose-Capillary: 85 mg/dL (ref 70–99)
Glucose-Capillary: 108 mg/dL — ABNORMAL HIGH (ref 70–99)

## 2018-11-29 LAB — BRAIN NATRIURETIC PEPTIDE: B Natriuretic Peptide: 1482 pg/mL — ABNORMAL HIGH (ref 0.0–100.0)

## 2018-11-29 LAB — MAGNESIUM: Magnesium: 2.1 mg/dL (ref 1.7–2.4)

## 2018-11-29 LAB — POC OCCULT BLOOD, ED: Fecal Occult Bld: NEGATIVE

## 2018-11-29 MED ORDER — FUROSEMIDE 10 MG/ML IJ SOLN
40.0000 mg | Freq: Two times a day (BID) | INTRAMUSCULAR | Status: DC
  Administered 2018-11-29 – 2018-11-30 (×2): 40 mg via INTRAVENOUS
  Filled 2018-11-29 (×2): qty 4

## 2018-11-29 MED ORDER — FUROSEMIDE 10 MG/ML IJ SOLN
40.0000 mg | Freq: Once | INTRAMUSCULAR | Status: AC
  Administered 2018-11-29: 40 mg via INTRAVENOUS
  Filled 2018-11-29: qty 4

## 2018-11-29 MED ORDER — LATANOPROST 0.005 % OP SOLN
1.0000 [drp] | Freq: Every day | OPHTHALMIC | Status: DC
  Administered 2018-11-29 – 2018-12-04 (×6): 1 [drp] via OPHTHALMIC
  Filled 2018-11-29 (×2): qty 2.5

## 2018-11-29 MED ORDER — INSULIN ASPART 100 UNIT/ML ~~LOC~~ SOLN
0.0000 [IU] | Freq: Three times a day (TID) | SUBCUTANEOUS | Status: DC
  Administered 2018-11-30: 12:00:00 1 [IU] via SUBCUTANEOUS
  Administered 2018-11-30: 2 [IU] via SUBCUTANEOUS
  Administered 2018-12-01: 3 [IU] via SUBCUTANEOUS
  Administered 2018-12-01 – 2018-12-03 (×6): 2 [IU] via SUBCUTANEOUS
  Administered 2018-12-03: 1 [IU] via SUBCUTANEOUS
  Administered 2018-12-04: 2 [IU] via SUBCUTANEOUS
  Administered 2018-12-04: 3 [IU] via SUBCUTANEOUS
  Administered 2018-12-04 – 2018-12-05 (×2): 2 [IU] via SUBCUTANEOUS
  Administered 2018-12-05: 12:00:00 3 [IU] via SUBCUTANEOUS

## 2018-11-29 MED ORDER — INSULIN ASPART PROT & ASPART (70-30 MIX) 100 UNIT/ML ~~LOC~~ SUSP
8.0000 [IU] | Freq: Two times a day (BID) | SUBCUTANEOUS | Status: DC
  Filled 2018-11-29: qty 10

## 2018-11-29 MED ORDER — PANTOPRAZOLE SODIUM 40 MG PO TBEC
40.0000 mg | DELAYED_RELEASE_TABLET | Freq: Every day | ORAL | Status: DC
  Administered 2018-11-29 – 2018-12-05 (×7): 40 mg via ORAL
  Filled 2018-11-29 (×7): qty 1

## 2018-11-29 MED ORDER — ONDANSETRON HCL 4 MG PO TABS: 4.0000 mg | ORAL_TABLET | Freq: Four times a day (QID) | ORAL | Status: DC | PRN

## 2018-11-29 MED ORDER — INSULIN ASPART 100 UNIT/ML ~~LOC~~ SOLN
0.0000 [IU] | Freq: Every day | SUBCUTANEOUS | Status: DC
  Administered 2018-11-30: 2 [IU] via SUBCUTANEOUS

## 2018-11-29 MED ORDER — APIXABAN 5 MG PO TABS
5.0000 mg | ORAL_TABLET | Freq: Two times a day (BID) | ORAL | Status: DC
  Administered 2018-11-29 – 2018-12-05 (×12): 5 mg via ORAL
  Filled 2018-11-29 (×12): qty 1

## 2018-11-29 MED ORDER — TRAVOPROST 0.004 % OP SOLN: 1.0000 [drp] | Freq: Every day | OPHTHALMIC | Status: DC

## 2018-11-29 MED ORDER — NITROGLYCERIN 0.4 MG SL SUBL: 0.4000 mg | SUBLINGUAL_TABLET | SUBLINGUAL | Status: DC | PRN

## 2018-11-29 MED ORDER — ADULT MULTIVITAMIN W/MINERALS CH
1.0000 | ORAL_TABLET | Freq: Every day | ORAL | Status: DC
  Administered 2018-11-30 – 2018-12-05 (×6): 1 via ORAL
  Filled 2018-11-29 (×6): qty 1

## 2018-11-29 MED ORDER — SODIUM CHLORIDE 0.9% FLUSH
3.0000 mL | Freq: Two times a day (BID) | INTRAVENOUS | Status: DC
  Administered 2018-11-29 – 2018-12-05 (×9): 3 mL via INTRAVENOUS

## 2018-11-29 MED ORDER — TIZANIDINE HCL 4 MG PO TABS
4.0000 mg | ORAL_TABLET | Freq: Every day | ORAL | Status: DC
  Administered 2018-11-29 – 2018-12-04 (×6): 4 mg via ORAL
  Filled 2018-11-29 (×6): qty 1

## 2018-11-29 MED ORDER — METOPROLOL SUCCINATE ER 50 MG PO TB24
50.0000 mg | ORAL_TABLET | Freq: Every day | ORAL | Status: DC
  Administered 2018-11-30 – 2018-12-01 (×2): 50 mg via ORAL
  Filled 2018-11-29 (×2): qty 1

## 2018-11-29 MED ORDER — SODIUM CHLORIDE 0.9% FLUSH: 3.0000 mL | INTRAVENOUS | Status: DC | PRN

## 2018-11-29 MED ORDER — ONDANSETRON HCL 4 MG/2ML IJ SOLN: 4.0000 mg | Freq: Four times a day (QID) | INTRAMUSCULAR | Status: DC | PRN

## 2018-11-29 MED ORDER — SIMVASTATIN 20 MG PO TABS
40.0000 mg | ORAL_TABLET | Freq: Every evening | ORAL | Status: DC
  Administered 2018-11-29 – 2018-12-03 (×5): 40 mg via ORAL
  Filled 2018-11-29 (×5): qty 2

## 2018-11-29 MED ORDER — MAGNESIUM OXIDE 400 (241.3 MG) MG PO TABS
400.0000 mg | ORAL_TABLET | Freq: Every day | ORAL | Status: DC
  Administered 2018-11-30 – 2018-12-05 (×6): 400 mg via ORAL
  Filled 2018-11-29 (×6): qty 1

## 2018-11-29 MED ORDER — COLESTIPOL HCL 1 G PO TABS
1.0000 g | ORAL_TABLET | Freq: Every day | ORAL | Status: DC
  Administered 2018-11-30 – 2018-12-05 (×5): 1 g via ORAL
  Filled 2018-11-29 (×7): qty 1

## 2018-11-29 MED ORDER — ACETAMINOPHEN 650 MG RE SUPP: 650.0000 mg | Freq: Four times a day (QID) | RECTAL | Status: DC | PRN

## 2018-11-29 MED ORDER — SODIUM CHLORIDE 0.9 % IV SOLN: 250.0000 mL | INTRAVENOUS | Status: DC | PRN

## 2018-11-29 MED ORDER — ACETAMINOPHEN 325 MG PO TABS: 650.0000 mg | ORAL_TABLET | Freq: Four times a day (QID) | ORAL | Status: DC | PRN

## 2018-11-29 MED ORDER — VITAMIN D 25 MCG (1000 UNIT) PO TABS
1000.0000 [IU] | ORAL_TABLET | Freq: Every day | ORAL | Status: DC
  Administered 2018-11-30 – 2018-12-05 (×6): 1000 [IU] via ORAL
  Filled 2018-11-29 (×5): qty 1

## 2018-11-29 NOTE — ED Triage Notes (Signed)
Pt states heartcare told her to come to ED to be admitted due to CHF.  Pt sob at this time.

## 2018-11-29 NOTE — ED Provider Notes (Signed)
[facility] EMERGENCY DEPARTMENT Provider Note   CSN: [id number] Arrival date & time: 11/29/18  0845    History   Chief Complaint Chief Complaint  Patient presents with   Shortness of Breath    HPI Erica [name] is a 68 y.o. female.     Patient with atrial fibrillation history currently on Eliquis, follows with local cardiologist, heart failure presents with worsening shortness of breath and concern for kidney function changes.  Patient has been treated with increased Lasix she believes 40 mg twice daily for the past week.  Patient still has persistent leg edema, shortness of breath with walking from one room to another and orthopnea.  Patient was sent in for further assessment and admission to the hospital.  Patient unsure specific weight changes.     Past Medical History:  Diagnosis Date   Atrial fibrillation [facility])    a. diagnosed in 09/2018   CAD (coronary artery disease)    a. s/p CABG in 10/2012   Carcinoid tumor    Chest pain    CHF (congestive heart failure) ([place])    a. EF previously 40%, at 50% by repeat imaging in 03/2018   Colon cancer ([place]) 2008   DM (diabetes mellitus) ([place])    HTN (hypertension)    MI (myocardial infarction) ([place]) FEB 2014   Acute MI of anterior wall   Pulmonary edema    Stroke ([place]) 2010 RIGHT SIDED WEAKNESS    Patient Active Problem List   Diagnosis Date Noted   Acute on chronic systolic (congestive) heart failure ([place]) 11/29/2018   Insulin dependent diabetes mellitus ([place]) 08/02/2018   Morbid obesity ([place]) 08/02/2018   GERD (gastroesophageal reflux disease) 04/14/2015   Diarrhea 12/10/2013   Carcinoid tumor    CVA (cerebral vascular accident) ([place]) 03/07/2013   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 One colon polyp removed 2. Mild diverticulosis in the sigmoid colon 3. Small internal hemorrhoids   CORONARY ARTERY BYPASS GRAFT  10/08/2012   SHOULDER SURGERY     TUBAL LIGATION       OB History   No obstetric history on file.      Home Medications    Prior to Admission medications   Medication Sig Start Date End Date Taking? Authorizing Provider  cholecalciferol (VITAMIN D) 1000 UNITS tablet Take 1,000 Units by mouth daily.    [provider]  colestipol (COLESTID) 1 g tablet TAKE 1 TABLET BY MOUTH 30 MINUTES BEFORE BREAKFAST AND SUPPER 05/16/16   Gordy Levan, Eric A, NP  ELIQUIS 5 MG TABS tablet Take 5 mg by mouth 2 (two) times daily. 10/07/18   [provider]  insulin NPH-regular (NOVOLIN 70/30) (70-30) 100 UNIT/ML injection Inject 65 Units into the skin 2 (two) times daily with a meal. 45 units in am / 20 units at night    [provider]  Magnesium 400 MG CAPS Take 1 capsule by mouth daily. 11/12/13   Herminio Commons, MD  metFORMIN (GLUCOPHAGE) 1000  MG tablet Take 1,000 mg by mouth 2 (two) times daily with a meal.    [provider]  metoprolol succinate (TOPROL-XL) 50 MG 24 hr tablet Take 1 tablet (50 mg total) by mouth daily. Take with or immediately following a meal. 10/18/18 01/16/19  Strader, Fransisco Hertz, PA-C  Multiple Vitamin (MULTIVITAMIN) tablet Take 1 tablet by mouth daily.    [provider]  nitroGLYCERIN (NITROSTAT) 0.4 MG SL tablet Place 1 tablet (0.4 mg total) under the tongue every 5 (five) minutes as needed for chest pain. 06/05/17   Herminio Commons, MD  omeprazole (PRILOSEC) 20 MG capsule TAKE 1 CAPSULE BY MOUTH EVERY DAY 04/17/16   Mahala Menghini,  PA-C  simvastatin (ZOCOR) 40 MG tablet Take 40 mg by mouth every evening.    [provider]  tiZANidine (ZANAFLEX) 4 MG tablet Take 4 mg by mouth daily.     [provider]  travoprost, benzalkonium, (TRAVATAN) 0.004 % ophthalmic solution Place 1 drop into both eyes at bedtime.    [provider]    Family History Family History  Problem Relation Age of Onset   Diabetes Mother    Heart disease Mother    Colon cancer Paternal Uncle    Colon polyps Neg Hx     Social History Social History   Tobacco Use   Smoking status: Never Smoker   Smokeless tobacco: Never Used  Substance Use Topics   Alcohol use: No    Alcohol/week: 0.0 standard drinks   Drug use: No     Allergies   Bee venom and Codeine   Review of Systems Review of Systems  Constitutional: Negative for chills and fever.  HENT: Negative for congestion.   Eyes: Negative for visual disturbance.  Respiratory: Positive for cough and shortness of breath.   Cardiovascular: Positive for leg swelling. Negative for chest pain.  Gastrointestinal: Negative for abdominal pain and vomiting.  Genitourinary: Negative for dysuria and flank pain.  Musculoskeletal: Negative for back pain, neck pain and neck stiffness.  Skin: Negative for rash.  Neurological: Negative for light-headedness and headaches.     Physical Exam Updated Vital Signs BP 117/66    Pulse 99    Temp 98.3 F (36.8 C)    Resp (!) 25    Ht 5' (1.524 m)    Wt 116.1 kg    SpO2 99%    BMI 50.00 kg/m   Physical Exam Vitals signs and nursing note reviewed.  Constitutional:      Appearance: She is well-developed.  HENT:     Head: Normocephalic and atraumatic.  Eyes:     General:        Right eye: No discharge.        Left eye: No discharge.     Conjunctiva/sclera: Conjunctivae normal.  Neck:     Musculoskeletal: Normal range of motion and neck supple.     Trachea: No tracheal deviation.  Cardiovascular:     Rate and  Rhythm: Normal rate. Rhythm irregular.  Pulmonary:     Effort: Pulmonary effort is normal. Tachypnea present.     Breath sounds: Examination of the right-lower field reveals decreased breath sounds. Examination of the left-lower field reveals decreased breath sounds. Decreased breath sounds present.  Abdominal:     General: There is no distension.     Palpations: Abdomen is soft.     Tenderness: There is no abdominal tenderness. There is no guarding.  Musculoskeletal:     Right lower  leg: She exhibits tenderness. Edema present.     Left lower leg: She exhibits tenderness. Edema present.  Skin:    General: Skin is warm.     Findings: No rash.  Neurological:     Mental Status: She is alert and oriented to person, place, and time.      ED Treatments / Results  Labs (all labs ordered are listed, but only abnormal results are displayed) Labs Reviewed  BASIC METABOLIC PANEL - Abnormal; Notable for the following components:      Result Value   CO2 19 (*)    Glucose, Bld 112 (*)    BUN 63 (*)    Creatinine, Ser 2.67 (*)    GFR calc non Af Amer 18 (*)    GFR calc Af Amer 21 (*)    All other components within normal limits  BRAIN NATRIURETIC PEPTIDE - Abnormal; Notable for the following components:   B Natriuretic Peptide 1,482.0 (*)    All other components within normal limits  CBC - Abnormal; Notable for the following components:   Hemoglobin 9.4 (*)    HCT 32.4 (*)    MCV 77.9 (*)    MCH 22.6 (*)    MCHC 29.0 (*)    RDW 18.5 (*)    All other components within normal limits  MAGNESIUM  POC OCCULT BLOOD, ED    EKG None  Radiology Dg Chest Portable 1 View  Result Date: 11/29/2018 CLINICAL DATA:  Increasing shortness of breath EXAM: PORTABLE CHEST 1 VIEW COMPARISON:  09/28/2018 FINDINGS: Cardiac shadow remains enlarged. Postsurgical changes are noted. Poor inspiratory effort is noted. No focal infiltrate or sizable effusion is seen. No bony abnormality is noted. IMPRESSION:  Poor inspiratory effort without focal abnormality. Electronically Signed   By: Inez Catalina M.D.   On: 11/29/2018 09:37    Procedures Procedures (including critical care time)  Medications Ordered in ED Medications  furosemide (LASIX) injection 40 mg (has no administration in time range)     Initial Impression / Assessment and Plan / ED Course  I have reviewed the triage vital signs and the nursing notes.  Pertinent labs & imaging results that were available during my care of the patient were reviewed by me and considered in my medical decision making (see chart for details).       Patient presents with clinical concern for acute CHF exacerbation.  Patient also has component of chronic renal failure which is likely playing a role to her edema.  Patient required 1 to 2 L nasal cannula.  Portable chest today poor inspiratory effort, no focal findings.  Blood work reviewed significant elevated BNP 1400, bicarb 19 likely combination of renal and heart failure, anemia 9, no active bleeding.  Discussed with hospitalist for admission.  IV Lasix ordered.  No fever in the ED.  HAISLEY ARENS was evaluated in Emergency Department on 11/29/2018 for the symptoms described in the history of present illness. She was evaluated in the context of the global COVID-19 pandemic, which necessitated consideration that the patient might be at risk for infection with the SARS-CoV-2 virus that causes COVID-19. Institutional protocols and algorithms that pertain to the evaluation of patients at risk for COVID-19 are in a state of rapid change based on information released by regulatory bodies including the CDC and federal and state organizations. These policies and algorithms were followed during the patient's care in the ED. The patients results and plan were reviewed and discussed.   Any x-rays  performed were independently reviewed by myself.   Differential diagnosis were considered with the presenting  HPI.  Medications  furosemide (LASIX) injection 40 mg (40 mg Intravenous Given 11/29/18 1041)    Vitals:   11/29/18 0855 11/29/18 0857 11/29/18 0900 11/29/18 0915  BP: (!) 148/89  117/66   Pulse: 91  98 99  Resp:    (!) 25  Temp: 98.3 F (36.8 C)     SpO2: 92%  100% 99%  Weight:  116.1 kg    Height:  5' (1.524 m)      Final diagnoses:  Acute systolic congestive heart failure (HCC)  Hypoxia  Acute renal failure, unspecified acute renal failure type (HCC)   CHA2DS2/VAS Stroke Risk Points  Current as of 14 minutes ago     8 >= 2 Points: High Risk  1 - 1.99 Points: Medium Risk  0 Points: Low Risk    The patient's score has not changed in the past year.:  No Change     Details    This score determines the patient's risk of having a stroke if the  patient has atrial fibrillation.       Points Metrics  1 Has Congestive Heart Failure:  Yes    Current as of 14 minutes ago  1 Has Vascular Disease:  Yes    Current as of 14 minutes ago  1 Has Hypertension:  Yes    Current as of 14 minutes ago  1 Age:  60    Current as of 14 minutes ago  1 Has Diabetes:  Yes    Current as of 14 minutes ago  2 Had Stroke:  Yes  Had TIA:  No  Had thromboembolism:  No    Current as of 14 minutes ago  1 Female:  Yes    Current as of 14 minutes ago     Pt on anticoagulation at home.       Admission/ observation were discussed with the admitting physician, patient and/or family and they are comfortable with the plan.    Final Clinical Impressions(s) / ED Diagnoses   Final diagnoses:  Acute systolic congestive heart failure ([place])  Hypoxia  Acute renal failure, unspecified acute renal failure type [facility])    ED Discharge Orders    None       Erica Morrison, MD 11/29/18 1046

## 2018-11-29 NOTE — Consult Note (Signed)
Cardiology Consult    Patient ID: Erica [name]; [id number]; 1951/05/16   Admit date: 11/29/2018 Date of Consult: 11/29/2018  Primary Care Provider: Curlene Labrum, MD Primary Cardiologist: Kate Sable, MD   Patient Profile    Erica [name] is a 68 y.o. female with past medical history of CAD (s/p CABG in 10/2012), carotid artery stenosis, chronic combined systolic and diastolic CHF (EF previously 40%, 50% by repeat imaging in 03/2018, at 25% by echo in 09/2018), persistent atrial fibrillation (diagnosed in 09/2018, on Eliquis), HTN, HLD, IDDM,Stage 3 CKD,and prior CVA who is being seen today for the evaluation of CHF at the request of Dr. Manuella Ghazi.   History of Present Illness    Erica [name] has been followed closely since her recent diagnosis of atrial fibrillation and worsening EF and was overall doing well but started to experience progressive symptoms a few weeks ago. Her Torsemide was initially titrated from 20mg  BID to 40mg  BID on 4/15 with Metolazone 2.5mg  daily added but her creatinine acutely worsened, going from a baseline of 1.7 up to 3.10 then 3.52. Lisinopril was discontinue and she was instructed to hold diuretics until further instructed.   During her Telemedicine visit on 4/22, she was still experiencing progressive dyspnea on exertion with minimal activity such as walking from room to room. Weight had been at 256 lbs and she reported having not experienced much urination when talking additional Torsemide and Metolazone. 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   She has continued to experience progressive dyspnea on exertion and lower extremity edema. Denies any chest pain or palpitations. No recent fever, chills, or sick contacts.   Initial labs show WBC 6.0, Hgb 9.4, platelets 177, Na+ 139, K+ 4.5, and creatinine  2.67. BNP 1482. Mg 2.1. CXR shows no acute findings. EKG shows atrial fibrillation, HR 99, with nonspecific IVCD and LVH.   She is scheduled to receive IV Lasix 40mg  but this has not yet been administered.    Past Medical History:  Diagnosis Date   Atrial fibrillation ([place])    a. diagnosed in 09/2018   CAD (coronary artery disease)    a. s/p CABG in 10/2012   Carcinoid tumor    Chest pain    CHF (congestive heart failure) ([place])    a. EF previously 40%, at 50% by repeat imaging in 03/2018   Colon cancer ([place]) 2008   DM (diabetes mellitus) ([place])    HTN (hypertension)    MI (myocardial infarction) ([place]) FEB 2014   Acute MI of anterior wall   Pulmonary edema    Stroke ([place]) 2010 RIGHT SIDED WEAKNESS    Past Surgical History:  Procedure Laterality Date   BACTERIAL OVERGROWTH TEST N/A 03/18/2015   Procedure: BACTERIAL OVERGROWTH TEST;  Surgeon: Danie Binder, MD;  Location: [facility] ENDO SUITE;  Service: Endoscopy;  Laterality: N/A;  0800   CARDIAC CATHETERIZATION  08/2008   CHOLECYSTECTOMY     COLONOSCOPY N/A 03/03/2015   SLF: 1. One colon polyp removed 2. Mild diverticulosis in the sigmoid colon 3. Small internal hemorrhoids   CORONARY ARTERY BYPASS GRAFT  10/08/2012   SHOULDER SURGERY     TUBAL LIGATION       Home Medications:  Prior to Admission medications   Medication Sig Start Date End Date Taking? Authorizing  Provider  cholecalciferol (VITAMIN D) 1000 UNITS tablet Take 1,000 Units by mouth daily.    [provider]  colestipol (COLESTID) 1 g tablet TAKE 1 TABLET BY MOUTH 30 MINUTES BEFORE BREAKFAST AND SUPPER 05/16/16   Gordy Levan, Eric A, NP  ELIQUIS 5 MG TABS tablet Take 5 mg by mouth 2 (two) times daily. 10/07/18   [provider]  insulin NPH-regular (NOVOLIN 70/30) (70-30) 100 UNIT/ML injection Inject 65 Units into the skin 2 (two) times daily with a meal. 45 units in am / 20 units at night    [provider]  Magnesium 400 MG CAPS Take 1  capsule by mouth daily. 11/12/13   Herminio Commons, MD  metFORMIN (GLUCOPHAGE) 1000 MG tablet Take 1,000 mg by mouth 2 (two) times daily with a meal.    [provider]  metoprolol succinate (TOPROL-XL) 50 MG 24 hr tablet Take 1 tablet (50 mg total) by mouth daily. Take with or immediately following a meal. 10/18/18 01/16/19  Strader, Fransisco Hertz, PA-C  Multiple Vitamin (MULTIVITAMIN) tablet Take 1 tablet by mouth daily.    [provider]  nitroGLYCERIN (NITROSTAT) 0.4 MG SL tablet Place 1 tablet (0.4 mg total) under the tongue every 5 (five) minutes as needed for chest pain. 06/05/17   Herminio Commons, MD  omeprazole (PRILOSEC) 20 MG capsule TAKE 1 CAPSULE BY MOUTH EVERY DAY 04/17/16   Mahala Menghini, PA-C  simvastatin (ZOCOR) 40 MG tablet Take 40 mg by mouth every evening.    [provider]  tiZANidine (ZANAFLEX) 4 MG tablet Take 4 mg by mouth daily.     [provider]  travoprost, benzalkonium, (TRAVATAN) 0.004 % ophthalmic solution Place 1 drop into both eyes at bedtime.    [provider]    Inpatient Medications: Scheduled Meds:  furosemide  40 mg Intravenous Once   Continuous Infusions:  PRN Meds:   Allergies:    Allergies  Allergen Reactions   Bee Venom    Codeine Nausea Only and Other (See Comments)    Other reaction(s): Other (See Comments) headache    Social History:   Social History   Socioeconomic History   Marital status: Divorced    Spouse name: Not on file   Number of children: Not on file   Years of education: Not on file   Highest education level: Not on file  Occupational History   Not on file  Social Needs   Financial resource strain: Not on file   Food insecurity:    Worry: Not on file    Inability: Not on file   Transportation needs:    Medical: Not on file    Non-medical: Not on file  Tobacco Use   Smoking status: Never Smoker   Smokeless tobacco: Never Used  Substance and  Sexual Activity   Alcohol use: No    Alcohol/week: 0.0 standard drinks   Drug use: No   Sexual activity: Not on file  Lifestyle   Physical activity:    Days per week: Not on file    Minutes per session: Not on file   Stress: Not on file  Relationships   Social connections:    Talks on phone: Not on file    Gets together: Not on file    Attends religious service: Not on file    Active member of club or organization: Not on file    Attends meetings of clubs or organizations: Not on file    Relationship  status: Not on file   Intimate partner violence:    Fear of current or ex partner: Not on file    Emotionally abused: Not on file    Physically abused: Not on file    Forced sexual activity: Not on file  Other Topics Concern   Not on file  Social History Narrative   Not on file     Family History:    Family History  Problem Relation Age of Onset   Diabetes Mother    Heart disease Mother    Colon cancer Paternal Uncle    Colon polyps Neg Hx       Review of Systems    General:  No chills, fever, night sweats or weight changes.  Cardiovascular:  No chest pain, palpitations, paroxysmal nocturnal dyspnea. Positive for dyspnea on exertion and edema.  Dermatological: No rash, lesions/masses Respiratory: No cough, Positive for dyspnea. Urologic: No hematuria, dysuria Abdominal:   No nausea, vomiting, diarrhea, bright red blood per rectum, melena, or hematemesis Neurologic:  No visual changes, wkns, changes in mental status. All other systems reviewed and are otherwise negative except as noted above.  Physical Exam/Data    Vitals:   11/29/18 0855 11/29/18 0857 11/29/18 0900 11/29/18 0915  BP: (!) 148/89  117/66   Pulse: 91  98 99  Resp:    (!) 25  Temp: 98.3 F (36.8 C)     SpO2: 92%  100% 99%  Weight:  116.1 kg    Height:  5' (1.524 m)     No intake or output data in the 24 hours ending 11/29/18 1015 Filed Weights   11/29/18 0857  Weight: 116.1 kg     Body mass index is 50 kg/m.   General: Pleasant obese Caucasian female appearing in NAD Psych: Normal affect. Neuro: Alert and oriented X 3. Moves all extremities spontaneously. HEENT: Normal  Neck: Supple without bruits. JVD difficult to assess secondary to body habitus. Lungs:  Resp regular and unlabored, decreased along bases bilaterally. Heart: Irregularly irregular. No s3, or murmurs. Abdomen: Soft, non-tender, non-distended, BS + x 4.  Extremities: No clubbing or cyanosis. 2+ pitting, tense edema bilaterally to just above knees. DP/PT/Radials 2+ and equal bilaterally.   Labs/Studies     Relevant CV Studies:  Echocardiogram: 09/2018   Laboratory Data:  Chemistry Recent Labs  Lab 11/25/18 1118 11/29/18 0915  NA 142 139  K 4.9 4.5  CL 107 107  CO2 24 19*  GLUCOSE 68 112*  BUN 68* 63*  CREATININE 3.52* 2.67*  CALCIUM 9.4 9.1  GFRNONAA  --  18*  GFRAA  --  21*  ANIONGAP  --  13    No results for input(s): PROT, ALBUMIN, AST, ALT, ALKPHOS, BILITOT in the last 168 hours. Hematology Recent Labs  Lab 11/29/18 0915  WBC 6.0  RBC 4.16  HGB 9.4*  HCT 32.4*  MCV 77.9*  MCH 22.6*  MCHC 29.0*  RDW 18.5*  PLT 177   Cardiac EnzymesNo results for input(s): TROPONINI in the last 168 hours. No results for input(s): TROPIPOC in the last 168 hours.  BNP Recent Labs  Lab 11/29/18 0915  BNP 1,482.0*    DDimer No results for input(s): DDIMER in the last 168 hours.  Radiology/Studies:  Dg Chest Portable 1 View  Result Date: 11/29/2018 CLINICAL DATA:  Increasing shortness of breath EXAM: PORTABLE CHEST 1 VIEW COMPARISON:  09/28/2018 FINDINGS: Cardiac shadow remains enlarged. Postsurgical changes are noted. Poor inspiratory effort is noted. No focal  infiltrate or sizable effusion is seen. No bony abnormality is noted. IMPRESSION: Poor inspiratory effort without focal abnormality. Electronically Signed   By: Inez Catalina M.D.   On: 11/29/2018 09:37     Assessment  & Plan    1. Acute on Chronic Combined Systolic and Diastolic CHF - most recent echocardiogram showed her EF was reduced to 25%. Unclear etiology, possibly tachycardia-mediated in the setting of new-onset atrial fibrillation.  - she has experienced progressive dyspnea on exertion, orthopnea, and edema for the past several weeks with variable renal function hindering titration of her diuretic regimen.  -  BNP elevated to 1482 on admission. Scheduled to receive IV Lasix 40mg  but this has not yet been administered.  - would plan to start IV Lasix 40mg  BID upon admission. Repeat BMET in AM. Follow I&O's along with daily weights. Continue PTA Toprol-XL. Not on ACE-I/ARB/ARNI at this time given variable renal function.   2. Persistent Atrial Fibrillation - follow rates this admission and titrate Toprol-XL as needed. Would anticipate DCCV once volume status improves.  - continue Eliquis 5mg  BID at admission. She has been compliant with this as an outpatient and has not missed any doses per her report.   3. CAD - s/p CABG in 10/2012. Has experienced worsening dyspnea in the setting of CHF and atrial fibrillation but denies any recent chest pain. Would anticipate a repeat echocardiogram several months from now and repeat ischemic evaluation at that time if EF remains reduced.  - continue BB and statin therapy.   4. HTN - continue to hold ACE-I in the setting of AKI. Remains on BB therapy.   5. Acute on Chronic Stage 3 CKD - baseline creatinine 1.6-1.7. Creatinine peaked at 3.52 with titration of diuretic therapy, improving to  2.67 today. Consider Nephrology consult if renal function worsens with diuresis.   6. Anemia - Hgb 9.4 today. She denies any evidence of active bleeding. Repeat CBC in AM given the use of anticoagulation.     For questions or updates, please contact [place] Please consult [email or web address] for contact info under Cardiology/STEMI.  Signed, Erma Heritage,  PA-C 11/29/2018, 10:15 AM Pager: [phone]  The patient was seen and examined, and I agree with the history, physical exam, assessment and plan as documented above, with modifications made. I have also personally reviewed all relevant documentation, old records, labs, and both radiographic and cardiovascular studies. I have also independently interpreted old and new ECG's.  She finally agreed to come to the ED today after several attempts to convince her to do so by both myself and B.  Strader PA-C.  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  She remains short of breath when walking 15 to 20 feet.  She has slept essentially upright in a recliner for several years.  She denies chest pain.  She has tense bilateral leg edema to just above the knees.  ECG which I personally reviewed demonstrates atrial fibrillation, 9 9 bpm.  She just received IV Lasix 40 mg and has not urinated yet in the ED.  We will plan to start IV Lasix 40 mg twice daily with close monitoring of renal function.  If renal function worsens, nephrology will need to be consulted.  Once she has adequately diuresed would plan to proceed with direct-current cardioversion early next week.  Continue Toprol-XL.  She has not missed any doses of Eliquis.  We will plan to repeat an  echocardiogram in several months and potentially an ischemic evaluation if EF remains significantly reduced.  Kate Sable, MD, [facility]  11/29/2018 11:50 AM

## 2018-11-29 NOTE — H&P (Signed)
History and Physical    Erica [name] ZOX:[id number] DOB: 10-Feb-1951 DOA: 11/29/2018  PCP: Curlene Labrum, MD   Patient coming from: Home  Chief Complaint: Progressive dyspnea on exertion  HPI: Erica [name] is a 67 y.o. female with medical history significant for CAD status post CABG in 2014, chronic combined systolic and diastolic CHF with LVEF 81% in 2020, persistent atrial fibrillation on Eliquis, hypertension, dyslipidemia, insulin-dependent diabetes, stage III CKD, prior CVA, and carotid artery stenosis who presented to the ED with progressive dyspnea on exertion with minimal activity noted.  She was on the phone with her cardiology office who had been asking her to come to the hospital for admission and diuresis and she was very audibly short of breath.  She is noted to have worsening lower extremity edema, but states that her weight has remained stable at home at approximately 256 pounds.  She initially had her home torsemide dose increased from 20 mg twice daily to 40 mg twice daily and metolazone was added, but her creatinine worsened at that time to 3.52.  Her baseline is usually around 1.7.   ED Course: Vital signs are currently stable, but patient is hypoxemic and requires 2.5 L nasal cannula.  She is in no acute respiratory distress and heart rate remains between 80 to 90 bpm with atrial fibrillation.  She has been given 40 mg of IV Lasix with no urination noted thus far.  Creatinine here is noted to be 2.67.  1 view chest x-ray with poor inspiratory effort noted and some cardiomegaly.  She has been assessed by cardiology with plans for diuresis using Lasix IV 40 mg twice daily and possible direct-current cardioversion early next week as needed.  Review of Systems: All others reviewed and otherwise negative.  Past Medical History:  Diagnosis Date  . Atrial fibrillation ([place])    a. diagnosed in 09/2018  . CAD (coronary artery disease)    a. s/p CABG in 10/2012  . Carcinoid  tumor   . Chest pain   . CHF (congestive heart failure) (HCC)    a. EF previously 40%, at 50% by repeat imaging in 03/2018  . Colon cancer ([place]) 2008  . DM (diabetes mellitus) ([place])   . HTN (hypertension)   . MI (myocardial infarction) ([place]) FEB 2014   Acute MI of anterior wall  . Pulmonary edema   . Stroke [facility]) 2010 RIGHT SIDED WEAKNESS    Past Surgical History:  Procedure Laterality Date  . BACTERIAL OVERGROWTH TEST N/A 03/18/2015   Procedure: BACTERIAL OVERGROWTH TEST;  Surgeon: Danie Binder, MD;  Location: [facility];  Service: Endoscopy;  Laterality: N/A;  0800  . CARDIAC CATHETERIZATION  08/2008  . CHOLECYSTECTOMY    . COLONOSCOPY N/A 03/03/2015   SLF: 1. One colon polyp removed 2. Mild diverticulosis in the sigmoid colon 3. Small internal hemorrhoids  . CORONARY ARTERY BYPASS GRAFT  10/08/2012  . SHOULDER SURGERY    . TUBAL LIGATION       reports that she has never smoked. She has never used smokeless tobacco. She reports that she does not drink alcohol or use drugs.  Allergies  Allergen Reactions  . Bee Venom   . Codeine Nausea Only and Other (See Comments)    Other reaction(s): Other (See Comments) headache    Family History  Problem Relation Age of Onset  . Diabetes Mother   . Heart disease Mother   . Colon cancer Paternal Uncle   . Colon  polyps Neg Hx     Prior to Admission medications   Medication Sig Start Date End Date Taking? Authorizing Provider  cholecalciferol (VITAMIN D) 1000 UNITS tablet Take 1,000 Units by mouth daily.   Yes [provider]  colestipol (COLESTID) 1 g tablet TAKE 1 TABLET BY MOUTH 30 MINUTES BEFORE BREAKFAST AND SUPPER Patient taking differently: Take 1 g by mouth daily.  05/16/16  Yes Gill, Eric A, NP  ELIQUIS 5 MG TABS tablet Take 5 mg by mouth 2 (two) times daily. 10/07/18  Yes [provider]  insulin NPH-regular (NOVOLIN 70/30) (70-30) 100 UNIT/ML injection Inject 15-20 Units into the skin See admin  instructions. Inject 20 units every morning and 15 units nightly.   Yes [provider]  Magnesium 400 MG CAPS Take 1 capsule by mouth daily. 11/12/13  Yes Herminio Commons, MD  metFORMIN (GLUCOPHAGE) 1000 MG tablet Take 500-1,000 mg by mouth See admin instructions. Take 1000mg  every morning and 500 every night.   Yes [provider]  metoprolol succinate (TOPROL-XL) 50 MG 24 hr tablet Take 1 tablet (50 mg total) by mouth daily. Take with or immediately following a meal. 10/18/18 01/16/19 Yes Strader, Fransisco Hertz, PA-C  Multiple Vitamin (MULTIVITAMIN) tablet Take 1 tablet by mouth daily.   Yes [provider]  omeprazole (PRILOSEC) 20 MG capsule TAKE 1 CAPSULE BY MOUTH EVERY DAY Patient taking differently: Take 20 mg by mouth daily.  04/17/16  Yes Mahala Menghini, PA-C  simvastatin (ZOCOR) 40 MG tablet Take 40 mg by mouth every evening.   Yes [provider]  tiZANidine (ZANAFLEX) 4 MG tablet Take 4 mg by mouth at bedtime.    Yes [provider]  travoprost, benzalkonium, (TRAVATAN) 0.004 % ophthalmic solution Place 1 drop into both eyes at bedtime.   Yes [provider]  nitroGLYCERIN (NITROSTAT) 0.4 MG SL tablet Place 1 tablet (0.4 mg total) under the tongue every 5 (five) minutes as needed for chest pain. 06/05/17   Herminio Commons, MD    Physical Exam: Vitals:   11/29/18 1030 11/29/18 1045 11/29/18 1100 11/29/18 1115  BP:      Pulse: 65 87 84 74  Resp: (!) 23 (!) 28 18 (!) 21  Temp:      SpO2: 100% 99% 100% 100%  Weight:      Height:        Constitutional: NAD, calm, comfortable Vitals:   11/29/18 1030 11/29/18 1045 11/29/18 1100 11/29/18 1115  BP:      Pulse: 65 87 84 74  Resp: (!) 23 (!) 28 18 (!) 21  Temp:      SpO2: 100% 99% 100% 100%  Weight:      Height:       Eyes: lids and conjunctivae normal ENMT: Mucous membranes are moist.  Neck: normal, supple Respiratory: clear to auscultation bilaterally. Normal  respiratory effort. No accessory muscle use.  Currently on 2.5 L nasal cannula. Cardiovascular: Irregular rate and rhythm, no murmurs.  1-2+ extremity edema. Abdomen: no tenderness, no distention. Bowel sounds positive.  Musculoskeletal:  No joint deformity upper and lower extremities.   Skin: no rashes, lesions, ulcers.  Psychiatric: Normal judgment and insight. Alert and oriented x 3. Normal mood.   Labs on Admission: I have personally reviewed following labs and imaging studies  CBC: Recent Labs  Lab 11/29/18 0915  WBC 6.0  HGB 9.4*  HCT 32.4*  MCV 77.9*  PLT 989   Basic Metabolic Panel: Recent  Labs  Lab 11/25/18 1118 11/29/18 0915  NA 142 139  K 4.9 4.5  CL 107 107  CO2 24 19*  GLUCOSE 68 112*  BUN 68* 63*  CREATININE 3.52* 2.67*  CALCIUM 9.4 9.1  MG  --  2.1   GFR: Estimated Creatinine Clearance: 24.1 mL/min (A) (by C-G formula based on SCr of 2.67 mg/dL (H)). Liver Function Tests: No results for input(s): AST, ALT, ALKPHOS, BILITOT, PROT, ALBUMIN in the last 168 hours. No results for input(s): LIPASE, AMYLASE in the last 168 hours. No results for input(s): AMMONIA in the last 168 hours. Coagulation Profile: No results for input(s): INR, PROTIME in the last 168 hours. Cardiac Enzymes: No results for input(s): CKTOTAL, CKMB, CKMBINDEX, TROPONINI in the last 168 hours. BNP (last 3 results) No results for input(s): PROBNP in the last 8760 hours. HbA1C: No results for input(s): HGBA1C in the last 72 hours. CBG: No results for input(s): GLUCAP in the last 168 hours. Lipid Profile: No results for input(s): CHOL, HDL, LDLCALC, TRIG, CHOLHDL, LDLDIRECT in the last 72 hours. Thyroid Function Tests: No results for input(s): TSH, T4TOTAL, FREET4, T3FREE, THYROIDAB in the last 72 hours. Anemia Panel: No results for input(s): VITAMINB12, FOLATE, FERRITIN, TIBC, IRON, RETICCTPCT in the last 72 hours. Urine analysis:    Component Value Date/Time   COLORURINE ORANGE  BIOCHEMICALS MAY BE AFFECTED BY COLOR (A) 11/05/2008 0627   APPEARANCEUR TURBID (A) 11/05/2008 0627   LABSPEC 1.027 11/05/2008 0627   PHURINE 5.5 11/05/2008 0627   GLUCOSEU NEGATIVE 11/05/2008 0627   HGBUR NEGATIVE 11/05/2008 0627   BILIRUBINUR SMALL (A) 11/05/2008 0627   KETONESUR 15 (A) 11/05/2008 0627   PROTEINUR 30 (A) 11/05/2008 0627   UROBILINOGEN 0.2 11/05/2008 0627   NITRITE POSITIVE (A) 11/05/2008 0627   LEUKOCYTESUR SMALL (A) 11/05/2008 0627    Radiological Exams on Admission: Dg Chest Portable 1 View  Result Date: 11/29/2018 CLINICAL DATA:  Increasing shortness of breath EXAM: PORTABLE CHEST 1 VIEW COMPARISON:  09/28/2018 FINDINGS: Cardiac shadow remains enlarged. Postsurgical changes are noted. Poor inspiratory effort is noted. No focal infiltrate or sizable effusion is seen. No bony abnormality is noted. IMPRESSION: Poor inspiratory effort without focal abnormality. Electronically Signed   By: Inez Catalina M.D.   On: 11/29/2018 09:37    EKG: Independently reviewed.  Atrial fibrillation, 99 bpm.  Assessment/Plan Active Problems:   Acute on chronic systolic (congestive) heart failure (HCC)    1. Acute hypoxemic respiratory failure secondary to acute on chronic combined systolic and diastolic CHF.  Recent echocardiogram with LVEF 25% noted on 09/2018.  BNP is noted to be 1482.  Continue IV Lasix 40 mg twice daily and monitor strict I's and O's and daily weights.  Continue Toprol-XL as well and monitor closely on telemetry with repeat labs in a.m. 2. Persistent atrial fibrillation-rate controlled.  Continue on Toprol-XL and monitor on telemetry.  Maintain on Eliquis 5 mg twice daily. 3. CAD.  Continue beta-blocker and statin therapy.  No signs of ischemia currently noted and no chest pain. 4. Hypertension.  Continue to hold ACE inhibitor and maintain on beta-blocker therapy.  Blood pressures currently stable. 5. AKI on CKD stage III.  Baseline creatinine 1.6-1.7.  Improved to  2.67 from 3.52 peak earlier.  Will monitor and repeat labs and continue on strict I's and O's.  Avoid further nephrotoxic agents to include ACE inhibitor.  Will consult nephrology as needed. 6. Anemia.  No overt bleeding identified.  Will follow repeat CBC in  a.m.  EDP to obtain fecal occult blood.   DVT prophylaxis: Eliquis Code Status: Full code Family Communication: We will update family Disposition Plan: Admit for IV diuresis Consults called: Cardiology Admission status: Inpatient, telemetry   Pratik Darleen Crocker DO [facility] Pager [phone]  If 7PM-7AM, please contact night-coverage [email or web address] Password [facility]  11/29/2018, 12:23 PM

## 2018-11-30 LAB — GLUCOSE, CAPILLARY
Glucose-Capillary: 115 mg/dL — ABNORMAL HIGH (ref 70–99)
Glucose-Capillary: 145 mg/dL — ABNORMAL HIGH (ref 70–99)
Glucose-Capillary: 193 mg/dL — ABNORMAL HIGH (ref 70–99)
Glucose-Capillary: 242 mg/dL — ABNORMAL HIGH (ref 70–99)

## 2018-11-30 LAB — BASIC METABOLIC PANEL
Anion gap: 9 (ref 5–15)
BUN: 61 mg/dL — ABNORMAL HIGH (ref 8–23)
CO2: 22 mmol/L (ref 22–32)
Calcium: 8.7 mg/dL — ABNORMAL LOW (ref 8.9–10.3)
Chloride: 108 mmol/L (ref 98–111)
Creatinine, Ser: 2.63 mg/dL — ABNORMAL HIGH (ref 0.44–1.00)
GFR calc Af Amer: 21 mL/min — ABNORMAL LOW (ref >60)
GFR calc non Af Amer: 18 mL/min — ABNORMAL LOW (ref >60)
Glucose, Bld: 119 mg/dL — ABNORMAL HIGH (ref 70–99)
Potassium: 4.9 mmol/L (ref 3.5–5.1)
Sodium: 139 mmol/L (ref 135–145)

## 2018-11-30 LAB — CBC
HCT: 30.6 % — ABNORMAL LOW (ref 36.0–46.0)
Hemoglobin: 8.5 g/dL — ABNORMAL LOW (ref 12.0–15.0)
MCH: 22.1 pg — ABNORMAL LOW (ref 26.0–34.0)
MCHC: 27.8 g/dL — ABNORMAL LOW (ref 30.0–36.0)
MCV: 79.7 fL — ABNORMAL LOW (ref 80.0–100.0)
Platelets: 151 10*3/uL (ref 150–400)
RBC: 3.84 MIL/uL — ABNORMAL LOW (ref 3.87–5.11)
RDW: 18.5 % — ABNORMAL HIGH (ref 11.5–15.5)
WBC: 4.9 10*3/uL (ref 4.0–10.5)
nRBC: 0 % (ref 0.0–0.2)

## 2018-11-30 LAB — MAGNESIUM: Magnesium: 2 mg/dL (ref 1.7–2.4)

## 2018-11-30 MED ORDER — FUROSEMIDE 10 MG/ML IJ SOLN
80.0000 mg | Freq: Two times a day (BID) | INTRAMUSCULAR | Status: DC
  Administered 2018-11-30 – 2018-12-04 (×8): 80 mg via INTRAVENOUS
  Filled 2018-11-30 (×8): qty 8

## 2018-11-30 NOTE — Progress Notes (Signed)
PROGRESS NOTE    Erica [name]  LKJ:[id number] DOB: [birth year] DOA: 11/29/2018 PCP: Curlene Labrum, MD   Brief Narrative:  Per HPI: OVEDA [name] is a 68 y.o. female with medical history significant for CAD status post CABG in 2014, chronic combined systolic and diastolic CHF with LVEF 79% in 2020, persistent atrial fibrillation on Eliquis, hypertension, dyslipidemia, insulin-dependent diabetes, stage III CKD, prior CVA, and carotid artery stenosis who presented to the ED with progressive dyspnea on exertion with minimal activity noted.  She was on the phone with her cardiology office who had been asking her to come to the hospital for admission and diuresis and she was very audibly short of breath.  She is noted to have worsening lower extremity edema, but states that her weight has remained stable at home at approximately 256 pounds.  She initially had her home torsemide dose increased from 20 mg twice daily to 40 mg twice daily and metolazone was added, but her creatinine worsened at that time to 3.52.  Her baseline is usually around 1.7.  Patient was admitted with acute hypoxemic respiratory failure secondary to acute on chronic combined systolic and diastolic CHF.  She appears not to have diuresed too much overnight on her current IV Lasix dose.  Assessment & Plan:   Active Problems:   Acute on chronic systolic (congestive) heart failure ([place])   1. Acute hypoxemic respiratory failure secondary to acute on chronic combined systolic and diastolic CHF.  Recent echocardiogram with LVEF 25% noted on 09/2018.  BNP is noted to be 1482.  Increase IV Lasix to 80 mg twice a day as she was only noted to have approximately 800 cc of urine output over the last 24 hours. Continue Toprol-XL as well and monitor closely on telemetry with repeat labs in a.m. 2. Persistent atrial fibrillation-rate controlled.  Continue on Toprol-XL and monitor on telemetry.  Maintain on Eliquis 5 mg twice daily. 3. CAD.   Continue beta-blocker and statin therapy.  No signs of ischemia currently noted and no chest pain. 4. Hypertension.  Continue to hold ACE inhibitor and maintain on beta-blocker therapy.  Blood pressures currently stable. 5. AKI on CKD stage III-stable.  Baseline creatinine 1.6-1.7.  Improved to 2.63 from 3.52 peak earlier.  Will monitor and repeat labs and continue on strict I's and O's.  Avoid further nephrotoxic agents to include ACE inhibitor.  Will consult nephrology as needed. 6. Anemia.  No overt bleeding identified.  Will follow repeat CBC in a.m.   FOBT negative.  Continue on Eliquis. 7. Type 2 diabetes with some hypoglycemia.  Hold 70/30 insulin for now and maintain on SSI.  Monitor closely.   DVT prophylaxis: Eliquis Code Status: Full code Family Communication: Patient will call family Disposition Plan: Continue IV diuresis with increased dose today.  Follow a.m. labs.   Consultants:   Cardiology  Procedures:   None  Antimicrobials:   None   Subjective: Patient seen and evaluated today with no new acute complaints or concerns.  She has had poor response to IV Lasix with only 800 cc of output noted overnight.  Additionally, she was noted to be hypoglycemic upon arrival to the floor for which 70/30 insulin has been withheld.  Objective: Vitals:   11/29/18 2200 11/30/18 0536 11/30/18 0600 11/30/18 0946  BP: (!) 99/42 (!) 80/40 113/69   Pulse:  74    Resp:  18    Temp:  97.9 F (36.6 C)    TempSrc:  Oral  SpO2:  100%  99%  Weight:   116.3 kg   Height:        Intake/Output Summary (Last 24 hours) at 11/30/2018 1057 Last data filed at 11/30/2018 1056 Gross per 24 hour  Intake 966 ml  Output 300 ml  Net 666 ml   Filed Weights   11/29/18 1450 11/29/18 1450 11/30/18 0600  Weight: 114.5 kg 114.5 kg 116.3 kg    Examination:  General exam: Appears calm and comfortable  Respiratory system: Clear to auscultation. Respiratory effort normal.  Continues to remain on  nasal cannula. Cardiovascular system: S1 & S2 heard, RRR. No JVD, murmurs, rubs, gallops or clicks. No pedal edema. Gastrointestinal system: Abdomen is nondistended, soft and nontender. No organomegaly or masses felt. Normal bowel sounds heard. Central nervous system: Alert and oriented. No focal neurological deficits. Extremities: Symmetric 5 x 5 power. Skin: No rashes, lesions or ulcers Psychiatry: Judgement and insight appear normal. Mood & affect appropriate.     Data Reviewed: I have personally reviewed following labs and imaging studies  CBC: Recent Labs  Lab 11/29/18 0915 11/30/18 0526  WBC 6.0 4.9  HGB 9.4* 8.5*  HCT 32.4* 30.6*  MCV 77.9* 79.7*  PLT 177 628   Basic Metabolic Panel: Recent Labs  Lab 11/25/18 1118 11/29/18 0915 11/30/18 0526  NA 142 139 139  K 4.9 4.5 4.9  CL 107 107 108  CO2 24 19* 22  GLUCOSE 68 112* 119*  BUN 68* 63* 61*  CREATININE 3.52* 2.67* 2.63*  CALCIUM 9.4 9.1 8.7*  MG  --  2.1 2.0   GFR: Estimated Creatinine Clearance: 24.5 mL/min (A) (by C-G formula based on SCr of 2.63 mg/dL (H)). Liver Function Tests: No results for input(s): AST, ALT, ALKPHOS, BILITOT, PROT, ALBUMIN in the last 168 hours. No results for input(s): LIPASE, AMYLASE in the last 168 hours. No results for input(s): AMMONIA in the last 168 hours. Coagulation Profile: No results for input(s): INR, PROTIME in the last 168 hours. Cardiac Enzymes: No results for input(s): CKTOTAL, CKMB, CKMBINDEX, TROPONINI in the last 168 hours. BNP (last 3 results) No results for input(s): PROBNP in the last 8760 hours. HbA1C: No results for input(s): HGBA1C in the last 72 hours. CBG: Recent Labs  Lab 11/29/18 1627 11/29/18 1655 11/29/18 1736 11/29/18 2019 11/30/18 0751  GLUCAP 36* 43* 85 108* 115*   Lipid Profile: No results for input(s): CHOL, HDL, LDLCALC, TRIG, CHOLHDL, LDLDIRECT in the last 72 hours. Thyroid Function Tests: No results for input(s): TSH, T4TOTAL,  FREET4, T3FREE, THYROIDAB in the last 72 hours. Anemia Panel: No results for input(s): VITAMINB12, FOLATE, FERRITIN, TIBC, IRON, RETICCTPCT in the last 72 hours. Sepsis Labs: No results for input(s): PROCALCITON, LATICACIDVEN in the last 168 hours.  No results found for this or any previous visit (from the past 240 hour(s)).       Radiology Studies: Dg Chest Portable 1 View  Result Date: 11/29/2018 CLINICAL DATA:  Increasing shortness of breath EXAM: PORTABLE CHEST 1 VIEW COMPARISON:  09/28/2018 FINDINGS: Cardiac shadow remains enlarged. Postsurgical changes are noted. Poor inspiratory effort is noted. No focal infiltrate or sizable effusion is seen. No bony abnormality is noted. IMPRESSION: Poor inspiratory effort without focal abnormality. Electronically Signed   By: Inez Catalina M.D.   On: 11/29/2018 09:37        Scheduled Meds: . apixaban  5 mg Oral BID  . cholecalciferol  1,000 Units Oral Daily  . colestipol  1 g Oral Daily  .  furosemide  80 mg Intravenous Q12H  . insulin aspart  0-5 Units Subcutaneous QHS  . insulin aspart  0-9 Units Subcutaneous TID WC  . latanoprost  1 drop Both Eyes QHS  . magnesium oxide  400 mg Oral Daily  . metoprolol succinate  50 mg Oral Daily  . multivitamin with minerals  1 tablet Oral Daily  . pantoprazole  40 mg Oral Daily  . simvastatin  40 mg Oral QPM  . sodium chloride flush  3 mL Intravenous Q12H  . tiZANidine  4 mg Oral QHS   Continuous Infusions: . sodium chloride       LOS: 1 day    Time spent: 30 minutes    Erica Steichen Darleen Crocker, DO [facility] Pager [phone]  If 7PM-7AM, please contact night-coverage [email or web address] Password [facility] 11/30/2018, 10:57 AM

## 2018-12-01 LAB — CBC
HCT: 28.9 % — ABNORMAL LOW (ref 36.0–46.0)
Hemoglobin: 8.3 g/dL — ABNORMAL LOW (ref 12.0–15.0)
MCH: 22.7 pg — ABNORMAL LOW (ref 26.0–34.0)
MCHC: 28.7 g/dL — ABNORMAL LOW (ref 30.0–36.0)
MCV: 79 fL — ABNORMAL LOW (ref 80.0–100.0)
Platelets: 124 10*3/uL — ABNORMAL LOW (ref 150–400)
RBC: 3.66 MIL/uL — ABNORMAL LOW (ref 3.87–5.11)
RDW: 18.4 % — ABNORMAL HIGH (ref 11.5–15.5)
WBC: 4.3 10*3/uL (ref 4.0–10.5)
nRBC: 0 % (ref 0.0–0.2)

## 2018-12-01 LAB — BASIC METABOLIC PANEL
Anion gap: 8 (ref 5–15)
BUN: 54 mg/dL — ABNORMAL HIGH (ref 8–23)
CO2: 24 mmol/L (ref 22–32)
Calcium: 8.7 mg/dL — ABNORMAL LOW (ref 8.9–10.3)
Chloride: 107 mmol/L (ref 98–111)
Creatinine, Ser: 2.15 mg/dL — ABNORMAL HIGH (ref 0.44–1.00)
GFR calc Af Amer: 27 mL/min — ABNORMAL LOW (ref >60)
GFR calc non Af Amer: 23 mL/min — ABNORMAL LOW (ref >60)
Glucose, Bld: 197 mg/dL — ABNORMAL HIGH (ref 70–99)
Potassium: 4.4 mmol/L (ref 3.5–5.1)
Sodium: 139 mmol/L (ref 135–145)

## 2018-12-01 LAB — GLUCOSE, CAPILLARY
Glucose-Capillary: 181 mg/dL — ABNORMAL HIGH (ref 70–99)
Glucose-Capillary: 194 mg/dL — ABNORMAL HIGH (ref 70–99)
Glucose-Capillary: 230 mg/dL — ABNORMAL HIGH (ref 70–99)
Glucose-Capillary: 183 mg/dL — ABNORMAL HIGH (ref 70–99)

## 2018-12-01 LAB — MAGNESIUM: Magnesium: 1.9 mg/dL (ref 1.7–2.4)

## 2018-12-01 MED ORDER — INSULIN ASPART PROT & ASPART (70-30 MIX) 100 UNIT/ML ~~LOC~~ SUSP
8.0000 [IU] | Freq: Two times a day (BID) | SUBCUTANEOUS | Status: DC
  Administered 2018-12-01 – 2018-12-05 (×8): 8 [IU] via SUBCUTANEOUS
  Filled 2018-12-01: qty 10

## 2018-12-01 NOTE — Progress Notes (Signed)
PROGRESS NOTE    Erica [name]  NWG:[id number] DOB: 1950/12/07 DOA: 11/29/2018 PCP: Curlene Labrum, MD   Brief Narrative:  Per HPI: Erica Dillon KNIGHTis a 68 y.o.femalewith medical history significant forCAD status post CABG in 2014, chronic combined systolic and diastolic CHF with LVEF 57% in 2020, persistent atrial fibrillation on Eliquis, hypertension, dyslipidemia, insulin-dependent diabetes, stage III CKD, prior CVA, and carotid artery stenosis who presented to the ED with progressive dyspnea on exertion with minimal activity noted. She was on the phone with her cardiology office who had been asking her to come to the hospital for admission and diuresis and she was very audibly short of breath. She is noted to have worsening lower extremity edema, but states that her weight has remained stable at home at approximately 256 pounds. She initially had her home torsemide dose increased from 20 mg twice daily to 40 mg twice daily and metolazone was added, but her creatinine worsened at that time to 3.52. Her baseline is usually around 1.7.  Patient was admitted with acute hypoxemic respiratory failure secondary to acute on chronic combined systolic and diastolic CHF.  She appears to be feeling somewhat better this morning and has diuresed better on increased dose of Lasix as noted below.  Assessment & Plan:   Active Problems:   Acute on chronic systolic (congestive) heart failure ([place])   1. Acute hypoxemic respiratory failure secondary to acute on chronic combined systolic and diastolic CHF. Recent echocardiogram with LVEF 25% noted on 09/2018. BNP is noted to be 1482.    Continue on IV Lasix 80 mg twice a day as she has had 2300 mL of urine output overnight and appears to be improving.  Hold Toprol-XL for now given soft blood pressure readings. 2. Persistent atrial fibrillation-rate controlled. Hold Toprol-XL for now and monitor on telemetry. Maintain on Eliquis 5 mg twice daily. 3.  CAD. Continue statin therapy. No signs of ischemia currently noted and no chest pain. 4. Hypertension. Continue to hold ACE inhibitor and hold beta-blocker. Blood pressures currently soft. 5. AKI on CKD stage III- improving. Baseline creatinine 1.6-1.7.Will monitor and repeat labs and continue on strict I's and O's. Avoid further nephrotoxic agents to include ACE inhibitor. Will consult nephrology as needed. 6. Anemia. No overt bleeding identified. Will follow repeat CBC in a.m.  FOBT negative.  Continue on Eliquis. 7. Type 2 diabetes.    Continue on SSI and start back on half dose of 70/30 insulin at home with noted increase in blood glucose levels now.   DVT prophylaxis: Eliquis Code Status: Full code Family Communication: Patient will call family Disposition Plan: Continue IV diuresis today.  Follow a.m. labs.   Consultants:   Cardiology  Procedures:   None  Antimicrobials:   None  Subjective: Patient seen and evaluated today with no new acute complaints or concerns. No acute concerns or events noted overnight.  He is to have diuresed a little over a liter last night and states that she is feeling somewhat better.  Objective: Vitals:   11/30/18 0946 11/30/18 1330 11/30/18 2143 12/01/18 0643  BP:  (!) 107/57 97/65 (!) 93/59  Pulse:  79 (!) 108 89  Resp:  18 18 (!) 21  Temp:  98.1 F (36.7 C) 98.4 F (36.9 C) 98.3 F (36.8 C)  TempSrc:  Oral Oral Oral  SpO2: 99% 96% 99% 100%  Weight:    114 kg  Height:        Intake/Output Summary (Last 24 hours)  at 12/01/2018 1224 Last data filed at 12/01/2018 0939 Gross per 24 hour  Intake 963 ml  Output 1700 ml  Net -737 ml   Filed Weights   11/29/18 1450 11/30/18 0600 12/01/18 0643  Weight: 114.5 kg 116.3 kg 114 kg    Examination:  General exam: Appears calm and comfortable  Respiratory system: Clear to auscultation. Respiratory effort normal.  Currently on 2.5 L nasal cannula. Cardiovascular system: S1  & S2 heard, RRR. No JVD, murmurs, rubs, gallops or clicks. No pedal edema. Gastrointestinal system: Abdomen is nondistended, soft and nontender. No organomegaly or masses felt. Normal bowel sounds heard. Central nervous system: Alert and oriented. No focal neurological deficits. Extremities: Symmetric 5 x 5 power. Skin: No rashes, lesions or ulcers Psychiatry: Judgement and insight appear normal. Mood & affect appropriate.     Data Reviewed: I have personally reviewed following labs and imaging studies  CBC: Recent Labs  Lab 11/29/18 0915 11/30/18 0526 12/01/18 0608  WBC 6.0 4.9 4.3  HGB 9.4* 8.5* 8.3*  HCT 32.4* 30.6* 28.9*  MCV 77.9* 79.7* 79.0*  PLT 177 151 749*   Basic Metabolic Panel: Recent Labs  Lab 11/25/18 1118 11/29/18 0915 11/30/18 0526 12/01/18 0608  NA 142 139 139 139  K 4.9 4.5 4.9 4.4  CL 107 107 108 107  CO2 24 19* 22 24  GLUCOSE 68 112* 119* 197*  BUN 68* 63* 61* 54*  CREATININE 3.52* 2.67* 2.63* 2.15*  CALCIUM 9.4 9.1 8.7* 8.7*  MG  --  2.1 2.0 1.9   GFR: Estimated Creatinine Clearance: 29.6 mL/min (A) (by C-G formula based on SCr of 2.15 mg/dL (H)). Liver Function Tests: No results for input(s): AST, ALT, ALKPHOS, BILITOT, PROT, ALBUMIN in the last 168 hours. No results for input(s): LIPASE, AMYLASE in the last 168 hours. No results for input(s): AMMONIA in the last 168 hours. Coagulation Profile: No results for input(s): INR, PROTIME in the last 168 hours. Cardiac Enzymes: No results for input(s): CKTOTAL, CKMB, CKMBINDEX, TROPONINI in the last 168 hours. BNP (last 3 results) No results for input(s): PROBNP in the last 8760 hours. HbA1C: No results for input(s): HGBA1C in the last 72 hours. CBG: Recent Labs  Lab 11/30/18 1141 11/30/18 1630 11/30/18 2146 12/01/18 0747 12/01/18 1122  GLUCAP 145* 193* 242* 181* 194*   Lipid Profile: No results for input(s): CHOL, HDL, LDLCALC, TRIG, CHOLHDL, LDLDIRECT in the last 72 hours. Thyroid  Function Tests: No results for input(s): TSH, T4TOTAL, FREET4, T3FREE, THYROIDAB in the last 72 hours. Anemia Panel: No results for input(s): VITAMINB12, FOLATE, FERRITIN, TIBC, IRON, RETICCTPCT in the last 72 hours. Sepsis Labs: No results for input(s): PROCALCITON, LATICACIDVEN in the last 168 hours.  No results found for this or any previous visit (from the past 240 hour(s)).       Radiology Studies: No results found.      Scheduled Meds: . apixaban  5 mg Oral BID  . cholecalciferol  1,000 Units Oral Daily  . colestipol  1 g Oral Daily  . furosemide  80 mg Intravenous Q12H  . insulin aspart  0-5 Units Subcutaneous QHS  . insulin aspart  0-9 Units Subcutaneous TID WC  . latanoprost  1 drop Both Eyes QHS  . magnesium oxide  400 mg Oral Daily  . multivitamin with minerals  1 tablet Oral Daily  . pantoprazole  40 mg Oral Daily  . simvastatin  40 mg Oral QPM  . sodium chloride flush  3 mL Intravenous  Q12H  . tiZANidine  4 mg Oral QHS   Continuous Infusions: . sodium chloride       LOS: 2 days    Time spent: 30 minutes    Pratik Darleen Crocker, DO [facility] Pager [phone]  If 7PM-7AM, please contact night-coverage [email or web address] Password [facility] 12/01/2018, 12:24 PM

## 2018-12-02 DIAGNOSIS — I5023 Acute on chronic systolic (congestive) heart failure: Secondary | ICD-10-CM

## 2018-12-02 LAB — GLUCOSE, CAPILLARY
Glucose-Capillary: 151 mg/dL — ABNORMAL HIGH (ref 70–99)
Glucose-Capillary: 173 mg/dL — ABNORMAL HIGH (ref 70–99)
Glucose-Capillary: 198 mg/dL — ABNORMAL HIGH (ref 70–99)
Glucose-Capillary: 167 mg/dL — ABNORMAL HIGH (ref 70–99)

## 2018-12-02 LAB — BASIC METABOLIC PANEL
Anion gap: 11 (ref 5–15)
BUN: 49 mg/dL — ABNORMAL HIGH (ref 8–23)
CO2: 25 mmol/L (ref 22–32)
Calcium: 8.7 mg/dL — ABNORMAL LOW (ref 8.9–10.3)
Chloride: 103 mmol/L (ref 98–111)
Creatinine, Ser: 1.86 mg/dL — ABNORMAL HIGH (ref 0.44–1.00)
GFR calc Af Amer: 32 mL/min — ABNORMAL LOW (ref >60)
GFR calc non Af Amer: 28 mL/min — ABNORMAL LOW (ref >60)
Glucose, Bld: 166 mg/dL — ABNORMAL HIGH (ref 70–99)
Potassium: 3.9 mmol/L (ref 3.5–5.1)
Sodium: 139 mmol/L (ref 135–145)

## 2018-12-02 LAB — MAGNESIUM: Magnesium: 1.9 mg/dL (ref 1.7–2.4)

## 2018-12-02 MED ORDER — SALINE SPRAY 0.65 % NA SOLN: 1.0000 | NASAL | Status: DC | PRN

## 2018-12-02 MED ORDER — SODIUM CHLORIDE 0.9% FLUSH: 3.0000 mL | INTRAVENOUS | Status: DC | PRN

## 2018-12-02 MED ORDER — SODIUM CHLORIDE 0.9% FLUSH
3.0000 mL | Freq: Two times a day (BID) | INTRAVENOUS | Status: DC
  Administered 2018-12-02 – 2018-12-05 (×6): 3 mL via INTRAVENOUS

## 2018-12-02 MED ORDER — SODIUM CHLORIDE 0.9 % IV SOLN: 250.0000 mL | INTRAVENOUS | Status: DC

## 2018-12-02 NOTE — Progress Notes (Addendum)
Progress Note  Patient Name: Erica [name] Date of Encounter: 12/02/2018  Primary Cardiologist: Kate Sable MD  Subjective   Feels somewhat better in terms of shortness of breath.  No definitive sense of palpitations, no chest pain.  Leg swelling is somewhat better although continues to be chronic on the right.  Inpatient Medications    Scheduled Meds: . apixaban  5 mg Oral BID  . cholecalciferol  1,000 Units Oral Daily  . colestipol  1 g Oral Daily  . furosemide  80 mg Intravenous Q12H  . insulin aspart  0-5 Units Subcutaneous QHS  . insulin aspart  0-9 Units Subcutaneous TID WC  . insulin aspart protamine- aspart  8 Units Subcutaneous BID WC  . latanoprost  1 drop Both Eyes QHS  . magnesium oxide  400 mg Oral Daily  . multivitamin with minerals  1 tablet Oral Daily  . pantoprazole  40 mg Oral Daily  . simvastatin  40 mg Oral QPM  . sodium chloride flush  3 mL Intravenous Q12H  . tiZANidine  4 mg Oral QHS   Continuous Infusions: . sodium chloride     PRN Meds: sodium chloride, acetaminophen **OR** acetaminophen, nitroGLYCERIN, ondansetron **OR** ondansetron (ZOFRAN) IV, sodium chloride flush   Vital Signs    Vitals:   12/01/18 1430 12/01/18 2235 12/02/18 0500 12/02/18 0523  BP: (!) 88/71 110/83  107/79  Pulse: 91 100  86  Resp: 19 (!) 22  (!) 23  Temp: 98.2 F (36.8 C) 98.1 F (36.7 C)  98.2 F (36.8 C)  TempSrc: Oral Oral  Oral  SpO2: 98% 100%  100%  Weight:   111.6 kg   Height:        Intake/Output Summary (Last 24 hours) at 12/02/2018 0819 Last data filed at 12/02/2018 0500 Gross per 24 hour  Intake 843 ml  Output 1800 ml  Net -957 ml   Filed Weights   11/30/18 0600 12/01/18 0643 12/02/18 0500  Weight: 116.3 kg 114 kg 111.6 kg    Telemetry    Atrial fibrillation.  Personally reviewed.  ECG    Tracing from 11/29/2018 shows rate controlled atrial fibrillation with nonspecific ST-T changes and poor anterior R wave progression.  Personally  reviewed.  Physical Exam   GEN: No acute distress.   Neck: No JVD. Cardiac:  Irregularly irregular, no gallop.  Respiratory:  Decreased breath sounds at the bases, no wheezes.Marland Kitchen GI: Soft, nontender, bowel sounds present. MS:  Chronic appearing leg edema, right more significant than left, 2+. Neuro:  Nonfocal. Psych: Alert and oriented x 3. Normal affect.  Labs    Chemistry Recent Labs  Lab 11/30/18 0526 12/01/18 0608 12/02/18 0601  NA 139 139 139  K 4.9 4.4 3.9  CL 108 107 103  CO2 22 24 25   GLUCOSE 119* 197* 166*  BUN 61* 54* 49*  CREATININE 2.63* 2.15* 1.86*  CALCIUM 8.7* 8.7* 8.7*  GFRNONAA 18* 23* 28*  GFRAA 21* 27* 32*  ANIONGAP 9 8 11      Hematology Recent Labs  Lab 11/29/18 0915 11/30/18 0526 12/01/18 0608  WBC 6.0 4.9 4.3  RBC 4.16 3.84* 3.66*  HGB 9.4* 8.5* 8.3*  HCT 32.4* 30.6* 28.9*  MCV 77.9* 79.7* 79.0*  MCH 22.6* 22.1* 22.7*  MCHC 29.0* 27.8* 28.7*  RDW 18.5* 18.5* 18.4*  PLT 177 151 124*    BNP Recent Labs  Lab 11/29/18 0915  BNP 1,482.0*     Radiology    No results found.  Cardiac Studies   Echocardiogram 09/30/2018 [facility]): Severe reduced LVEF of 25% with hypokinesis to akinesis of the mid to apical anterior and anteroseptal walls and apical dyskinesis, diastolic function not assessed, mitral annular calcification with mild mitral regurgitation, moderate left atrial enlargement, mild aortic regurgitation, mild pulmonic regurgitation, mildly dilated right ventricle with mildly decreased contraction, mild tricuspid regurgitation, estimated PASP 40 mmHg.  Patient Profile     68 y.o. female of CAD status post CABG in 2014, carotid artery disease, persistent atrial fibrillation, hypertension, hyperlipidemia, type 2 diabetes mellitus, CKD stage III, previous stroke, and ischemic cardiomyopathy with LVEF 25%.  She presents with acute on chronic combined heart failure in the setting of acute on chronic renal failure.  Assessment &  Plan    1.  Acute on chronic combined heart failure.  With IV Lasix she has diuresed net output of approximately 2000 cc.  Feels better but still not at baseline.  2.  Ischemic cardiomyopathy with LVEF 25% by echocardiogram in February.  She was on Toprol-XL as an outpatient, no ACE inhibitor/ARB/ANRI/Aldactone with CKD stage III.  Currently not on beta-blocker (held by primary team).  Heart rate generally in the 80s to 90s at rest and atrial fibrillation.  3.  Acute on chronic renal failure with CKD stage III at baseline.  Creatinine down to 1.86 which is near baseline.  4.  Essential hypertension, blood pressure low normal range.  5.  Persistent atrial fibrillation (present since February).  She states that she has been consistent with Eliquis use as an outpatient.  I reviewed the chart and discussed the situation with the patient.  We will continue IV Lasix for further diuresis, she is improving clinically and creatinine also has improved.  Agree with holding Toprol-XL for now particularly in light of decompensated heart failure.  We will plan on a cardioversion tomorrow (scheduled for 10:15 AM) in an attempt to restore sinus rhythm.  Can resume beta-blocker thereafter.  Signed, Rozann Lesches, MD  12/02/2018, 8:19 AM

## 2018-12-02 NOTE — Care Management Important Message (Signed)
Important Message  Patient Details  Name: Erica [name] MRN: [id number] Date of Birth: June 06, 1951   Medicare Important Message Given:  Yes    [name] 12/02/2018, 2:57 PM

## 2018-12-02 NOTE — Progress Notes (Signed)
PROGRESS NOTE    Erica [name]  VCB:[id number] DOB: Nov 27, 1950 DOA: 11/29/2018 PCP: Curlene Labrum, MD   Brief Narrative:  Per HPI: Erica Barbary KNIGHTis a 68 y.o.femalewith medical history significant forCAD status post CABG in 2014, chronic combined systolic and diastolic CHF with LVEF 38% in 2020, persistent atrial fibrillation on Eliquis, hypertension, dyslipidemia, insulin-dependent diabetes, stage III CKD, prior CVA, and carotid artery stenosis who presented to the ED with progressive dyspnea on exertion with minimal activity noted. She was on the phone with her cardiology office who had been asking her to come to the hospital for admission and diuresis and she was very audibly short of breath. She is noted to have worsening lower extremity edema, but states that her weight has remained stable at home at approximately 256 pounds. She initially had her home torsemide dose increased from 20 mg twice daily to 40 mg twice daily and metolazone was added, but her creatinine worsened at that time to 3.52. Her baseline is usually around 1.7.  Patient was admitted with acute hypoxemic respiratory failure secondary to acute on chronic combined systolic and diastolic CHF.  She appears to be feeling somewhat better this morning and has diuresed better on increased dose of Lasix with serum creatinine showing improvement as well.  Cardiology plans for cardioversion in a.m.  Assessment & Plan:   Active Problems:   Acute on chronic systolic (congestive) heart failure ([place])  1. Acute hypoxemic respiratory failure secondary to acute on chronic combined systolic and diastolic CHF. Recent echocardiogram with LVEF 25% noted on 09/2018. BNP is noted to be 1482.  Continue on IV Lasix 80 mg twice a day as she continues to maintain great urine output with decreased creatinine levels on labs.   Continue to hold Toprol-XL for now given soft blood pressure readings and likely resume after cardioversion. 2.  Persistent atrial fibrillation-rate controlled. Hold Toprol-XL for now and monitor on telemetry. Maintain on Eliquis 5 mg twice daily. 3. CAD. Continue statin therapy. No signs of ischemia currently noted and no chest pain. 4. Hypertension. Continue to hold ACE inhibitor and hold beta-blocker. Blood pressures currently soft. 5. AKI on CKD stage III- improving. Baseline creatinine 1.6-1.7.Will monitor and repeat labs and continue on strict I's and O's. Avoid further nephrotoxic agents to include ACE inhibitor.  6. Anemia. No overt bleeding identified. Will follow repeat CBC in a.m.FOBT negative. Continue on Eliquis. 7. Type 2 diabetes.   Continue on SSI and start back on half dose of 70/30 insulin at home with noted increase in blood glucose levels now.   DVT prophylaxis:Eliquis Code Status:Full code Family Communication:Patient will call family Disposition Plan:Continue IV diuresis today. Follow a.m. labs.  Cardiology plans for cardioversion in a.m.   Consultants:  Cardiology  Procedures:  None  Antimicrobials:   None  Subjective: Patient seen and evaluated today with no new acute complaints or concerns. No acute concerns or events noted overnight.  She appears to be less short of breath and continues to maintain great urine output.  Objective: Vitals:   12/01/18 1430 12/01/18 2235 12/02/18 0500 12/02/18 0523  BP: (!) 88/71 110/83  107/79  Pulse: 91 100  86  Resp: 19 (!) 22  (!) 23  Temp: 98.2 F (36.8 C) 98.1 F (36.7 C)  98.2 F (36.8 C)  TempSrc: Oral Oral  Oral  SpO2: 98% 100%  100%  Weight:   111.6 kg   Height:        Intake/Output Summary (Last 24  hours) at 12/02/2018 1001 Last data filed at 12/02/2018 0900 Gross per 24 hour  Intake 600 ml  Output 2600 ml  Net -2000 ml   Filed Weights   11/30/18 0600 12/01/18 0643 12/02/18 0500  Weight: 116.3 kg 114 kg 111.6 kg    Examination:  General exam: Appears calm and comfortable,  obese Respiratory system: Clear to auscultation. Respiratory effort normal.  Continues on nasal cannula Cardiovascular system: S1 & S2 heard, RRR. No JVD, murmurs, rubs, gallops or clicks. No pedal edema. Gastrointestinal system: Abdomen is nondistended, soft and nontender. No organomegaly or masses felt. Normal bowel sounds heard. Central nervous system: Alert and oriented. No focal neurological deficits. Extremities: Symmetric 5 x 5 power. Skin: No rashes, lesions or ulcers Psychiatry: Judgement and insight appear normal. Mood & affect appropriate.     Data Reviewed: I have personally reviewed following labs and imaging studies  CBC: Recent Labs  Lab 11/29/18 0915 11/30/18 0526 12/01/18 0608  WBC 6.0 4.9 4.3  HGB 9.4* 8.5* 8.3*  HCT 32.4* 30.6* 28.9*  MCV 77.9* 79.7* 79.0*  PLT 177 151 850*   Basic Metabolic Panel: Recent Labs  Lab 11/25/18 1118 11/29/18 0915 11/30/18 0526 12/01/18 0608 12/02/18 0601  NA 142 139 139 139 139  K 4.9 4.5 4.9 4.4 3.9  CL 107 107 108 107 103  CO2 24 19* 22 24 25   GLUCOSE 68 112* 119* 197* 166*  BUN 68* 63* 61* 54* 49*  CREATININE 3.52* 2.67* 2.63* 2.15* 1.86*  CALCIUM 9.4 9.1 8.7* 8.7* 8.7*  MG  --  2.1 2.0 1.9 1.9   GFR: Estimated Creatinine Clearance: 33.8 mL/min (A) (by C-G formula based on SCr of 1.86 mg/dL (H)). Liver Function Tests: No results for input(s): AST, ALT, ALKPHOS, BILITOT, PROT, ALBUMIN in the last 168 hours. No results for input(s): LIPASE, AMYLASE in the last 168 hours. No results for input(s): AMMONIA in the last 168 hours. Coagulation Profile: No results for input(s): INR, PROTIME in the last 168 hours. Cardiac Enzymes: No results for input(s): CKTOTAL, CKMB, CKMBINDEX, TROPONINI in the last 168 hours. BNP (last 3 results) No results for input(s): PROBNP in the last 8760 hours. HbA1C: No results for input(s): HGBA1C in the last 72 hours. CBG: Recent Labs  Lab 12/01/18 0747 12/01/18 1122 12/01/18 1609  12/01/18 2237 12/02/18 0755  GLUCAP 181* 194* 230* 183* 151*   Lipid Profile: No results for input(s): CHOL, HDL, LDLCALC, TRIG, CHOLHDL, LDLDIRECT in the last 72 hours. Thyroid Function Tests: No results for input(s): TSH, T4TOTAL, FREET4, T3FREE, THYROIDAB in the last 72 hours. Anemia Panel: No results for input(s): VITAMINB12, FOLATE, FERRITIN, TIBC, IRON, RETICCTPCT in the last 72 hours. Sepsis Labs: No results for input(s): PROCALCITON, LATICACIDVEN in the last 168 hours.  No results found for this or any previous visit (from the past 240 hour(s)).       Radiology Studies: No results found.      Scheduled Meds: . apixaban  5 mg Oral BID  . cholecalciferol  1,000 Units Oral Daily  . colestipol  1 g Oral Daily  . furosemide  80 mg Intravenous Q12H  . insulin aspart  0-5 Units Subcutaneous QHS  . insulin aspart  0-9 Units Subcutaneous TID WC  . insulin aspart protamine- aspart  8 Units Subcutaneous BID WC  . latanoprost  1 drop Both Eyes QHS  . magnesium oxide  400 mg Oral Daily  . multivitamin with minerals  1 tablet Oral Daily  . pantoprazole  40 mg Oral Daily  . simvastatin  40 mg Oral QPM  . sodium chloride flush  3 mL Intravenous Q12H  . sodium chloride flush  3 mL Intravenous Q12H  . tiZANidine  4 mg Oral QHS   Continuous Infusions: . sodium chloride    . sodium chloride       LOS: 3 days    Time spent: 30 minutes    Kendall Justo Darleen Crocker, DO [facility] Pager [phone]  If 7PM-7AM, please contact night-coverage [email or web address] Password [email or web address] 12/02/2018, 10:01 AM

## 2018-12-02 NOTE — Progress Notes (Signed)
Pt had one episode of a nose bleed that lasted about 5 minutes. Saline nasal spray as well as humidified oxygen added as it was likely related to her nose drying out as she is not used to oxygen. Will continue to monitor.

## 2018-12-03 ENCOUNTER — Encounter (HOSPITAL_COMMUNITY)
Admission: EM | Disposition: A | Discharge status: Home Health | Payer: Self-pay | Source: Home / Self Care | Attending: Internal Medicine

## 2018-12-03 ENCOUNTER — Other Ambulatory Visit: Payer: Self-pay

## 2018-12-03 ENCOUNTER — Inpatient Hospital Stay (HOSPITAL_COMMUNITY): Payer: Medicare HMO | Admitting: Anesthesiology

## 2018-12-03 ENCOUNTER — Encounter (HOSPITAL_COMMUNITY): Payer: Self-pay

## 2018-12-03 DIAGNOSIS — I255 Ischemic cardiomyopathy: Secondary | ICD-10-CM

## 2018-12-03 DIAGNOSIS — I4819 Other persistent atrial fibrillation: Secondary | ICD-10-CM

## 2018-12-03 HISTORY — PX: CARDIOVERSION: SHX1299

## 2018-12-03 LAB — CBC
HCT: 29.5 % — ABNORMAL LOW (ref 36.0–46.0)
Hemoglobin: 8.5 g/dL — ABNORMAL LOW (ref 12.0–15.0)
MCH: 22.6 pg — ABNORMAL LOW (ref 26.0–34.0)
MCHC: 28.8 g/dL — ABNORMAL LOW (ref 30.0–36.0)
MCV: 78.5 fL — ABNORMAL LOW (ref 80.0–100.0)
Platelets: 112 10*3/uL — ABNORMAL LOW (ref 150–400)
RBC: 3.76 MIL/uL — ABNORMAL LOW (ref 3.87–5.11)
RDW: 18.2 % — ABNORMAL HIGH (ref 11.5–15.5)
WBC: 4.1 10*3/uL (ref 4.0–10.5)
nRBC: 0 % (ref 0.0–0.2)

## 2018-12-03 LAB — BASIC METABOLIC PANEL
Anion gap: 9 (ref 5–15)
BUN: 41 mg/dL — ABNORMAL HIGH (ref 8–23)
CO2: 28 mmol/L (ref 22–32)
Calcium: 8.8 mg/dL — ABNORMAL LOW (ref 8.9–10.3)
Chloride: 103 mmol/L (ref 98–111)
Creatinine, Ser: 1.54 mg/dL — ABNORMAL HIGH (ref 0.44–1.00)
GFR calc Af Amer: 40 mL/min — ABNORMAL LOW (ref >60)
GFR calc non Af Amer: 35 mL/min — ABNORMAL LOW (ref >60)
Glucose, Bld: 160 mg/dL — ABNORMAL HIGH (ref 70–99)
Potassium: 3.7 mmol/L (ref 3.5–5.1)
Sodium: 140 mmol/L (ref 135–145)

## 2018-12-03 LAB — GLUCOSE, CAPILLARY
Glucose-Capillary: 147 mg/dL — ABNORMAL HIGH (ref 70–99)
Glucose-Capillary: 141 mg/dL — ABNORMAL HIGH (ref 70–99)
Glucose-Capillary: 131 mg/dL — ABNORMAL HIGH (ref 70–99)
Glucose-Capillary: 287 mg/dL — ABNORMAL HIGH (ref 70–99)
Glucose-Capillary: 200 mg/dL — ABNORMAL HIGH (ref 70–99)

## 2018-12-03 SURGERY — CARDIOVERSION
Anesthesia: Monitor Anesthesia Care

## 2018-12-03 MED ORDER — LIDOCAINE 2% (20 MG/ML) 5 ML SYRINGE
INTRAMUSCULAR | Status: AC
  Filled 2018-12-03: qty 5

## 2018-12-03 MED ORDER — PROPOFOL 10 MG/ML IV BOLUS
INTRAVENOUS | Status: DC | PRN
  Administered 2018-12-03 (×2): 30 mg via INTRAVENOUS
  Administered 2018-12-03: 20 mg via INTRAVENOUS

## 2018-12-03 MED ORDER — METOPROLOL SUCCINATE ER 25 MG PO TB24
25.0000 mg | ORAL_TABLET | Freq: Every day | ORAL | Status: DC
  Administered 2018-12-03 – 2018-12-04 (×2): 25 mg via ORAL
  Filled 2018-12-03 (×3): qty 1

## 2018-12-03 MED ORDER — METOPROLOL TARTRATE 5 MG/5ML IV SOLN
INTRAVENOUS | Status: DC | PRN
  Administered 2018-12-03 (×2): 2.5 mg via INTRAVENOUS

## 2018-12-03 MED ORDER — PROMETHAZINE HCL 25 MG/ML IJ SOLN: 6.2500 mg | INTRAMUSCULAR | Status: DC | PRN

## 2018-12-03 MED ORDER — KETAMINE HCL 50 MG/5ML IJ SOSY
PREFILLED_SYRINGE | INTRAMUSCULAR | Status: AC
  Filled 2018-12-03: qty 5

## 2018-12-03 MED ORDER — SODIUM CHLORIDE 0.9% FLUSH
INTRAVENOUS | Status: AC
  Filled 2018-12-03: qty 10

## 2018-12-03 MED ORDER — AMIODARONE HCL 200 MG PO TABS
400.0000 mg | ORAL_TABLET | Freq: Two times a day (BID) | ORAL | Status: DC
  Administered 2018-12-03 – 2018-12-05 (×5): 400 mg via ORAL
  Filled 2018-12-03 (×5): qty 2

## 2018-12-03 MED ORDER — PROPOFOL 10 MG/ML IV BOLUS
INTRAVENOUS | Status: AC
  Filled 2018-12-03: qty 40

## 2018-12-03 MED ORDER — GLYCOPYRROLATE PF 0.2 MG/ML IJ SOSY
PREFILLED_SYRINGE | INTRAMUSCULAR | Status: AC
  Filled 2018-12-03: qty 1

## 2018-12-03 MED ORDER — POLYETHYLENE GLYCOL 3350 17 G PO PACK
17.0000 g | PACK | Freq: Every day | ORAL | Status: DC
  Administered 2018-12-03 – 2018-12-05 (×3): 17 g via ORAL
  Filled 2018-12-03 (×3): qty 1

## 2018-12-03 MED ORDER — LACTATED RINGERS IV SOLN
INTRAVENOUS | Status: DC
  Administered 2018-12-03: 10:00:00 via INTRAVENOUS

## 2018-12-03 MED ORDER — KETAMINE HCL 10 MG/ML IJ SOLN
INTRAMUSCULAR | Status: DC | PRN
  Administered 2018-12-03: 10 mg via INTRAVENOUS
  Administered 2018-12-03: 5 mg via INTRAVENOUS
  Administered 2018-12-03: 10 mg via INTRAVENOUS

## 2018-12-03 MED ORDER — MIDAZOLAM HCL 2 MG/2ML IJ SOLN: 0.5000 mg | Freq: Once | INTRAMUSCULAR | Status: DC | PRN

## 2018-12-03 NOTE — Progress Notes (Signed)
Mobility Note  Patient Details  Name: Erica [name] MRN: [id number] Date of Birth: 09-Jan-1951 Today's Date: 12/03/2018    Pt assisted transfer and short distance gait back to bed.  Used SPC and min A for safety.  Pt left in bed with call bell within reach, bed alarm set and RN in room at [place].  Ihor [place], [place]; CBIS [phone] 12/03/18  1630-->1645   Aldona Lento 12/03/2018, 4:49 PM

## 2018-12-03 NOTE — Interval H&P Note (Signed)
History and Physical Interval Note:  12/03/2018 10:03 AM  Patient presents for cardioversion as noted above. Medications reviewed. She is ready to proceed.  Satira Sark, M.D., F.A.C.C.

## 2018-12-03 NOTE — Anesthesia Procedure Notes (Signed)
Procedure Name: MAC Date/Time: 12/03/2018 10:08 AM Performed by: Andree Elk Amy A, CRNA Pre-anesthesia Checklist: Patient identified, Emergency Drugs available, Suction available, Timeout performed and Patient being monitored Patient Re-evaluated:Patient Re-evaluated prior to induction Oxygen Delivery Method: Non-rebreather mask

## 2018-12-03 NOTE — Progress Notes (Signed)
PROGRESS NOTE    Erica [name]  LKG:[id number] DOB: 1950-09-06 DOA: 11/29/2018 PCP: Curlene Labrum, MD   Brief Narrative:  Per HPI: Erica Dicker KNIGHTis a 68 y.o.femalewith medical history significant forCAD status post CABG in 2014, chronic combined systolic and diastolic CHF with LVEF 66% in 2020, persistent atrial fibrillation on Eliquis, hypertension, dyslipidemia, insulin-dependent diabetes, stage III CKD, prior CVA, and carotid artery stenosis who presented to the ED with progressive dyspnea on exertion with minimal activity noted. She was on the phone with her cardiology office who had been asking her to come to the hospital for admission and diuresis and she was very audibly short of breath. She is noted to have worsening lower extremity edema, but states that her weight has remained stable at home at approximately 256 pounds. She initially had her home torsemide dose increased from 20 mg twice daily to 40 mg twice daily and metolazone was added, but her creatinine worsened at that time to 3.52. Her baseline is usually around 1.7.  Patient was admitted with acute hypoxemic respiratory failure secondary to acute on chronic combined systolic and diastolic CHF.She appears to be feeling somewhat better this morning and has diuresed better on increased dose of Lasix with serum creatinine showing improvement as well.    4/28: Patient has undergone cardioversion this morning per cardiology and appears to have tolerated the procedure well and remained in sinus rhythm for a large majority of the time, but unfortunately seems to have gone back in atrial fibrillation for which Toprol-XL and amiodarone were initiated by cardiology.  Continue diuresis and follow-up ECG in a.m.  Assessment & Plan:   Active Problems:   Acute on chronic systolic (congestive) heart failure (HCC)   Persistent atrial fibrillation   1. Acute hypoxemic respiratory failure secondary to acute on chronic combined  systolic and diastolic CHF. Recent echocardiogram with LVEF 25% noted on 09/2018. BNP is noted to be 1482.Continue onIV Lasix 80 mg twice a day as she continues to maintain great urine output with decreased creatinine levels on labs.   Continues to have great urine output with -2.7 L noted in the last 24 hours. 2. Persistent atrial fibrillation-rate controlled status post cardioversion.  Patient was in sinus rhythm briefly after the procedure, but has returned to atrial fibrillation for which cardiology has initiated Toprol-XL as well as amiodarone twice daily. Maintain on Eliquis 5 mg twice daily. 3. CAD. Continue statin therapy. No signs of ischemia currently noted and no chest pain. 4. Hypertension.  Beta-blocker reinitiated.  Continue to hold ACE inhibitor. 5. AKI on CKD stage III-improving. Baseline creatinine 1.6-1.7.Will monitor and repeat labs and continue on strict I's and O's. Avoid further nephrotoxic agents to include ACE inhibitor.  6. Anemia. No overt bleeding identified. Will follow repeat CBC in a.m.FOBT negative. Continue on Eliquis. 7. Type 2 diabetes.Continue on SSI and start back on half dose of 70/30 insulin at home with noted increase in blood glucose levels now.   DVT prophylaxis:Eliquis Code Status:Full code Family Communication:Patient will call family Disposition Plan:Continue IV diuresis today. Follow a.m. labs.    And started Toprol-XL and amiodarone with follow-up ECG in a.m.  Appreciate further recommendations.   Consultants:  Cardiology  Procedures:  Cardioversion 4/28  Antimicrobials:   None  Subjective: Patient seen and evaluated today with no new acute complaints or concerns. No acute concerns or events noted overnight.  She appears to be less short of breath and continues to maintain great urine output.  Objective:  Vitals:   12/03/18 0936 12/03/18 1045 12/03/18 1100 12/03/18 1115  BP: 131/66 (!) 101/55 108/74  111/65  Pulse:  74 73 65  Resp:   20 (!) 26  Temp:  98 F (36.7 C)    TempSrc:      SpO2:  100% 94% 98%  Weight:      Height:        Intake/Output Summary (Last 24 hours) at 12/03/2018 1257 Last data filed at 12/03/2018 1115 Gross per 24 hour  Intake 830 ml  Output 3500 ml  Net -2670 ml   Filed Weights   12/02/18 0500 12/03/18 0500 12/03/18 0913  Weight: 111.6 kg 109.2 kg 109.2 kg    Examination:  General exam: Appears calm and comfortable, obese Respiratory system: Clear to auscultation. Respiratory effort normal.  Currently on nasal cannula. Cardiovascular system: S1 & S2 heard, irregular. No JVD, murmurs, rubs, gallops or clicks. No pedal edema. Gastrointestinal system: Abdomen is nondistended, soft and nontender. No organomegaly or masses felt. Normal bowel sounds heard. Central nervous system: Alert and oriented. No focal neurological deficits. Extremities: Symmetric 5 x 5 power. Skin: No rashes, lesions or ulcers Psychiatry: Judgement and insight appear normal. Mood & affect appropriate.     Data Reviewed: I have personally reviewed following labs and imaging studies  CBC: Recent Labs  Lab 11/29/18 0915 11/30/18 0526 12/01/18 0608 12/03/18 0600  WBC 6.0 4.9 4.3 4.1  HGB 9.4* 8.5* 8.3* 8.5*  HCT 32.4* 30.6* 28.9* 29.5*  MCV 77.9* 79.7* 79.0* 78.5*  PLT 177 151 124* 643*   Basic Metabolic Panel: Recent Labs  Lab 11/29/18 0915 11/30/18 0526 12/01/18 0608 12/02/18 0601 12/03/18 0600  NA 139 139 139 139 140  K 4.5 4.9 4.4 3.9 3.7  CL 107 108 107 103 103  CO2 19* 22 24 25 28   GLUCOSE 112* 119* 197* 166* 160*  BUN 63* 61* 54* 49* 41*  CREATININE 2.67* 2.63* 2.15* 1.86* 1.54*  CALCIUM 9.1 8.7* 8.7* 8.7* 8.8*  MG 2.1 2.0 1.9 1.9  --    GFR: Estimated Creatinine Clearance: 40.3 mL/min (A) (by C-G formula based on SCr of 1.54 mg/dL (H)). Liver Function Tests: No results for input(s): AST, ALT, ALKPHOS, BILITOT, PROT, ALBUMIN in the last 168 hours. No  results for input(s): LIPASE, AMYLASE in the last 168 hours. No results for input(s): AMMONIA in the last 168 hours. Coagulation Profile: No results for input(s): INR, PROTIME in the last 168 hours. Cardiac Enzymes: No results for input(s): CKTOTAL, CKMB, CKMBINDEX, TROPONINI in the last 168 hours. BNP (last 3 results) No results for input(s): PROBNP in the last 8760 hours. HbA1C: No results for input(s): HGBA1C in the last 72 hours. CBG: Recent Labs  Lab 12/02/18 1643 12/02/18 2221 12/03/18 0750 12/03/18 0917 12/03/18 1050  GLUCAP 198* 167* 147* 141* 131*   Lipid Profile: No results for input(s): CHOL, HDL, LDLCALC, TRIG, CHOLHDL, LDLDIRECT in the last 72 hours. Thyroid Function Tests: No results for input(s): TSH, T4TOTAL, FREET4, T3FREE, THYROIDAB in the last 72 hours. Anemia Panel: No results for input(s): VITAMINB12, FOLATE, FERRITIN, TIBC, IRON, RETICCTPCT in the last 72 hours. Sepsis Labs: No results for input(s): PROCALCITON, LATICACIDVEN in the last 168 hours.  No results found for this or any previous visit (from the past 240 hour(s)).       Radiology Studies: No results found.      Scheduled Meds: . amiodarone  400 mg Oral BID  . apixaban  5 mg Oral BID  .  cholecalciferol  1,000 Units Oral Daily  . colestipol  1 g Oral Daily  . furosemide  80 mg Intravenous Q12H  . insulin aspart  0-5 Units Subcutaneous QHS  . insulin aspart  0-9 Units Subcutaneous TID WC  . insulin aspart protamine- aspart  8 Units Subcutaneous BID WC  . latanoprost  1 drop Both Eyes QHS  . magnesium oxide  400 mg Oral Daily  . metoprolol succinate  25 mg Oral Daily  . multivitamin with minerals  1 tablet Oral Daily  . pantoprazole  40 mg Oral Daily  . simvastatin  40 mg Oral QPM  . sodium chloride flush  3 mL Intravenous Q12H  . sodium chloride flush  3 mL Intravenous Q12H  . tiZANidine  4 mg Oral QHS   Continuous Infusions: . sodium chloride    . sodium chloride        LOS: 4 days    Time spent: 30 minutes    Donnica Jarnagin Darleen Crocker, DO [facility] Pager [phone]  If 7PM-7AM, please contact night-coverage [email or web address] Password [facility] [place] 12/03/2018, 12:57 PM

## 2018-12-03 NOTE — Progress Notes (Signed)
   Progress Note  Patient Name: Erica [name] Date of Encounter: 12/03/2018  I followed up on patient in PACU after cardioversion.  Nurse reported that she had been in sinus rhythm on telemetry for the large majority of time, however looked to be back in atrial fibrillation on my assessment.  This is not particularly surprising given the amount of atrial and ventricular ectopy she had following the procedure.  I will start her back on Toprol-XL 25 mg daily and also on oral amiodarone load at 400 mg twice daily.  This might help her to convert back, if not we will continue to work on consolidating medical regimen and she can have repeat cardioversion later as an outpatient.  Follow-up ECG in the a.m.  Signed, Rozann Lesches, MD  12/03/2018, 11:31 AM

## 2018-12-03 NOTE — Anesthesia Preprocedure Evaluation (Signed)
Anesthesia Evaluation  Patient identified by MRN, date of birth, ID band Patient awake    Reviewed: Allergy & Precautions, NPO status , Patient's Chart, lab work & pertinent test results, reviewed documented beta blocker date and time   Airway Mallampati: II  TM Distance: >3 FB Neck ROM: Full  Mouth opening: Limited Mouth Opening  Dental  (+) Edentulous Upper, Edentulous Lower   Pulmonary neg pulmonary ROS,    breath sounds clear to auscultation + decreased breath sounds      Cardiovascular Exercise Tolerance: Poor hypertension, Pt. on medications + CAD, + Past MI, + CABG, +CHF and + DOE  + dysrhythmias Atrial Fibrillation II Rhythm:Irregular Rate:Abnormal     Neuro/Psych R sided weakness after 2010 CVA -states uses brace when walks - states can usually go 3 blocks prior to this hosp. Diuresed- states less SOB today -denies o2 use at home -denies OSA CVA, Residual Symptoms negative psych ROS   GI/Hepatic Neg liver ROS, GERD  Medicated and Controlled,  Endo/Other  diabetes, Well Controlled, Type 1, Oral Hypoglycemic Agents, Insulin DependentMorbid obesityThyromegaly noted from Epic  Pt states she was unaware   Renal/GU negative Renal ROS  negative genitourinary   Musculoskeletal negative musculoskeletal ROS (+)   Abdominal   Peds negative pediatric ROS (+)  Hematology negative hematology ROS (+)   Anesthesia Other Findings   Reproductive/Obstetrics negative OB ROS                             Anesthesia Physical Anesthesia Plan  ASA: IV  Anesthesia Plan: MAC   Post-op Pain Management:    Induction: Intravenous  PONV Risk Score and Plan:   Airway Management Planned: Simple Face Mask and Nasal Cannula  Additional Equipment:   Intra-op Plan:   Post-operative Plan: Extubation in OR  Informed Consent: I have reviewed the patients History and Physical, chart, labs and discussed the  procedure including the risks, benefits and alternatives for the proposed anesthesia with the patient or authorized representative who has indicated his/her understanding and acceptance.     Dental advisory given  Plan Discussed with: CRNA  Anesthesia Plan Comments: (Full PPE use planned  Will try without GETA, with GETA avail for back up as needed explained to pt  Q & A, WTP with same )        Anesthesia Quick Evaluation

## 2018-12-03 NOTE — H&P (View-Only) (Signed)
Progress Note  Patient Name: Erica [name] Date of Encounter: 12/03/2018  Primary Cardiologist: Kate Sable, MD  Subjective   Did not move around much yesterday.  She has not been short of breath at rest, no palpitations or chest pain.  Leg swelling continues to improve, still more prominent on the right.  Inpatient Medications    Scheduled Meds: . [MAR Hold] apixaban  5 mg Oral BID  . [MAR Hold] cholecalciferol  1,000 Units Oral Daily  . [MAR Hold] colestipol  1 g Oral Daily  . [MAR Hold] furosemide  80 mg Intravenous Q12H  . [MAR Hold] insulin aspart  0-5 Units Subcutaneous QHS  . [MAR Hold] insulin aspart  0-9 Units Subcutaneous TID WC  . [MAR Hold] insulin aspart protamine- aspart  8 Units Subcutaneous BID WC  . [MAR Hold] latanoprost  1 drop Both Eyes QHS  . [MAR Hold] magnesium oxide  400 mg Oral Daily  . [MAR Hold] multivitamin with minerals  1 tablet Oral Daily  . [MAR Hold] pantoprazole  40 mg Oral Daily  . [MAR Hold] simvastatin  40 mg Oral QPM  . [MAR Hold] sodium chloride flush  3 mL Intravenous Q12H  . [MAR Hold] sodium chloride flush  3 mL Intravenous Q12H  . [MAR Hold] tiZANidine  4 mg Oral QHS   Continuous Infusions: . [MAR Hold] sodium chloride    . sodium chloride     PRN Meds: [MAR Hold] sodium chloride, [MAR Hold] acetaminophen **OR** [MAR Hold] acetaminophen, [MAR Hold] nitroGLYCERIN, [MAR Hold] ondansetron **OR** [MAR Hold] ondansetron (ZOFRAN) IV, [MAR Hold] sodium chloride, [MAR Hold] sodium chloride flush, [MAR Hold] sodium chloride flush   Vital Signs    Vitals:   12/02/18 2030 12/02/18 2127 12/03/18 0500 12/03/18 0617  BP:  116/61  112/61  Pulse:  (!) 106  96  Resp:    19  Temp:  99 F (37.2 C)  98.3 F (36.8 C)  TempSrc:  Oral  Oral  SpO2: 98% 98%  100%  Weight:   109.2 kg   Height:        Intake/Output Summary (Last 24 hours) at 12/03/2018 0851 Last data filed at 12/03/2018 0600 Gross per 24 hour  Intake 600 ml  Output 3300  ml  Net -2700 ml   Filed Weights   12/01/18 0643 12/02/18 0500 12/03/18 0500  Weight: 114 kg 111.6 kg 109.2 kg    Telemetry    Atrial fibrillation. Personally reviewed.  ECG    Tracing from 12/03/2018 shows atrial fibrillation with decreased R wave progression, nonspecific ST changes.  Personally reviewed.  Physical Exam   GEN: No acute distress.   Neck: No JVD. Cardiac:  Irregularly irregular, no gallop.  Respiratory:  Decreased breath sounds at the bases. GI: Soft, nontender, bowel sounds present. MS:  Chronic appearing leg edema, right worse than left. No deformity. Neuro:  Nonfocal. Psych: Alert and oriented x 3. Normal affect.  Labs    Chemistry Recent Labs  Lab 12/01/18 0608 12/02/18 0601 12/03/18 0600  NA 139 139 140  K 4.4 3.9 3.7  CL 107 103 103  CO2 24 25 28   GLUCOSE 197* 166* 160*  BUN 54* 49* 41*  CREATININE 2.15* 1.86* 1.54*  CALCIUM 8.7* 8.7* 8.8*  GFRNONAA 23* 28* 35*  GFRAA 27* 32* 40*  ANIONGAP 8 11 9      Hematology Recent Labs  Lab 11/30/18 0526 12/01/18 0608 12/03/18 0600  WBC 4.9 4.3 4.1  RBC 3.84* 3.66*  3.76*  HGB 8.5* 8.3* 8.5*  HCT 30.6* 28.9* 29.5*  MCV 79.7* 79.0* 78.5*  MCH 22.1* 22.7* 22.6*  MCHC 27.8* 28.7* 28.8*  RDW 18.5* 18.4* 18.2*  PLT 151 124* 112*    BNP Recent Labs  Lab 11/29/18 0915  BNP 1,482.0*     Radiology    No results found.  Cardiac Studies   Echocardiogram 09/30/2018 [facility]): Severe reduced LVEF of 25% with hypokinesis to akinesis of the mid to apical anterior and anteroseptal walls and apical dyskinesis, diastolic function not assessed, mitral annular calcification with mild mitral regurgitation, moderate left atrial enlargement, mild aortic regurgitation, mild pulmonic regurgitation, mildly dilated right ventricle with mildly decreased contraction, mild tricuspid regurgitation, estimated PASP 40 mmHg.  Patient Profile     68 y.o. female with a history of CAD status post CABG in 2014,  carotid artery disease, persistent atrial fibrillation, hypertension, hyperlipidemia, type 2 diabetes mellitus, CKD stage III, previous stroke, and ischemic cardiomyopathy with LVEF 25%.  She presents with acute on chronic combined heart failure in the setting of acute on chronic renal failure.  Assessment & Plan    1.  Persistent atrial fibrillation, present since February.  She continues on Eliquis, at this point Toprol-XL is on hold.  Plan is for elective cardioversion today in hopes that restoring sinus rhythm will help with heart failure symptoms.  2.  Acute on chronic combined heart failure.  She has diuresed nearly 5 L during admission on IV Lasix.  3.  Ischemic cardiomyopathy with LVEF approximately 25% by echocardiogram in February.  Toprol-XL currently on hold.  She is not on ACE inhibitor/ARB/ANRI/Aldactone with CKD stage III.  Can further adjust therapy once she is back in sinus rhythm.  4.  Acute on chronic renal failure with CKD stage III at baseline.  Creatinine has returned to baseline at 1.54.  5.  Essential hypertension, blood pressure stable.  Proceed with direct-current cardioversion today.  Continue Eliquis.  We will further modify medical therapy directed at her cardiomyopathy once she is back in sinus rhythm.  Might be able to tolerate combination of low-dose hydralazine and nitrate along with beta-blocker.  Continue IV Lasix for now.  Signed, Rozann Lesches, MD  12/03/2018, 8:51 AM

## 2018-12-03 NOTE — Transfer of Care (Signed)
Immediate Anesthesia Transfer of Care Note  Patient: Erica [name] [facility]  Procedure(s) Performed: CARDIOVERSION (N/A )  Patient Location: PACU  Anesthesia Type:MAC  Level of Consciousness: awake, alert , oriented and patient cooperative  Airway & Oxygen Therapy: Patient Spontanous Breathing and Patient connected to face mask oxygen  Post-op Assessment: Report given to RN and Post -op Vital signs reviewed and stable  Post vital signs: Reviewed and stable  Last Vitals:  Vitals Value Taken Time  BP    Temp    Pulse 72 12/03/2018 10:47 AM  Resp 24 12/03/2018 10:47 AM  SpO2 100 % 12/03/2018 10:47 AM  Vitals shown include unvalidated device data.  Last Pain:  Vitals:   12/03/18 0921  TempSrc: Oral  PainSc:       Patients Stated Pain Goal: 4 (71/06/26 9485)  Complications: No apparent anesthesia complications

## 2018-12-03 NOTE — Progress Notes (Signed)
Progress Note  Patient Name: Erica [name] Date of Encounter: 12/03/2018  Primary Cardiologist: Kate Sable, MD  Subjective   Did not move around much yesterday.  She has not been short of breath at rest, no palpitations or chest pain.  Leg swelling continues to improve, still more prominent on the right.  Inpatient Medications    Scheduled Meds: . [MAR Hold] apixaban  5 mg Oral BID  . [MAR Hold] cholecalciferol  1,000 Units Oral Daily  . [MAR Hold] colestipol  1 g Oral Daily  . [MAR Hold] furosemide  80 mg Intravenous Q12H  . [MAR Hold] insulin aspart  0-5 Units Subcutaneous QHS  . [MAR Hold] insulin aspart  0-9 Units Subcutaneous TID WC  . [MAR Hold] insulin aspart protamine- aspart  8 Units Subcutaneous BID WC  . [MAR Hold] latanoprost  1 drop Both Eyes QHS  . [MAR Hold] magnesium oxide  400 mg Oral Daily  . [MAR Hold] multivitamin with minerals  1 tablet Oral Daily  . [MAR Hold] pantoprazole  40 mg Oral Daily  . [MAR Hold] simvastatin  40 mg Oral QPM  . [MAR Hold] sodium chloride flush  3 mL Intravenous Q12H  . [MAR Hold] sodium chloride flush  3 mL Intravenous Q12H  . [MAR Hold] tiZANidine  4 mg Oral QHS   Continuous Infusions: . [MAR Hold] sodium chloride    . sodium chloride     PRN Meds: [MAR Hold] sodium chloride, [MAR Hold] acetaminophen **OR** [MAR Hold] acetaminophen, [MAR Hold] nitroGLYCERIN, [MAR Hold] ondansetron **OR** [MAR Hold] ondansetron (ZOFRAN) IV, [MAR Hold] sodium chloride, [MAR Hold] sodium chloride flush, [MAR Hold] sodium chloride flush   Vital Signs    Vitals:   12/02/18 2030 12/02/18 2127 12/03/18 0500 12/03/18 0617  BP:  116/61  112/61  Pulse:  (!) 106  96  Resp:    19  Temp:  99 F (37.2 C)  98.3 F (36.8 C)  TempSrc:  Oral  Oral  SpO2: 98% 98%  100%  Weight:   109.2 kg   Height:        Intake/Output Summary (Last 24 hours) at 12/03/2018 0851 Last data filed at 12/03/2018 0600 Gross per 24 hour  Intake 600 ml  Output 3300  ml  Net -2700 ml   Filed Weights   12/01/18 0643 12/02/18 0500 12/03/18 0500  Weight: 114 kg 111.6 kg 109.2 kg    Telemetry    Atrial fibrillation. Personally reviewed.  ECG    Tracing from 12/03/2018 shows atrial fibrillation with decreased R wave progression, nonspecific ST changes.  Personally reviewed.  Physical Exam   GEN: No acute distress.   Neck: No JVD. Cardiac:  Irregularly irregular, no gallop.  Respiratory:  Decreased breath sounds at the bases. GI: Soft, nontender, bowel sounds present. MS:  Chronic appearing leg edema, right worse than left. No deformity. Neuro:  Nonfocal. Psych: Alert and oriented x 3. Normal affect.  Labs    Chemistry Recent Labs  Lab 12/01/18 0608 12/02/18 0601 12/03/18 0600  NA 139 139 140  K 4.4 3.9 3.7  CL 107 103 103  CO2 24 25 28   GLUCOSE 197* 166* 160*  BUN 54* 49* 41*  CREATININE 2.15* 1.86* 1.54*  CALCIUM 8.7* 8.7* 8.8*  GFRNONAA 23* 28* 35*  GFRAA 27* 32* 40*  ANIONGAP 8 11 9      Hematology Recent Labs  Lab 11/30/18 0526 12/01/18 0608 12/03/18 0600  WBC 4.9 4.3 4.1  RBC 3.84* 3.66*  3.76*  HGB 8.5* 8.3* 8.5*  HCT 30.6* 28.9* 29.5*  MCV 79.7* 79.0* 78.5*  MCH 22.1* 22.7* 22.6*  MCHC 27.8* 28.7* 28.8*  RDW 18.5* 18.4* 18.2*  PLT 151 124* 112*    BNP Recent Labs  Lab 11/29/18 0915  BNP 1,482.0*     Radiology    No results found.  Cardiac Studies   Echocardiogram 09/30/2018 [facility]): Severe reduced LVEF of 25% with hypokinesis to akinesis of the mid to apical anterior and anteroseptal walls and apical dyskinesis, diastolic function not assessed, mitral annular calcification with mild mitral regurgitation, moderate left atrial enlargement, mild aortic regurgitation, mild pulmonic regurgitation, mildly dilated right ventricle with mildly decreased contraction, mild tricuspid regurgitation, estimated PASP 40 mmHg.  Patient Profile     68 y.o. female with a history of CAD status post CABG in 2014,  carotid artery disease, persistent atrial fibrillation, hypertension, hyperlipidemia, type 2 diabetes mellitus, CKD stage III, previous stroke, and ischemic cardiomyopathy with LVEF 25%.  She presents with acute on chronic combined heart failure in the setting of acute on chronic renal failure.  Assessment & Plan    1.  Persistent atrial fibrillation, present since February.  She continues on Eliquis, at this point Toprol-XL is on hold.  Plan is for elective cardioversion today in hopes that restoring sinus rhythm will help with heart failure symptoms.  2.  Acute on chronic combined heart failure.  She has diuresed nearly 5 L during admission on IV Lasix.  3.  Ischemic cardiomyopathy with LVEF approximately 25% by echocardiogram in February.  Toprol-XL currently on hold.  She is not on ACE inhibitor/ARB/ANRI/Aldactone with CKD stage III.  Can further adjust therapy once she is back in sinus rhythm.  4.  Acute on chronic renal failure with CKD stage III at baseline.  Creatinine has returned to baseline at 1.54.  5.  Essential hypertension, blood pressure stable.  Proceed with direct-current cardioversion today.  Continue Eliquis.  We will further modify medical therapy directed at her cardiomyopathy once she is back in sinus rhythm.  Might be able to tolerate combination of low-dose hydralazine and nitrate along with beta-blocker.  Continue IV Lasix for now.  Signed, Rozann Lesches, MD  12/03/2018, 8:51 AM

## 2018-12-03 NOTE — CV Procedure (Signed)
Elective direct-current cardioversion  Indication: Persistent atrial fibrillation  Description of procedure: After informed consent was obtained, the patient was taken to the PACU where a timeout was performed.  Anterior and posterior pads were placed and connected to a biphasic defibrillator. Deep sedation was achieved via propofol per the anesthesia service, please refer to their records for dosing details.  With sandbag on anterior chest pad, a single synchronized 120 J shock was delivered with brief return to sinus rhythm and then reversion to atrial fibrillation confirmed by ECG.  Following this a second synchronized 150 J shock was delivered with restoration of sinus rhythm, although continued bursts of atrial ectopy were noted intermittently.  She also received Lopressor total of 5 mg IV.  There were no immediate complications and follow-up rhythm was confirmed by ECG.  Given the degree of atrial ectopy thereafter, I would not be surprised if she returns to atrial fibrillation.  Satira Sark, M.D., F.A.C.C.

## 2018-12-03 NOTE — Anesthesia Postprocedure Evaluation (Signed)
Anesthesia Post Note  Patient: Erica [name]  Procedure(s) Performed: CARDIOVERSION (N/A )  Patient location during evaluation: PACU Anesthesia Type: MAC Level of consciousness: awake and alert and oriented Pain management: pain level controlled Vital Signs Assessment: post-procedure vital signs reviewed and stable Respiratory status: spontaneous breathing Cardiovascular status: stable Postop Assessment: no apparent nausea or vomiting Anesthetic complications: no     Last Vitals:  Vitals:   12/03/18 0921 12/03/18 0936  BP:  131/66  Pulse:    Resp:    Temp: 36.8 C   SpO2:      Last Pain:  Vitals:   12/03/18 0921  TempSrc: Oral  PainSc:                  ADAMS, AMY A

## 2018-12-03 NOTE — CV Procedure (Signed)
Electrical Cardioversion Procedure Note CODIE HAINER [id number] 01/02/1951  Procedure: Electrical Cardioversion Indications:  Atrial fibrillation   Procedure Details Consent: on chart Time Out: Verified patient identification, verified procedure, site/side was marked, verified correct patient position, special equipmen medications/allergies/relevent history reviewed, required imaging and test results available.  Yes @1013   Patient placed on cardiac monitor, pulse oximetry, supplemental oxygen as necessary.  Sedation given: per anesthesia 1009 started Pacer pads placed 1010 by Elizebeth Brooking anterior/posterior  Cardioverted 2 time(s).  Cardioverted at  #1 1015 120 joules with sandbag on pads anterior,  #2 1031 200 joules with sand bag in place on chest  Evaluation Findings: Post procedure EKG shows: sinus rhythm with large amount of ectopy medicated by anesthesia with Lopressor 5mg  IV Complications: none  Multiple EKG,s done post shock due to irregularity per Dr. Domenic Polite order,  EKG don @ 1026, 6301 (after 1st shock)  1029, 1032 (after 2nd shock) Patient did tolerate procedure well.   Lorna Few 12/03/2018, 11:44 AM

## 2018-12-04 DIAGNOSIS — R0902 Hypoxemia: Secondary | ICD-10-CM

## 2018-12-04 LAB — BASIC METABOLIC PANEL
Anion gap: 8 (ref 5–15)
BUN: 40 mg/dL — ABNORMAL HIGH (ref 8–23)
CO2: 32 mmol/L (ref 22–32)
Calcium: 8.8 mg/dL — ABNORMAL LOW (ref 8.9–10.3)
Chloride: 102 mmol/L (ref 98–111)
Creatinine, Ser: 1.6 mg/dL — ABNORMAL HIGH (ref 0.44–1.00)
GFR calc Af Amer: 39 mL/min — ABNORMAL LOW (ref >60)
GFR calc non Af Amer: 33 mL/min — ABNORMAL LOW (ref >60)
Glucose, Bld: 181 mg/dL — ABNORMAL HIGH (ref 70–99)
Potassium: 3.9 mmol/L (ref 3.5–5.1)
Sodium: 142 mmol/L (ref 135–145)

## 2018-12-04 LAB — HIV ANTIBODY (ROUTINE TESTING W REFLEX): HIV Screen 4th Generation wRfx: REACTIVE — AB

## 2018-12-04 LAB — HIV 1/2 AB DIFFERENTIATION
HIV 1 Ab: NEGATIVE
HIV 2 Ab: NEGATIVE
Note: NEGATIVE

## 2018-12-04 LAB — CBC
HCT: 27.7 % — ABNORMAL LOW (ref 36.0–46.0)
Hemoglobin: 7.8 g/dL — ABNORMAL LOW (ref 12.0–15.0)
MCH: 22 pg — ABNORMAL LOW (ref 26.0–34.0)
MCHC: 28.2 g/dL — ABNORMAL LOW (ref 30.0–36.0)
MCV: 78.2 fL — ABNORMAL LOW (ref 80.0–100.0)
Platelets: 125 10*3/uL — ABNORMAL LOW (ref 150–400)
RBC: 3.54 MIL/uL — ABNORMAL LOW (ref 3.87–5.11)
RDW: 18.1 % — ABNORMAL HIGH (ref 11.5–15.5)
WBC: 3.9 10*3/uL — ABNORMAL LOW (ref 4.0–10.5)
nRBC: 0 % (ref 0.0–0.2)

## 2018-12-04 LAB — GLUCOSE, CAPILLARY
Glucose-Capillary: 193 mg/dL — ABNORMAL HIGH (ref 70–99)
Glucose-Capillary: 185 mg/dL — ABNORMAL HIGH (ref 70–99)
Glucose-Capillary: 201 mg/dL — ABNORMAL HIGH (ref 70–99)
Glucose-Capillary: 194 mg/dL — ABNORMAL HIGH (ref 70–99)

## 2018-12-04 LAB — RNA QUALITATIVE: HIV 1 RNA Qualitative: 1

## 2018-12-04 LAB — MAGNESIUM: Magnesium: 1.8 mg/dL (ref 1.7–2.4)

## 2018-12-04 MED ORDER — METOPROLOL SUCCINATE ER 50 MG PO TB24
50.0000 mg | ORAL_TABLET | Freq: Every day | ORAL | Status: DC
  Administered 2018-12-05: 50 mg via ORAL
  Filled 2018-12-04: qty 1

## 2018-12-04 MED ORDER — POTASSIUM CHLORIDE CRYS ER 20 MEQ PO TBCR
20.0000 meq | EXTENDED_RELEASE_TABLET | Freq: Every day | ORAL | Status: DC
  Administered 2018-12-04 – 2018-12-05 (×2): 20 meq via ORAL
  Filled 2018-12-04 (×2): qty 1

## 2018-12-04 MED ORDER — ATORVASTATIN CALCIUM 20 MG PO TABS
20.0000 mg | ORAL_TABLET | Freq: Every day | ORAL | Status: DC
  Administered 2018-12-04: 20 mg via ORAL
  Filled 2018-12-04: qty 1

## 2018-12-04 MED ORDER — TORSEMIDE 20 MG PO TABS
20.0000 mg | ORAL_TABLET | Freq: Two times a day (BID) | ORAL | Status: DC
  Administered 2018-12-04 – 2018-12-05 (×3): 20 mg via ORAL
  Filled 2018-12-04 (×3): qty 1

## 2018-12-04 NOTE — Progress Notes (Signed)
Progress Note  Patient Name: Erica [name] Date of Encounter: 12/04/2018  Primary Cardiologist: Kate Sable, MD  Subjective   Shortness of breath has improved during stay, no sense of palpitations or chest pain.  Leg swelling improving as well.  She has not moved around very much however.  Inpatient Medications    Scheduled Meds: . amiodarone  400 mg Oral BID  . apixaban  5 mg Oral BID  . cholecalciferol  1,000 Units Oral Daily  . colestipol  1 g Oral Daily  . insulin aspart  0-5 Units Subcutaneous QHS  . insulin aspart  0-9 Units Subcutaneous TID WC  . insulin aspart protamine- aspart  8 Units Subcutaneous BID WC  . latanoprost  1 drop Both Eyes QHS  . magnesium oxide  400 mg Oral Daily  . [START ON 12/05/2018] metoprolol succinate  50 mg Oral Daily  . multivitamin with minerals  1 tablet Oral Daily  . pantoprazole  40 mg Oral Daily  . polyethylene glycol  17 g Oral Daily  . potassium chloride  20 mEq Oral Daily  . simvastatin  40 mg Oral QPM  . sodium chloride flush  3 mL Intravenous Q12H  . sodium chloride flush  3 mL Intravenous Q12H  . tiZANidine  4 mg Oral QHS  . torsemide  20 mg Oral BID   Continuous Infusions: . sodium chloride    . sodium chloride     PRN Meds: sodium chloride, acetaminophen **OR** acetaminophen, nitroGLYCERIN, ondansetron **OR** ondansetron (ZOFRAN) IV, sodium chloride, sodium chloride flush, sodium chloride flush   Vital Signs    Vitals:   12/03/18 1406 12/03/18 2145 12/04/18 0545 12/04/18 0548  BP: 108/63 115/64 (!) 96/42 95/65  Pulse: 89 92 84 83  Resp: 20 (!) 24 (!) 24   Temp: 97.6 F (36.4 C) 98.1 F (36.7 C) 98.2 F (36.8 C)   TempSrc: Oral Oral Oral   SpO2: 93% 99% 99% 98%  Weight:   110 kg   Height:        Intake/Output Summary (Last 24 hours) at 12/04/2018 0907 Last data filed at 12/04/2018 0804 Gross per 24 hour  Intake 1310 ml  Output 1751 ml  Net -441 ml   Filed Weights   12/03/18 0500 12/03/18 0913  12/04/18 0545  Weight: 109.2 kg 109.2 kg 110 kg    Telemetry    Atrial fibrillation.  Personally reviewed.  ECG    Tracing from 12/04/2018 shows atrial fibrillation with leftward axis and nonspecific ST-T changes.  Personally reviewed.  Physical Exam   GEN: No acute distress.   Neck: No JVD. Cardiac:  Irregularly irregular, no gallop.  Respiratory:  Diminished breath sounds at the bases, no wheezing. GI: Soft, nontender, bowel sounds present. MS:  Improved, chronic appearing leg edema, right greater than left. Neuro:  Nonfocal. Psych: Alert and oriented x 3. Normal affect.  Labs    Chemistry Recent Labs  Lab 12/02/18 0601 12/03/18 0600 12/04/18 0515  NA 139 140 142  K 3.9 3.7 3.9  CL 103 103 102  CO2 25 28 32  GLUCOSE 166* 160* 181*  BUN 49* 41* 40*  CREATININE 1.86* 1.54* 1.60*  CALCIUM 8.7* 8.8* 8.8*  GFRNONAA 28* 35* 33*  GFRAA 32* 40* 39*  ANIONGAP 11 9 8      Hematology Recent Labs  Lab 12/01/18 0608 12/03/18 0600 12/04/18 0515  WBC 4.3 4.1 3.9*  RBC 3.66* 3.76* 3.54*  HGB 8.3* 8.5* 7.8*  HCT 28.9* 29.5*  27.7*  MCV 79.0* 78.5* 78.2*  MCH 22.7* 22.6* 22.0*  MCHC 28.7* 28.8* 28.2*  RDW 18.4* 18.2* 18.1*  PLT 124* 112* 125*    Radiology    No results found.  Cardiac Studies   Echocardiogram 09/30/2018([facility]): Severe reduced LVEF of 25% with hypokinesis to akinesis of the mid to apical anterior and anteroseptal walls and apical dyskinesis, diastolic function not assessed, mitral annular calcification with mild mitral regurgitation, moderate left atrial enlargement, mild aortic regurgitation, mild pulmonic regurgitation, mildly dilated right ventricle with mildly decreased contraction, mild tricuspid regurgitation, estimated PASP 40 mmHg.  Patient Profile     68 y.o. female with a history of CAD status post CABG in 2014, carotid artery disease, persistent atrial fibrillation, hypertension, hyperlipidemia, type 2 diabetes mellitus, CKD stage  III, previous stroke, and ischemic cardiomyopathy with LVEF 25%. She presents with acute on chronic combined heart failure in the setting of acute on chronic renal failure.  Assessment & Plan    1.  Persistent atrial fibrillation.  Patient underwent electrical cardioversion yesterday with return to sinus rhythm, however this was limited to the span of about 1 to 2 hours.  She had frequent atrial ectopy and ultimately returned to atrial fibrillation.  2.  Acute on chronic combined heart failure.  She has diuresed nearly 7 L on IV Lasix and renal function has returned to prior baseline.  3.  Ischemic cardiomyopathy with LVEF approximately 25% by echocardiogram in February.  She is on Toprol-XL, not on ACE inhibitor/ARB/ANRI/Aldactone with CKD stage III.   Current blood pressure limits addition of hydralazine/nitrate combination.  4.  Acute on chronic renal failure with CKD stage III at baseline.  Creatinine has returned to baseline at 1.6.  5.  Essential hypertension, blood pressure low normal range.  I discussed the overall situation with the patient.  Plan is to initiate oral amiodarone load with consideration to a repeat cardioversion attempt as an outpatient.  Increase Toprol-XL to 50 mg daily.  Transition back to Demadex 20 mg twice daily with KCl 20 mEq daily.  Current blood pressure limits addition of hydralazine/nitrate combination.  Continue Eliquis for stroke prophylaxis.  Need to increase activity today for reassessment in terms of potential discharge in the next 24 hours.  She will need to have close outpatient follow-up, telehealth visit can be arranged with Dr. Bronson Ing for 1 week after discharge.  Signed, Rozann Lesches, MD  12/04/2018, 9:07 AM

## 2018-12-04 NOTE — Evaluation (Addendum)
Physical Therapy Evaluation Patient Details Name: Erica [name] MRN: [id number] DOB: 31-Aug-1950 Today's Date: 12/04/2018   History of Present Illness  Erica [name] is a 68 y.o. 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  She was on the phone with her cardiology office who had been asking her to come to the hospital for admission and diuresis and she was very audibly short of breath.  She is noted to have worsening lower extremity edema, but states that her weight has remained stable at home at approximately 256 pounds.  She initially had her home torsemide dose increased from 20 mg twice daily to 40 mg twice daily and metolazone was added, but her creatinine worsened at that time to 3.52.  Her baseline is usually around 1.7.    Clinical Impression  Patient presents in bathroom on commode with c/o nose bleed, plugged patient's left nostril and bleeding subsequently stopped.  Patient demonstrates slow labored movement and required use of grab bar to stand up from commode and demonstrates slow labored cadence without loss of balance during ambulation in room and hallway.  Patient required assistance to move legs onto bed during bed mobility, but states she sleeps in a recliner chair at home.  Patient demonstrates excessive external rotation of RLE during ambulation in hallway and has to wear right AFO due to foot drop.  Patient tolerated sitting up in chair after therapy with nursing staff present in room.  Patient will benefit from continued physical therapy in hospital and recommended venue below to increase strength, balance, endurance for safe ADLs and gait.  Patient's  SpO2 desaturated with exertion below 80% on room air and required 3 LPM throughout therapy with SpO2 between 88-92% with exertion.    Follow Up Recommendations Home health PT;Supervision for mobility/OOB;Supervision/Assistance - 24 hour    Equipment Recommendations  None recommended by PT    Recommendations for Other Services       Precautions / Restrictions Precautions Precautions: Fall Restrictions Weight Bearing Restrictions: No      Mobility  Bed Mobility Overal bed mobility: Needs Assistance Bed Mobility: Supine to Sit;Sit to Supine     Supine to sit: Min assist Sit to supine: Supervision   General bed mobility comments: requires help to move legs onto bed, patient usually sleeps in a recliner  Transfers Overall transfer level: Needs assistance Equipment used: Straight cane Transfers: Sit to/from Stand;Stand Pivot Transfers Sit to Stand: Min guard;Min assist Stand pivot transfers: Supervision;Min guard       General transfer comment: has difficulty with sit to stands due to BLE weakness  Ambulation/Gait Ambulation/Gait assistance: Supervision;Min guard Gait Distance (Feet): 60 Feet Assistive device: Straight cane Gait Pattern/deviations: Decreased step length - right;Decreased stance time - right;Decreased stride length;Decreased dorsiflexion - right;Step-to pattern Gait velocity: decreased   General Gait Details: demonstrates slow labored movement with excessive external rotation RLE, has to wear right AFO due to drop foot, no loss of balance with mostly step-to gait pattern using SPC  Stairs            Wheelchair Mobility    Modified Rankin (Stroke Patients Only)       Balance Overall balance assessment: Needs assistance Sitting-balance support: Feet supported;No upper  extremity supported Sitting balance-Leahy Scale: Good     Standing balance support: Single extremity supported;During functional activity Standing balance-Leahy Scale:  Fair Standing balance comment: using SPC                             Pertinent Vitals/Pain Pain Assessment: No/denies pain    Home Living Family/patient expects to be discharged to:: Private residence Living Arrangements: Alone Available Help at Discharge: Family Type of Home: Mobile home Home Access: Stairs to enter Entrance Stairs-Rails: None Entrance Stairs-Number of Steps: 3 Home Layout: One level Home Equipment: Cane - single point;Other (comment) Additional Comments: hemi-walker, right AFO    Prior Function Level of Independence: Independent with assistive device(s)         Comments: Household and short community distanced ambulator with SPC, drives     Hand Dominance   Dominant Hand: Left    Extremity/Trunk Assessment   Upper Extremity Assessment Upper Extremity Assessment: Generalized weakness;RUE deficits/detail;LUE deficits/detail RUE Deficits / Details: grossly -3/5 except hand grip strength 2+/5, limited use since CVA LUE Deficits / Details: grossly 4+/5    Lower Extremity Assessment Lower Extremity Assessment: Generalized weakness;RLE deficits/detail;LLE deficits/detail RLE Deficits / Details: grossly 3+/5 except right ankle dorsiflexion 0/5, has to wear AFO for walking LLE Deficits / Details: grossly 4+/5    Cervical / Trunk Assessment Cervical / Trunk Assessment: Normal  Communication   Communication: No difficulties  Cognition Arousal/Alertness: Awake/alert Behavior During Therapy: WFL for tasks assessed/performed Overall Cognitive Status: Within Functional Limits for tasks assessed                                        General Comments      Exercises     Assessment/Plan    PT Assessment Patient needs continued PT services  PT Problem List Decreased strength;Decreased activity tolerance;Decreased balance;Decreased mobility       PT Treatment Interventions Therapeutic exercise;Gait training;Stair  training;Functional mobility training;Therapeutic activities;Patient/family education    PT Goals (Current goals can be found in the Care Plan section)  Acute Rehab PT Goals Patient Stated Goal: return home with family to assist PT Goal Formulation: With patient Time For Goal Achievement: 12/11/18 Potential to Achieve Goals: Good    Frequency Min 3X/week   Barriers to discharge        Co-evaluation               AM-PAC PT "6 Clicks" Mobility  Outcome Measure Help needed turning from your back to your side while in a flat bed without using bedrails?: A Little Help needed moving from lying on your back to sitting on the side of a flat bed without using bedrails?: A Little Help needed moving to and from a bed to a chair (including a wheelchair)?: A Little Help needed standing up from a chair using your arms (e.g., wheelchair or bedside chair)?: A Little Help needed to walk in hospital room?: A Little Help needed climbing 3-5 steps with a railing? : A Lot 6 Click Score: 17    End of Session Equipment Utilized During Treatment: Gait belt;Oxygen Activity Tolerance: Patient tolerated treatment well;Patient limited by fatigue Patient left: in chair;with call bell/phone within reach Nurse Communication: Mobility status PT Visit Diagnosis: Unsteadiness on feet (R26.81);Other abnormalities of gait and mobility (R26.89);Muscle weakness (generalized) (M62.81)    Time: 1607-3710 PT Time  Calculation (min) (ACUTE ONLY): 40 min   Charges:   PT Evaluation $PT Eval Moderate Complexity: 1 Mod PT Treatments $Therapeutic Activity: 23-37 mins        2:12 PM, 12/04/18 Lonell Grandchild, MPT Physical Therapist with [facility] [phone] office [phone] mobile phone

## 2018-12-04 NOTE — Plan of Care (Signed)
  Problem: Acute Rehab PT Goals(only PT should resolve) Goal: Pt Will Go Supine/Side To Sit Outcome: Progressing Flowsheets (Taken 12/04/2018 1416) Pt will go Supine/Side to Sit: with min guard assist Goal: Patient Will Transfer Sit To/From Stand Outcome: Progressing Flowsheets (Taken 12/04/2018 1416) Patient will transfer sit to/from stand: with supervision Goal: Pt Will Transfer Bed To Chair/Chair To Bed Outcome: Progressing Flowsheets (Taken 12/04/2018 1416) Pt will Transfer Bed to Chair/Chair to Bed: with supervision Goal: Pt Will Ambulate Outcome: Progressing Flowsheets (Taken 12/04/2018 1416) Pt will Ambulate: 75 feet; with cane; with min guard assist   2:17 PM, 12/04/18 Lonell Grandchild, MPT Physical Therapist with [facility] [phone] office [phone] mobile phone

## 2018-12-04 NOTE — Progress Notes (Signed)
PROGRESS NOTE    Erica [name]  XEN:[id number] DOB: 05-15-1951 DOA: 11/29/2018 PCP: Curlene Labrum, MD   Brief Narrative:  Per HPI: Erica Shore KNIGHTis a 68 y.o.femalewith medical history significant forCAD status post CABG in 2014, chronic combined systolic and diastolic CHF with LVEF 10% in 2020, persistent atrial fibrillation on Eliquis, hypertension, dyslipidemia, insulin-dependent diabetes, stage III CKD, prior CVA, and carotid artery stenosis who presented to the ED with progressive dyspnea on exertion with minimal activity noted. She was on the phone with her cardiology office who had been asking her to come to the hospital for admission and diuresis and she was very audibly short of breath. She is noted to have worsening lower extremity edema, but states that her weight has remained stable at home at approximately 256 pounds. She initially had her home torsemide dose increased from 20 mg twice daily to 40 mg twice daily and metolazone was added, but her creatinine worsened at that time to 3.52. Her baseline is usually around 1.7.  Patient was admitted with acute hypoxemic respiratory failure secondary to acute on chronic combined systolic and diastolic CHF.She appears to be feeling somewhat better this morning and has diuresed better on increased dose of Lasix with serum creatinine showing improvement as well.    4/28: Patient has undergone cardioversion this morning per cardiology and appears to have tolerated the procedure well and remained in sinus rhythm for a large majority of the time, but unfortunately seems to have gone back in atrial fibrillation for which Toprol-XL and amiodarone were initiated by cardiology.  Continue diuresis and follow-up ECG in a.m.  Assessment & Plan:   Active Problems:   Acute on chronic systolic (congestive) heart failure (HCC)   Persistent atrial fibrillation   1. Acute hypoxemic respiratory failure secondary to acute on chronic combined  systolic and diastolic CHF. Recent echocardiogram with LVEF 25% noted on 09/2018. BNP is noted to be 1482.Continues to have great urine output; since admission patient has diuresed nearly 7 L; renal function back to baseline and very close to euvolemic state.  Transition diuretics to oral regimen, continue daily weights, follow strict I's and O's and hopefully discharge soon with close outpatient follow-up by cardiology service.  Continue beta-blocker.  Unable to use ACE inhibitor/ARB/ANRI/Aldactone with chronic kidney disease stage III and low blood pressure limiting the addition of hydralazine/nitrate combination. 2. Persistent atrial fibrillation-rate controlled status post cardioversion.  Patient was in sinus rhythm briefly after the procedure, but has returned to atrial fibrillation late evening.  Cardiology has initiated Toprol-XL as well as amiodarone twice daily. Maintain on Eliquis 5 mg twice daily for secondary prevention.  Will follow any further cardiology recommendations. 3. CAD. Continue statin therapy. No signs of ischemia currently noted and no chest pain.  Continue monitoring on telemetry. 4. Hypertension.  Beta-blocker reinitiated.  Continue to hold ACE inhibitor.  Blood pressure is soft; but stable 5. AKI on CKD stage III-improving. Baseline creatinine 1.6-1.7.Will monitor and repeat labs and continue on strict I's and O's. Avoid further nephrotoxic agents.  6. Anemia. No overt bleeding identified. Will follow repeat CBC in a.m.FOBT negative. Continue on Eliquis. 7. Type 2 diabetes.Continue on SSI and start back on half dose of 70/30 insulin at home with noted increase in blood glucose levels now.   DVT prophylaxis:Eliquis Code Status:Full code Family Communication:Patient will call family Disposition Plan:Transition diuretics to oral regimen as per cardiology recommendations. Follow a.m. labs.   Continue Toprol and amiodarone.  Continue oral anticoagulation  for secondary prevention.  Follow daily weights, strict intake and output and hopefully discharge in the next 24-48 hours.   Consultants:  Cardiology  Procedures:  Cardioversion 4/28  Antimicrobials:   None  Subjective: No fever, no chest pain, no nausea, no vomiting.  Reports intermittent episodes of palpitations overnight.  Still requiring oxygen supplementation (2-3 L).  Objective: Vitals:   12/03/18 1406 12/03/18 2145 12/04/18 0545 12/04/18 0548  BP: 108/63 115/64 (!) 96/42 95/65  Pulse: 89 92 84 83  Resp: 20 (!) 24 (!) 24   Temp: 97.6 F (36.4 C) 98.1 F (36.7 C) 98.2 F (36.8 C)   TempSrc: Oral Oral Oral   SpO2: 93% 99% 99% 98%  Weight:   110 kg   Height:        Intake/Output Summary (Last 24 hours) at 12/04/2018 1727 Last data filed at 12/04/2018 1300 Gross per 24 hour  Intake 720 ml  Output 2050 ml  Net -1330 ml   Filed Weights   12/03/18 0500 12/03/18 0913 12/04/18 0545  Weight: 109.2 kg 109.2 kg 110 kg    Examination: General exam: Alert, awake, oriented x 3, still requiring 3 L of oxygen supplementation.  Denies chest pain, no nausea vomiting.  Patient reports some intermittent palpitations overnight. Respiratory system: Normal respiratory effort, fine crackles at the bases, no using accessory muscles. Cardiovascular system:Rate controlled currently; no rubs, no gallops, unable to properly assess JVD due to body habitus. Gastrointestinal system: Abdomen is obese, nondistended, soft and nontender. No organomegaly or masses felt. Normal bowel sounds heard. Central nervous system: Alert and oriented. No focal neurological deficits. Extremities: No cyanosis or clubbing; trace edema bilaterally appreciated on exam. Skin: No rashes, lesions or ulcers Psychiatry: Judgement and insight appear normal. Mood & affect appropriate.    Data Reviewed: I have personally reviewed following labs and imaging studies  CBC: Recent Labs  Lab 11/29/18 0915  11/30/18 0526 12/01/18 0608 12/03/18 0600 12/04/18 0515  WBC 6.0 4.9 4.3 4.1 3.9*  HGB 9.4* 8.5* 8.3* 8.5* 7.8*  HCT 32.4* 30.6* 28.9* 29.5* 27.7*  MCV 77.9* 79.7* 79.0* 78.5* 78.2*  PLT 177 151 124* 112* 144*   Basic Metabolic Panel: Recent Labs  Lab 11/29/18 0915 11/30/18 0526 12/01/18 0608 12/02/18 0601 12/03/18 0600 12/04/18 0515  NA 139 139 139 139 140 142  K 4.5 4.9 4.4 3.9 3.7 3.9  CL 107 108 107 103 103 102  CO2 19* 22 24 25 28  32  GLUCOSE 112* 119* 197* 166* 160* 181*  BUN 63* 61* 54* 49* 41* 40*  CREATININE 2.67* 2.63* 2.15* 1.86* 1.54* 1.60*  CALCIUM 9.1 8.7* 8.7* 8.7* 8.8* 8.8*  MG 2.1 2.0 1.9 1.9  --  1.8   GFR: Estimated Creatinine Clearance: 38.9 mL/min (A) (by C-G formula based on SCr of 1.6 mg/dL (H)).  CBG: Recent Labs  Lab 12/03/18 1654 12/03/18 2147 12/04/18 0725 12/04/18 1113 12/04/18 1605  GLUCAP 287* 200* 193* 185* 201*    Scheduled Meds:  amiodarone  400 mg Oral BID   apixaban  5 mg Oral BID   atorvastatin  20 mg Oral q1800   cholecalciferol  1,000 Units Oral Daily   colestipol  1 g Oral Daily   insulin aspart  0-5 Units Subcutaneous QHS   insulin aspart  0-9 Units Subcutaneous TID WC   insulin aspart protamine- aspart  8 Units Subcutaneous BID WC   latanoprost  1 drop Both Eyes QHS   magnesium oxide  400 mg Oral Daily   [  START ON 12/05/2018] metoprolol succinate  50 mg Oral Daily   multivitamin with minerals  1 tablet Oral Daily   pantoprazole  40 mg Oral Daily   polyethylene glycol  17 g Oral Daily   potassium chloride  20 mEq Oral Daily   sodium chloride flush  3 mL Intravenous Q12H   sodium chloride flush  3 mL Intravenous Q12H   tiZANidine  4 mg Oral QHS   torsemide  20 mg Oral BID   Continuous Infusions:  sodium chloride     sodium chloride       LOS: 5 days    Time spent: 30 minutes   Barton Dubois, MD [facility] Pager [phone] 12/04/2018, 5:27 PM

## 2018-12-05 ENCOUNTER — Encounter (HOSPITAL_COMMUNITY): Payer: Self-pay | Admitting: Cardiology

## 2018-12-05 DIAGNOSIS — R0902 Hypoxemia: Secondary | ICD-10-CM

## 2018-12-05 DIAGNOSIS — N179 Acute kidney failure, unspecified: Secondary | ICD-10-CM

## 2018-12-05 DIAGNOSIS — N183 Chronic kidney disease, stage 3 unspecified: Secondary | ICD-10-CM

## 2018-12-05 DIAGNOSIS — E1121 Type 2 diabetes mellitus with diabetic nephropathy: Secondary | ICD-10-CM

## 2018-12-05 LAB — BASIC METABOLIC PANEL
Anion gap: 8 (ref 5–15)
BUN: 40 mg/dL — ABNORMAL HIGH (ref 8–23)
CO2: 33 mmol/L — ABNORMAL HIGH (ref 22–32)
Calcium: 8.8 mg/dL — ABNORMAL LOW (ref 8.9–10.3)
Chloride: 101 mmol/L (ref 98–111)
Creatinine, Ser: 1.72 mg/dL — ABNORMAL HIGH (ref 0.44–1.00)
GFR calc Af Amer: 35 mL/min — ABNORMAL LOW (ref >60)
GFR calc non Af Amer: 30 mL/min — ABNORMAL LOW (ref >60)
Glucose, Bld: 148 mg/dL — ABNORMAL HIGH (ref 70–99)
Potassium: 4.3 mmol/L (ref 3.5–5.1)
Sodium: 142 mmol/L (ref 135–145)

## 2018-12-05 LAB — GLUCOSE, CAPILLARY
Glucose-Capillary: 151 mg/dL — ABNORMAL HIGH (ref 70–99)
Glucose-Capillary: 222 mg/dL — ABNORMAL HIGH (ref 70–99)

## 2018-12-05 MED ORDER — TORSEMIDE 20 MG PO TABS: 20.0000 mg | ORAL_TABLET | Freq: Two times a day (BID) | ORAL | 1 refills | Status: DC

## 2018-12-05 MED ORDER — AMIODARONE HCL 400 MG PO TABS: ORAL_TABLET | ORAL | 0 refills | Status: DC

## 2018-12-05 MED ORDER — ATORVASTATIN CALCIUM 20 MG PO TABS: 20.0000 mg | ORAL_TABLET | Freq: Every day | ORAL | 1 refills | Status: DC

## 2018-12-05 MED ORDER — POTASSIUM CHLORIDE CRYS ER 20 MEQ PO TBCR: 20.0000 meq | EXTENDED_RELEASE_TABLET | Freq: Every day | ORAL | 0 refills | Status: DC

## 2018-12-05 MED ORDER — LINAGLIPTIN 5 MG PO TABS: 5.0000 mg | ORAL_TABLET | Freq: Every day | ORAL | 1 refills | Status: DC

## 2018-12-05 NOTE — Care Management Important Message (Signed)
Important Message  Patient Details  Name: Erica [name] MRN: [id number] Date of Birth: 04-07-1951   Medicare Important Message Given:  Yes    Sherald Barge, RN 12/05/2018, 10:08 AM

## 2018-12-05 NOTE — Care Management (Signed)
Patient Information   Patient Name Erica [name], Erica [name] ([id number]) Sex Female DOB [birth year]  Room Bed  A338 A338-01  Patient Demographics   Address [place] [address] Phone [phone] (Home) E-mail Address [email or web address]  Patient Ethnicity & Race   Ethnic Group Patient Race  Not Hispanic or Latino White or Caucasian  Emergency Contact(s)   Name Relation Home Work [place] Daughter   [phone]  Erica [name], Erica [name] [phone]  [phone]  Documents on File    Status Date Received Description  Documents for the Patient  EMR Problem Summary Not Received    EMR Patient Summary Not Received    [place] E-Signature HIPAA Notice of Privacy Received 11/25/10   [place] E-Signature HIPAA Notice of Privacy Spanish Not Received    Driver's License Not Received    Advance Directives/Living Will/HCPOA/POA Not Received    [place] HIPAA NOTICE OF PRIVACY - Scanned Not Received    Driver's License Not Received    [place] Not Received    Insurance Card Received 10/14/14 UHC - [place] Application Not Received    [place] HIPAA NOTICE OF PRIVACY - Scanned Not Received    [place] HIPAA NOTICE OF PRIVACY - Scanned Received 03/15/12 HIAA SCANED  Release of Information Received 03/15/12 [employer] Card Received  old card  Insurance Card Received  old card  Release of Information Received 03/07/13 [place] - [facility] DPR  AMB Correspondence  03/26/13 05/14 referral Bur[place] MD, S  HIM ROI Authorization (Expired) 06/27/14 Authorization for batch ECS RMF 06/27/14 #2  Insurance Card     HIM ROI Authorization (Expired) 03/17/15 Authorization for batch CIOX/United[employer] Care     fbg     03/16/15  AMB Correspondence  03/17/15 HEMATOLOGY/ONCOLOGY OUTPATIENT  Insurance Card   AETNA HMO-referral req'd- [place] 2017  HIM ROI Authorization  07/20/16  [facility]-ROI  HIM ROI Authorization  07/21/16 [place] 2018  Insurance Card Received 10/05/17 Aetna [facility]  Other Photo ID Not Received    HIM ROI Authorization (Expired) 04/09/18 Authorization for batch AETNA/EPISource 2019 Medicare Chart Review  fbg  04/09/18   E-Signature HIPAA Notice of Privacy Signed 08/02/18   AMB Outside ED Note Received 10/17/18 DISCHARGE SUMMARY [place] Card Received 10/18/18 Aetna ./ Medicare 2020  AMB Outside Hospital Record Received 10/30/18 DISCHARGE SUMMARY REPORT [facility] HIPAA NOTICE OF PRIVACY - Scanned Received (Deleted) 11/25/10   Insurance Card (Deleted) 03/07/13   AMB Correspondence (Deleted) 03/26/13   HIM ROI Authorization (Deleted) 12/11/13   Other Photo ID Not Received (Deleted)    HIM ROI Authorization (Deleted) 02/11/15   HIM ROI Authorization (Deleted) 04/14/15   Documents for the Encounter  AOB (Assignment of Insurance Benefits) Not Received    E-signature AOB Signed 11/29/18   MEDICARE RIGHTS Not Received    E-signature Medicare Rights Signed 11/29/18   ED Patient Billing Extract   ED PB Billing Extract  Cardiac Monitoring Strip Received 11/29/18   Cardiac Monitoring Strip Shift Summary Received 11/29/18   HIM Release of Information Output   [id number]  EKG Received 12/02/18   Admission Information   Current Information   Attending Provider Admitting Provider Admission Type Admission Status  Barton Dubois, MD Rodena Goldmann, DO Emergency Admission (Confirmed)  Admission Date/Time Discharge Date Hospital Service Auth/Cert Status  00/71/21 08:50 AM  Internal Medicine Incomplete       Hospital Area Unit Room/Bed   [facility] AP-DEPT 300 A338/A338-01        Admission   Complaint  [facility] [place] [facility] Account   Name Acct ID Class Status Primary Coverage   Erica [name], Erica [name] [id number] Inpatient Open [place]      Guarantor Account (for Hospital Account [id number])   Name Relation to Pt Service Area Active? Acct Type  Erica [name] Self CHSA Yes Personal/Family  [place] [address] [phone])        Coverage Information (for Hospital Account [id number])   F/O Payor/Plan Precert #  AETNA MEDICARE/AETNA MEDICARE HMO/PPO   Subscriber Subscriber #  Erica [name], Erica [name] [facility]  Address [place]  [address] [place] [place] [address] [phone]       Care Everywhere ID:  [id number]

## 2018-12-05 NOTE — Discharge Summary (Signed)
Physician Discharge Summary  Erica [name] [place] WUJ:[id number] DOB: Mar 24, 1951 DOA: 11/29/2018  PCP: Curlene Labrum, MD  Admit date: 11/29/2018 Discharge date: 12/05/2018  Time spent: 35 minutes  Recommendations for Outpatient Follow-up:  1. Repeat CBC to follow hemoglobin trend 2. Repeat basic metabolic panel to follow electrolytes and renal function 3. Close follow-up the patient CBGs with further adjustment to hypoglycemic regimen as needed 4. Reassess vital signs and further adjust antihypertensive medications as required.   Discharge Diagnoses:  Active Problems:   Type 2 diabetes with nephropathy (HCC)   Obesity, Class III, BMI 40-49.9 (morbid obesity) (HCC)   Acute on chronic systolic (congestive) heart failure (HCC)   Persistent atrial fibrillation   Hypoxia   Acute renal failure superimposed on stage 3 chronic kidney disease ([place])   Discharge Condition: Stable and improved overall.  Discharged home with oxygen supplementation.  Close outpatient follow-up with cardiology service has been arranged and the patient has been requested to arrange follow-up with PCP in 2 weeks.  Diet recommendation: Heart healthy and modify carbohydrates.  Filed Weights   12/03/18 0500 12/03/18 0913 12/04/18 0545  Weight: 109.2 kg 109.2 kg 110 kg    History of present illness:  As per H&P written by Dr. Manuella Ghazi on 11/29/2018 68 y.o. 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  She was on the phone with her cardiology office who had been asking her to come to the hospital for admission and diuresis and she was very audibly short of breath.  She is noted to have worsening  lower extremity edema, but states that her weight has remained stable at home at approximately 256 pounds.  She initially had her home torsemide dose increased from 20 mg twice daily to 40 mg twice daily and metolazone was added, but her creatinine worsened at that time to 3.52.  Her baseline is usually around 1.7.   ED Course: Vital signs are currently stable, but patient is hypoxemic and requires 2.5 L nasal cannula.  She is in no acute respiratory distress and heart rate remains between 80 to 90 bpm with atrial fibrillation.  She has been given 40 mg of IV Lasix with no urination noted thus far.  Creatinine here is noted to be 2.67.  1 view chest x-ray with poor inspiratory effort noted and some cardiomegaly.  She has been assessed by cardiology with plans for diuresis using Lasix IV 40 mg twice daily and possible direct-current cardioversion early next week as needed.  Hospital Course:  1. Acute hypoxemic respiratory failure secondary to acute on chronic combined systolic and diastolic CHF. Recent echocardiogram with LVEF 25% noted on 09/2018. BNP is noted to be 1482.Continues to have great urine output; since admission patient has diuresed nearly 8 L; renal function back to baseline and very close to euvolemic state.  Transition diuretics to oral regimen, continue daily weights and follow low-sodium diet. Close outpatient follow-up by cardiology service.  Will continue beta-blocker.  Unable to use ACE inhibitor/ARB/ANRI/Aldactone with chronic kidney disease stage III and low blood pressure limiting the addition of hydralazine/nitrate combination.  Discharged on 3 L of oxygen supplementation. 2. Persistent atrial fibrillation-rate controlled status post cardioversion. Patient was in sinus rhythm briefly after the procedure,  but has returned to atrial fibrillation late evening.  Cardiology has initiated Toprol-XL as well as amiodarone twice daily for 1 week; with intention to then take 400 mg daily after  that. Maintain on Eliquis 5 mg twice daily for secondary prevention.    Outpatient follow-up with cardiology service. 3. CAD. Continue statin and beta-blocker therapy. No signs of ischemia currently noted and no chest pain. 4. Hypertension.  Beta-blocker reinitiated prior to discharge.  Continue to holding ACE inhibitor.  Blood pressure is soft; but stable overall.  Patient will be also using Demadex twice a day. 5. AKI on CKD stage III-improved and back to baseline. Will recommend repeat basic metabolic panel follow-up visit to reassess renal function and electrolytes stability. 6. Anemia. No overt bleeding identified. Will recommend repeat CBC at follow-up visit to reassess hemoglobin trend. FOBT negative. Continue on Eliquis. 7. Type 2 diabetes.Continue home hypoglycemic regimen using 7030; discontinue metformin due to worsening renal function and discharge on Tradjenta.  Close follow-up with PCP to further adjust medications as needed based on her CBGs fluctuation. 8. Morbid obesity: Body mass index is 47.36 kg/m.;  Low calorie diet and portion control has been discussed with patient.   Procedures:  See below for x-ray reports  Attempted cardioversion 12/03/2018  Consultations:  Cardiology service  Discharge Exam: Vitals:   12/05/18 0532 12/05/18 0807  BP: (!) 93/54   Pulse: 70   Resp: 20   Temp: 97.7 F (36.5 C)   SpO2: 99% 99%    General: Afebrile, no chest pain, no nausea, no vomiting.  Reports significant improvement in her breathing and denies palpitations.  Demonstrated to require 3 L nasal cannula supplementation and will be discharged home with oxygen arrangements. Cardiovascular: Rate controlled, no rubs, no gallops, unable to properly assess JVD with body habitus. Respiratory: Improved air movement bilaterally, no wheezing, no crackles, no using accessory muscles. Abdomen: Soft, obese, nontender, positive bowel sounds. Extremities: 1+ edema bilaterally, no  cyanosis, no clubbing.  Patient reported leg swelling back to her baseline.  Discharge Instructions   Discharge Instructions    (HEART FAILURE PATIENTS) Call MD:  Anytime you have any of the following symptoms: 1) 3 pound weight gain in 24 hours or 5 pounds in 1 week 2) shortness of breath, with or without a dry hacking cough 3) swelling in the hands, feet or stomach 4) if you have to sleep on extra pillows at night in order to breathe.   Complete by:  As directed    Diet - low sodium heart healthy   Complete by:  As directed    Discharge instructions   Complete by:  As directed    Follow heart healthy diet (less than 2 g of sodium daily) Check your weight on daily basis Maintain adequate hydration Follow-up with PCP in 2 weeks Follow-up with cardiology service as instructed Take medications as prescribed and follow recommendations by home health services staff in order to assist with home adaptation, symptom management and rehabilitation.   Increase activity slowly   Complete by:  As directed      Allergies as of 12/05/2018      Reactions   Bee Venom    Codeine Nausea Only, Other (See Comments)   Other reaction(s): Other (See Comments) headache      Medication List    STOP taking these medications   metFORMIN 1000 MG tablet Commonly known as:  GLUCOPHAGE   simvastatin 40 MG tablet Commonly known as:  ZOCOR  TAKE these medications   amiodarone 400 MG tablet Commonly known as:  PACERONE 400mg  BID for 1 week; then 400mg  daily.   atorvastatin 20 MG tablet Commonly known as:  LIPITOR Take 1 tablet (20 mg total) by mouth daily at 6 PM.   cholecalciferol 1000 units tablet Commonly known as:  VITAMIN D Take 1,000 Units by mouth daily.   colestipol 1 g tablet Commonly known as:  COLESTID TAKE 1 TABLET BY MOUTH 30 MINUTES BEFORE BREAKFAST AND SUPPER What changed:  See the new instructions.   Eliquis 5 MG Tabs tablet Generic drug:  apixaban Take 5 mg by mouth 2  (two) times daily.   insulin NPH-regular Human (70-30) 100 UNIT/ML injection Inject 15-20 Units into the skin See admin instructions. Inject 20 units every morning and 15 units nightly.   linagliptin 5 MG Tabs tablet Commonly known as:  Tradjenta Take 1 tablet (5 mg total) by mouth daily.   Magnesium 400 MG Caps Take 1 capsule by mouth daily.   metoprolol succinate 50 MG 24 hr tablet Commonly known as:  TOPROL-XL Take 1 tablet (50 mg total) by mouth daily. Take with or immediately following a meal.   multivitamin tablet Take 1 tablet by mouth daily.   nitroGLYCERIN 0.4 MG SL tablet Commonly known as:  Nitrostat Place 1 tablet (0.4 mg total) under the tongue every 5 (five) minutes as needed for chest pain.   omeprazole 20 MG capsule Commonly known as:  PRILOSEC TAKE 1 CAPSULE BY MOUTH EVERY DAY What changed:  how much to take   potassium chloride SA 20 MEQ tablet Commonly known as:  K-DUR Take 1 tablet (20 mEq total) by mouth daily. Start taking on:  Dec 06, 2018   tiZANidine 4 MG tablet Commonly known as:  ZANAFLEX Take 4 mg by mouth at bedtime.   torsemide 20 MG tablet Commonly known as:  DEMADEX Take 1 tablet (20 mg total) by mouth 2 (two) times daily.   travoprost (benzalkonium) 0.004 % ophthalmic solution Commonly known as:  TRAVATAN Place 1 drop into both eyes at bedtime.            Durable Medical Equipment  (From admission, onward)         Start     Ordered   12/05/18 1004  For home use only DME oxygen  Once    Comments:  Deliver portable oxygen concentrator  Question Answer Comment  Mode or (Route) Nasal cannula   Liters per Minute 3   Frequency Continuous (stationary and portable oxygen unit needed)   Oxygen conserving device Yes   Oxygen delivery system Gas      12/05/18 1003         Allergies  Allergen Reactions  . Bee Venom   . Codeine Nausea Only and Other (See Comments)    Other reaction(s): Other (See Comments) headache    Follow-up Information    Herminio Commons, MD Follow up on 12/10/2018.   Specialty:  Cardiology Why:  Telehealth Phone Visit with Dr. Bronson Ing on 12/10/2018 at 12:00.  Contact information: [place] [address] [phone]        [facility] Follow up.   Why:  office out of [place]; phone number [phone]; they will cal you to make first appointment          The results of significant diagnostics from this hospitalization (including imaging, microbiology, ancillary and laboratory) are listed below for reference.  Significant Diagnostic Studies: Dg Chest Portable 1 View  Result Date: 11/29/2018 CLINICAL DATA:  Increasing shortness of breath EXAM: PORTABLE CHEST 1 VIEW COMPARISON:  09/28/2018 FINDINGS: Cardiac shadow remains enlarged. Postsurgical changes are noted. Poor inspiratory effort is noted. No focal infiltrate or sizable effusion is seen. No bony abnormality is noted. IMPRESSION: Poor inspiratory effort without focal abnormality. Electronically Signed   By: Inez Catalina M.D.   On: 11/29/2018 09:37   Labs: Basic Metabolic Panel: Recent Labs  Lab 11/29/18 0915 11/30/18 0526 12/01/18 0608 12/02/18 0601 12/03/18 0600 12/04/18 0515 12/05/18 0458  NA 139 139 139 139 140 142 142  K 4.5 4.9 4.4 3.9 3.7 3.9 4.3  CL 107 108 107 103 103 102 101  CO2 19* 22 24 25 28  32 33*  GLUCOSE 112* 119* 197* 166* 160* 181* 148*  BUN 63* 61* 54* 49* 41* 40* 40*  CREATININE 2.67* 2.63* 2.15* 1.86* 1.54* 1.60* 1.72*  CALCIUM 9.1 8.7* 8.7* 8.7* 8.8* 8.8* 8.8*  MG 2.1 2.0 1.9 1.9  --  1.8  --    CBC: Recent Labs  Lab 11/29/18 0915 11/30/18 0526 12/01/18 0608 12/03/18 0600 12/04/18 0515  WBC 6.0 4.9 4.3 4.1 3.9*  HGB 9.4* 8.5* 8.3* 8.5* 7.8*  HCT 32.4* 30.6* 28.9* 29.5* 27.7*  MCV 77.9* 79.7* 79.0* 78.5* 78.2*  PLT 177 151 124* 112* 125*   BNP (last 3 results) Recent Labs    11/29/18 0915  BNP 1,482.0*   CBG: Recent Labs  Lab  12/04/18 1113 12/04/18 1605 12/04/18 2201 12/05/18 0800 12/05/18 1116  GLUCAP 185* 201* 194* 151* 222*    Signed:  Barton Dubois MD.  [facility] 12/05/2018, 12:50 PM

## 2018-12-05 NOTE — Progress Notes (Signed)
Progress Note  Patient Name: Erica [name] Date of Encounter: 12/05/2018  Primary Cardiologist: Kate Sable, MD  Subjective   Ate breakfast this morning.  Preparing to work with PT.  She does not report any palpitations or chest pain.  Leg swelling generally improved to baseline.  Inpatient Medications    Scheduled Meds: . amiodarone  400 mg Oral BID  . apixaban  5 mg Oral BID  . atorvastatin  20 mg Oral q1800  . cholecalciferol  1,000 Units Oral Daily  . colestipol  1 g Oral Daily  . insulin aspart  0-5 Units Subcutaneous QHS  . insulin aspart  0-9 Units Subcutaneous TID WC  . insulin aspart protamine- aspart  8 Units Subcutaneous BID WC  . latanoprost  1 drop Both Eyes QHS  . magnesium oxide  400 mg Oral Daily  . metoprolol succinate  50 mg Oral Daily  . multivitamin with minerals  1 tablet Oral Daily  . pantoprazole  40 mg Oral Daily  . polyethylene glycol  17 g Oral Daily  . potassium chloride  20 mEq Oral Daily  . sodium chloride flush  3 mL Intravenous Q12H  . sodium chloride flush  3 mL Intravenous Q12H  . tiZANidine  4 mg Oral QHS  . torsemide  20 mg Oral BID   Continuous Infusions: . sodium chloride    . sodium chloride     PRN Meds: sodium chloride, acetaminophen **OR** acetaminophen, nitroGLYCERIN, ondansetron **OR** ondansetron (ZOFRAN) IV, sodium chloride, sodium chloride flush, sodium chloride flush   Vital Signs    Vitals:   12/04/18 2200 12/04/18 2230 12/05/18 0532 12/05/18 0807  BP: (!) 85/32 (!) 93/47 (!) 93/54   Pulse: 62 63 70   Resp: 20  20   Temp: (!) 97.5 F (36.4 C)  97.7 F (36.5 C)   TempSrc: Oral  Oral   SpO2: 98%  99% 99%  Weight:      Height:        Intake/Output Summary (Last 24 hours) at 12/05/2018 0836 Last data filed at 12/05/2018 0700 Gross per 24 hour  Intake 480 ml  Output 1650 ml  Net -1170 ml   Filed Weights   12/03/18 0500 12/03/18 0913 12/04/18 0545  Weight: 109.2 kg 109.2 kg 110 kg    Telemetry     Persistent, rate controlled atrial fibrillation.  Personally reviewed.  ECG    Tracing from 12/05/2018 shows rate controlled atrial fibrillation with poor anterior R wave progression, leftward axis, low voltage.  Personally reviewed.  Physical Exam   GEN: No acute distress.   Neck: No JVD. Cardiac:  Irregularly irregular, no gallop.  Respiratory:  Decreased breath sounds, no wheezing. GI: Soft, nontender, bowel sounds present. MS:  Improved, chronic appearing leg edema, right greater than left; No deformity. Neuro:  Nonfocal. Psych: Alert and oriented x 3. Normal affect.  Labs    Chemistry Recent Labs  Lab 12/03/18 0600 12/04/18 0515 12/05/18 0458  NA 140 142 142  K 3.7 3.9 4.3  CL 103 102 101  CO2 28 32 33*  GLUCOSE 160* 181* 148*  BUN 41* 40* 40*  CREATININE 1.54* 1.60* 1.72*  CALCIUM 8.8* 8.8* 8.8*  GFRNONAA 35* 33* 30*  GFRAA 40* 39* 35*  ANIONGAP 9 8 8      Hematology Recent Labs  Lab 12/01/18 0608 12/03/18 0600 12/04/18 0515  WBC 4.3 4.1 3.9*  RBC 3.66* 3.76* 3.54*  HGB 8.3* 8.5* 7.8*  HCT 28.9* 29.5* 27.7*  MCV 79.0* 78.5* 78.2*  MCH 22.7* 22.6* 22.0*  MCHC 28.7* 28.8* 28.2*  RDW 18.4* 18.2* 18.1*  PLT 124* 112* 125*    BNP Recent Labs  Lab 11/29/18 0915  BNP 1,482.0*     Radiology    No results found.  Cardiac Studies   Echocardiogram 09/30/2018([facility]): Severe reduced LVEF of 25% with hypokinesis to akinesis of the mid to apical anterior and anteroseptal walls and apical dyskinesis, diastolic function not assessed, mitral annular calcification with mild mitral regurgitation, moderate left atrial enlargement, mild aortic regurgitation, mild pulmonic regurgitation, mildly dilated right ventricle with mildly decreased contraction, mild tricuspid regurgitation, estimated PASP 40 mmHg.  Patient Profile     68 y.o. female with a historyof CAD status post CABG in 2014, carotid artery disease, persistent atrial fibrillation, hypertension,  hyperlipidemia, type 2 diabetes mellitus, CKD stage III, previous stroke, and ischemic cardiomyopathy with LVEF 25%. She presents with acute on chronic combined heart failure in the setting of acute on chronic renal failure.  Assessment & Plan    1.  Persistent atrial fibrillation.  Patient was briefly in sinus rhythm after electrical cardioversion however has maintained atrial fibrillation since that time.  She is currently on Toprol-XL, loading dose amiodarone, and Eliquis.  Would consider another attempt at cardioversion as an outpatient after she has loaded on amiodarone.  2.  Acute on chronic combined heart failure.  She has diuresed greater than 7 L during hospital stay.  Degree of renal dysfunction is at prior baseline.  She has been transitioned back to [facility].  3.  Ischemic cardiomyopathy with LVEF approximately 25% by echocardiogram in February.  She is on Toprol-XL.  She is not on ACE inhibitor/ARB/ANRI/Aldactone with CKD stage III.  Current blood pressure limits addition of hydralazine/nitrate combination.  4.  Acute on chronic renal failure with CKD stage III at baseline.  Creatinine 1.6-1.7 range now.  5.  Essential hypertension, blood pressure low normal to mildly low at this time.  [facility] will sign off.  Anticipate discharge home today. Medication Recommendations: Amiodarone 200 mg twice daily for 1 week with reduce to once daily thereafter, Eliquis 5 mg twice daily, Lipitor 20 mg daily, Toprol-XL 50 mg daily, Demadex 20 mg twice daily, KCl 20 mEq daily Other recommendations (labs, testing, etc): No further cardiac testing planned at this time Follow up as an outpatient: Keep follow-up scheduled telehealth visit with Dr. Bronson Ing on May 5  Signed, Rozann Lesches, MD  12/05/2018, 8:36 AM

## 2018-12-05 NOTE — Plan of Care (Signed)
  Problem: Education: Goal: Knowledge of General Education information will improve Description Including pain rating scale, medication(s)/side effects and non-pharmacologic comfort measures Outcome: Completed/Met   Problem: Health Behavior/Discharge Planning: Goal: Ability to manage health-related needs will improve Outcome: Completed/Met   Problem: Health Behavior/Discharge Planning: Goal: Ability to manage health-related needs will improve Outcome: Completed/Met   Problem: Clinical Measurements: Goal: Ability to maintain clinical measurements within normal limits will improve Outcome: Completed/Met   Problem: Clinical Measurements: Goal: Will remain free from infection Outcome: Completed/Met   Problem: Clinical Measurements: Goal: Diagnostic test results will improve Outcome: Completed/Met   Problem: Clinical Measurements: Goal: Respiratory complications will improve Outcome: Completed/Met   Problem: Clinical Measurements: Goal: Cardiovascular complication will be avoided Outcome: Completed/Met

## 2018-12-05 NOTE — TOC Transition Note (Signed)
Transition of Care [facility]) - CM/SW Discharge Note   Patient Details  Name: Erica [name] MRN: [id number] Date of Birth: 05-18-1951  Transition of Care [facility]) CM/SW Contact:  Sherald Barge, RN Phone Number: 12/05/2018, 10:06 AM   Clinical Narrative:   DC home today. With pt's permission DC plan discussed with son, Chrissie Noa. Family will be able to provide 24/7 supervision for the next few days and then will re-assess. Pt's port O2 device will be delivered to hospital prior to DC.     Expected Discharge Plan: [place]     Patient Goals and CMS Choice Patient states their goals for this hospitalization and ongoing recovery are:: be at home CMS Medicare.gov Compare Post Acute Care list provided to:: Patient Choice offered to / list presented to : Patient  Expected Discharge Plan and Services Expected Discharge Plan: [place]   Discharge Planning Services: HF Clinic Post Acute Care Choice: Durable Medical Equipment, Home Health Living arrangements for the past 2 months: Single Family Home                 DME Arranged: Oxygen DME Agency: [facility] Date DME Agency Contacted: 12/05/18 Time DME Agency Contacted: 0930 Representative spoke with at DME Agency: Ashly Vanduser Arranged: RN, PT [place] Agency: [facility] Date [place] Agency Contacted: 12/05/18 Time HH Agency Contacted: 0930 Representative spoke with at [place]: Elbert Ewings  Prior Living Arrangements/Services Living arrangements for the past 2 months: Single Family Home Lives with:: Self Patient language and need for interpreter reviewed:: Yes Do you feel safe going back to the place where you live?: Yes      Need for Family Participation in Patient Care: Yes (Comment) Care giver support system in place?: Yes (comment) Current home services: DME Criminal Activity/Legal Involvement Pertinent to Current Situation/Hospitalization: No - Comment as needed  Activities of Daily  Living Home Assistive Devices/Equipment: Cane (specify quad or straight), Walker (specify type)(hemi-walker ) ADL Screening (condition at time of admission) Patient's cognitive ability adequate to safely complete daily activities?: Yes Is the patient deaf or have difficulty hearing?: No Does the patient have difficulty seeing, even when wearing glasses/contacts?: No Does the patient have difficulty concentrating, remembering, or making decisions?: No Patient able to express need for assistance with ADLs?: Yes Does the patient have difficulty dressing or bathing?: No Independently performs ADLs?: Yes (appropriate for developmental age) Does the patient have difficulty walking or climbing stairs?: Yes Weakness of Legs: Right Weakness of Arms/Hands: Right  Permission Sought/Granted   Permission granted to share information with : Yes, Verbal Permission Granted  Share Information with NAME: son, Chrissie Noa  Permission granted to share info w AGENCY: [facility], [facility]        Emotional Assessment Appearance:: Appears stated age Attitude/Demeanor/Rapport: Engaged Affect (typically observed): Appropriate, Pleasant Orientation: : Oriented to Self, Oriented to Place, Oriented to Situation, Oriented to  Time Alcohol / Substance Use: Not Applicable Psych Involvement: No (comment)  Admission diagnosis:  Hypoxia [W09.81] Acute systolic congestive heart failure (HCC) [I50.21] Acute renal failure, unspecified acute renal failure type [facility]) [N17.9] Patient Active Problem List   Diagnosis Date Noted  . Persistent atrial fibrillation   . Acute on chronic systolic (congestive) heart failure ([place]) 11/29/2018  . Insulin dependent diabetes mellitus ([place]) 08/02/2018  . Morbid obesity ([place]) 08/02/2018  . GERD (gastroesophageal reflux disease) 04/14/2015  . Diarrhea 12/10/2013  . Carcinoid tumor   . CVA (cerebral vascular accident) ([place]) 03/07/2013  .  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  . Hyperlipidemia LDL  goal <70 04/30/2009  . OVERWEIGHT/OBESITY 04/30/2009  . Essential hypertension 04/30/2009  . CAD, NATIVE VESSEL 04/30/2009  . BRUIT 04/30/2009   PCP:  Curlene Labrum, MD Pharmacy:   [place], [place] [address] [place] [address] Phone: [phone] Fax: [phone]    Readmission Risk Interventions Readmission Risk Prevention Plan 12/05/2018  Transportation Screening Complete  PCP or Specialist Appt within 5-7 Days Complete  Home Care Screening Complete  Medication Review (RN CM) Complete  Some recent data might be hidden

## 2018-12-05 NOTE — Progress Notes (Signed)
Physical Therapy Treatment Patient Details Name: Erica [name] MRN: [id number] DOB: 1950/12/21 Today's Date: 12/05/2018    History of Present Illness Erica [name] is a 68 y.o. female with medical history significant for CAD status post CABG in 2014, chronic combined systolic and diastolic CHF with LVEF 43% in 2020, persistent atrial fibrillation on Eliquis, hypertension, dyslipidemia, insulin-dependent diabetes, stage III CKD, prior CVA, and carotid artery stenosis who presented to the ED with progressive dyspnea on exertion with minimal activity noted.  She was on the phone with her cardiology office who had been asking her to come to the hospital for admission and diuresis and she was very audibly short of breath.  She is noted to have worsening lower extremity edema, but states that her weight has remained stable at home at approximately 256 pounds.  She initially had her home torsemide dose increased from 20 mg twice daily to 40 mg twice daily and metolazone was added, but her creatinine worsened at that time to 3.52.  Her baseline is usually around 1.7.    PT Comments    Pt friendly and willing to participate with therapy today.  Min A required through session to assist with bed mobility, transfer training and min guard with gait.  Noted pt has older AFO for Rt foot drop that does not fit properly, recommend orthotist referral for new AFO to assist with gait.  Pt ambulated with SPC, no LOB episodes.  Rt LE in excessive ER.  O2A required through session with nasal cannula.  Pt with decreased saturation with exertion especially during gait, required to increase to L3 O2 A due to drop to 83% saturation.  Upon return to room able to increased to 93% saturation and reduced to 2L O2A.  Pt left in chair with call bell within reach.      Follow Up Recommendations  Home health PT;Supervision for mobility/OOB;Supervision/Assistance - 24 hour     Equipment Recommendations  None recommended by PT     Recommendations for Other Services       Precautions / Restrictions Precautions Precautions: Fall Restrictions Weight Bearing Restrictions: No   Vital Signs: Oxygen Therapy SpO2: 100 %(supine 100, seated 91-93%, during gait decrease to 83%, increased to L3 and increased to 93%, EOS 93% with 2L O2A ) O2 Device: Nasal Cannula O2 Flow Rate (L/min): 2 L/min(increased to 3L during gait due to low saturation)   Mobility  Bed Mobility Overal bed mobility: Needs Assistance Bed Mobility: Supine to Sit     Supine to sit: Min assist     General bed mobility comments: increased time, labored movements.    Transfers Overall transfer level: Needs assistance Equipment used: Straight cane Transfers: Sit to/from Stand Sit to Stand: Min guard;Min assist         General transfer comment: has difficulty with sit to stands due to BLE weakness  Ambulation/Gait Ambulation/Gait assistance: Min guard Gait Distance (Feet): 65 Feet Assistive device: Straight cane Gait Pattern/deviations: Decreased step length - right;Decreased stance time - right;Decreased stride length;Decreased dorsiflexion - right;Step-to pattern Gait velocity: decreased   General Gait Details: demonstrates slow labored movement with excessive external rotation RLE, has to wear right AFO due to drop foot, no loss of balance with mostly step-to gait pattern using SPC.  Decreased O2 saturation to 83%, increased to 3L during gait   Stairs             Wheelchair Mobility    Modified Rankin (Stroke Patients Only)  Balance                                            Cognition Arousal/Alertness: Awake/alert Behavior During Therapy: WFL for tasks assessed/performed Overall Cognitive Status: Within Functional Limits for tasks assessed                                        Exercises      General Comments        Pertinent Vitals/Pain Pain Assessment: No/denies  pain    Home Living                      Prior Function            PT Goals (current goals can now be found in the care plan section)      Frequency    Min 3X/week      PT Plan Current plan remains appropriate    Co-evaluation              AM-PAC PT "6 Clicks" Mobility   Outcome Measure  Help needed turning from your back to your side while in a flat bed without using bedrails?: A Little Help needed moving from lying on your back to sitting on the side of a flat bed without using bedrails?: A Little Help needed moving to and from a bed to a chair (including a wheelchair)?: A Little Help needed standing up from a chair using your arms (e.g., wheelchair or bedside chair)?: A Little Help needed to walk in hospital room?: A Little Help needed climbing 3-5 steps with a railing? : A Lot 6 Click Score: 17    End of Session Equipment Utilized During Treatment: Gait belt;Oxygen Activity Tolerance: Patient tolerated treatment well;Patient limited by fatigue Patient left: in chair;with call bell/phone within reach Nurse Communication: Mobility status PT Visit Diagnosis: Unsteadiness on feet (R26.81);Other abnormalities of gait and mobility (R26.89);Muscle weakness (generalized) (M62.81)     Time: 9450-3888 PT Time Calculation (min) (ACUTE ONLY): 20 min  Charges:  $Therapeutic Activity: 8-22 mins                     [address], LPTA; CBIS [phone]   Aldona Lento 12/05/2018, 1:59 PM

## 2018-12-05 NOTE — Progress Notes (Signed)
SATURATION QUALIFICATIONS: (This note is used to comply with regulatory documentation for home oxygen)  Patient Saturations on Room Air at Rest = 84 %  Patient Saturations on Room Air while Ambulating = 80%  Patient Saturations on 3 Liters of oxygen while Ambulating = 92%  Please briefly explain why patient needs home oxygen:

## 2018-12-06 ENCOUNTER — Other Ambulatory Visit: Payer: Self-pay

## 2018-12-06 DIAGNOSIS — I5023 Acute on chronic systolic (congestive) heart failure: Secondary | ICD-10-CM

## 2018-12-09 ENCOUNTER — Other Ambulatory Visit: Payer: Self-pay | Admitting: *Deleted

## 2018-12-09 DIAGNOSIS — E1122 Type 2 diabetes mellitus with diabetic chronic kidney disease: Secondary | ICD-10-CM | POA: Diagnosis not present

## 2018-12-09 DIAGNOSIS — Z9981 Dependence on supplemental oxygen: Secondary | ICD-10-CM | POA: Diagnosis not present

## 2018-12-09 DIAGNOSIS — I69351 Hemiplegia and hemiparesis following cerebral infarction affecting right dominant side: Secondary | ICD-10-CM | POA: Diagnosis not present

## 2018-12-09 DIAGNOSIS — I4891 Unspecified atrial fibrillation: Secondary | ICD-10-CM | POA: Diagnosis not present

## 2018-12-09 DIAGNOSIS — I509 Heart failure, unspecified: Secondary | ICD-10-CM | POA: Diagnosis not present

## 2018-12-09 NOTE — Patient Outreach (Signed)
[facility]) Care Management  12/09/2018  Erica [name] [facility] 08/06/1951 [id number]   Subjective: Telephone call to patient's home number, no answer, fax line tone, unable to leave a message.    Objective: Per KPN (Knowledge Performance Now, point of care tool) and chart review, patient hospitalized 11/29/2018 -12/05/2018 for acute on chronic systolic congestive heart failure.  Patient also has a history of diabetes with neuropathy, Persistent atrial fibrillation, and Acute renal failure superimposed on stage 3 chronic kidney disease.      Assessment: Received [facility] Liaison referral on 12/09/2018.   Referral source: [facility] [place] [facility].   Referral reason: complex CM.   Screening  follow up pending patient contact.     Plan: RNCM will send unsuccessful outreach  letter, [facility] pamphlet, will call patient for 2nd telephone outreach attempt within 4 business days, screening follow up, and will proceed with case closure within 10 business days if no return call.     [place] Athanas H. Annia Friendly, BSN, [place] Management [facility] Telephonic CM Phone: [phone] Fax: [phone]

## 2018-12-10 ENCOUNTER — Other Ambulatory Visit: Payer: Self-pay | Admitting: *Deleted

## 2018-12-10 ENCOUNTER — Telehealth (INDEPENDENT_AMBULATORY_CARE_PROVIDER_SITE_OTHER): Payer: Medicare HMO | Admitting: Cardiovascular Disease

## 2018-12-10 ENCOUNTER — Telehealth: Payer: Medicare HMO | Admitting: Cardiovascular Disease

## 2018-12-10 ENCOUNTER — Encounter: Payer: Self-pay | Admitting: Cardiovascular Disease

## 2018-12-10 VITALS — BP 147/68 | HR 103 | Ht 60.0 in | Wt 244.4 lb

## 2018-12-10 DIAGNOSIS — Z79899 Other long term (current) drug therapy: Secondary | ICD-10-CM

## 2018-12-10 DIAGNOSIS — I25708 Atherosclerosis of coronary artery bypass graft(s), unspecified, with other forms of angina pectoris: Secondary | ICD-10-CM

## 2018-12-10 DIAGNOSIS — N183 Chronic kidney disease, stage 3 unspecified: Secondary | ICD-10-CM

## 2018-12-10 DIAGNOSIS — I10 Essential (primary) hypertension: Secondary | ICD-10-CM

## 2018-12-10 DIAGNOSIS — E785 Hyperlipidemia, unspecified: Secondary | ICD-10-CM

## 2018-12-10 DIAGNOSIS — I5042 Chronic combined systolic (congestive) and diastolic (congestive) heart failure: Secondary | ICD-10-CM

## 2018-12-10 DIAGNOSIS — I4819 Other persistent atrial fibrillation: Secondary | ICD-10-CM

## 2018-12-10 MED ORDER — METOPROLOL SUCCINATE ER 50 MG PO TB24: ORAL_TABLET | ORAL | 3 refills | Status: DC

## 2018-12-10 NOTE — Addendum Note (Signed)
Addended by: Merlene Laughter on: 12/10/2018 11:17 AM   Modules accepted: Orders

## 2018-12-10 NOTE — Patient Outreach (Signed)
[name]) Care Management  12/10/2018  Erica [name] [facility] 1950-12-22 [id number]   Subjective: Telephone call to patient's home / mobile number, spoke with patient, and HIPAA verified.  Discussed [facility] Liaison referral follow up, patient voiced understanding, and is in agreement to follow up.   Patient states she is doing ok, having phone issues for the past  3 weeks, has periodic static, unable to hear caller at times, has reported problem to phone company numerous times, and phone company is coming out today to fix it.   Patient in agreement to complete screening assessment.   Patient states she has a hospital follow up telehealth visit with primary MD on 12/11/2018 and with cardiologist on 12/10/2018.  States she is currently receiving home health services and services are going well.  Patient states she is able to manage self care, has very supportive family / friends,  and has assistance as needed.  Patient voices understanding of medical diagnosis and treatment plan.  Patient states she has had a stroke, heart attack, and bypass surgery in the past.   States she has had social worker evaluation in the past and does quality for any community resources / services.  States she sleeps in a recliner at night as needed, has home oxygen, and follows MD treatment plan.   Patient states she is very knowledge about all of her medical conditions, knowledgeable about treatments, researches anything that she does not know, keep abreast of the latest treatments, RNCM discussed [employer] Management services, patient voiced understanding, and declined services at this time.  Patient states she does not have any education material, transition of care, care coordination, disease management, disease monitoring, transportation, community resource, or pharmacy needs at this time.  States she is very appreciative of the follow up, is in agreement to receive [place] [facility]  information, and will follow up with [facility] if services needed in the future.   Objective: Per KPN (Knowledge Performance Now, point of care tool) and chart review, patient hospitalized 11/29/2018 -12/05/2018 for acute on chronic systolic congestive heart failure.  Patient also has a history of diabetes with neuropathy, Persistent atrial fibrillation, andAcute renal failure superimposed on stage 3 chronic kidney disease.      Assessment: Received [facility] Liaison referral on 12/09/2018.   Referral source: [facility] [place] Management.   Referral reason: complex CM.  Screening follow up completed and will proceed with case closure due to patient's declined services.     Plan: RNCM will send patient successful outreach letter, [facility] pamphlet, and magnet. RNCM will complete case closure due to patient declined services. RNCM will send MD case closure letter.        [place] Sandall H. Annia Friendly, BSN, [place] Management [facility] Telephonic CM Phone: [phone] Fax: [phone]

## 2018-12-10 NOTE — Progress Notes (Signed)
Virtual Visit via Telephone Note   This visit type was conducted due to national recommendations for restrictions regarding the COVID-19 Pandemic (e.g. social distancing) in an effort to limit this patient's exposure and mitigate transmission in our community.  Due to her co-morbid illnesses, this patient is at least at moderate risk for complications without adequate follow up.  This format is felt to be most appropriate for this patient at this time.  The patient did not have access to video technology/had technical difficulties with video requiring transitioning to audio format only (telephone).  All issues noted in this document were discussed and addressed.  No physical exam could be performed with this format.  Please refer to the patient's chart for her  consent to telehealth for [facility].   Date:  12/10/2018   ID:  Erica [name], DOB 1950/12/06, MRN [id number]  Patient Location: Home Provider Location: Office  PCP:  Curlene Labrum, MD  Cardiologist:  Kate Sable, MD  Electrophysiologist:  None   Evaluation Performed:  Follow-Up Visit  Chief Complaint:  Atrial fibrillation, CHF  History of Present Illness:    Erica [name] is a 68 y.o. female with persistent atrial fibrillation, coronary artery disease and chronic combined heart failure.  She was recently hospitalized and discharged on April 30.  She underwent electrical cardioversion but only briefly maintain sinus rhythm.  She was evaluated by Dr. Domenic Polite.  She is on Toprol-XL, loading dose amiodarone, and Eliquis.  It was suggested that another cardioversion could be attempted as an outpatient when she is fully loaded on amiodarone.  She diuresed greater than 7 L during hospitalization.  She has been doing fairly well overall.  She has been monitoring her weight daily.  She has had at least 2 nosebleeds per day since starting oxygen since hospital discharge but is getting a moisture filter tomorrow.  She said  her breathing is okay if she is not doing anything strenuous.  She did say that she can breathe a lot better now since prior to hospitalization.  She has some mild chronic right leg swelling ever since her stroke but no significant edema.  Heart rates have been ranging in the 80-90 bpm range.  The patient does not have symptoms concerning for COVID-19 infection (fever, chills, cough, or new shortness of breath).    Past Medical History:  Diagnosis Date  . Atrial fibrillation ([place])    a. diagnosed in 09/2018  . CAD (coronary artery disease)    a. s/p CABG in 10/2012  . Carcinoid tumor   . Chest pain   . CHF (congestive heart failure) (HCC)    a. EF previously 40%, at 50% by repeat imaging in 03/2018  . Colon cancer ([place]) 2008  . DM (diabetes mellitus) ([place])   . HTN (hypertension)   . MI (myocardial infarction) ([place]) FEB 2014   Acute MI of anterior wall  . Pulmonary edema   . Stroke [facility]) 2010 RIGHT SIDED WEAKNESS   Past Surgical History:  Procedure Laterality Date  . BACTERIAL OVERGROWTH TEST N/A 03/18/2015   Procedure: BACTERIAL OVERGROWTH TEST;  Surgeon: Danie Binder, MD;  Location: [facility] ENDO SUITE;  Service: Endoscopy;  Laterality: N/A;  0800  . CARDIAC CATHETERIZATION  08/2008  . CARDIOVERSION N/A 12/03/2018   Procedure: CARDIOVERSION;  Surgeon: Satira Sark, MD;  Location: [facility] ENDO SUITE;  Service: Cardiovascular;  Laterality: N/A;  . CHOLECYSTECTOMY    . COLONOSCOPY N/A 03/03/2015   SLF: 1. One colon  polyp removed 2. Mild diverticulosis in the sigmoid colon 3. Small internal hemorrhoids  . CORONARY ARTERY BYPASS GRAFT  10/08/2012  . SHOULDER SURGERY    . TUBAL LIGATION       Current Meds  Medication Sig  . amiodarone (PACERONE) 200 MG tablet Take 400 mg by mouth daily.  Marland Kitchen atorvastatin (LIPITOR) 20 MG tablet Take 1 tablet (20 mg total) by mouth daily at 6 PM.  . cholecalciferol (VITAMIN D) 1000 UNITS tablet Take 1,000 Units by mouth daily.  . colestipol (COLESTID) 1 g  tablet TAKE 1 TABLET BY MOUTH 30 MINUTES BEFORE BREAKFAST AND SUPPER (Patient taking differently: Take 1 g by mouth daily. )  . ELIQUIS 5 MG TABS tablet Take 5 mg by mouth 2 (two) times daily.  . insulin NPH-regular (NOVOLIN 70/30) (70-30) 100 UNIT/ML injection Inject 15-20 Units into the skin See admin instructions. Inject 20 units every morning and 15 units nightly.  . linagliptin (TRADJENTA) 5 MG TABS tablet Take 1 tablet (5 mg total) by mouth daily.  . Magnesium 400 MG CAPS Take 1 capsule by mouth daily.  . metoprolol succinate (TOPROL-XL) 50 MG 24 hr tablet Take 1 tablet (50 mg total) by mouth daily. Take with or immediately following a meal.  . Multiple Vitamin (MULTIVITAMIN) tablet Take 1 tablet by mouth daily.  . nitroGLYCERIN (NITROSTAT) 0.4 MG SL tablet Place 1 tablet (0.4 mg total) under the tongue every 5 (five) minutes as needed for chest pain.  Marland Kitchen omeprazole (PRILOSEC) 20 MG capsule TAKE 1 CAPSULE BY MOUTH EVERY DAY (Patient taking differently: Take 20 mg by mouth daily. )  . potassium chloride SA (K-DUR) 20 MEQ tablet Take 1 tablet (20 mEq total) by mouth daily.  Marland Kitchen tiZANidine (ZANAFLEX) 4 MG tablet Take 4 mg by mouth at bedtime.   . torsemide (DEMADEX) 20 MG tablet Take 1 tablet (20 mg total) by mouth 2 (two) times daily.  . travoprost, benzalkonium, (TRAVATAN) 0.004 % ophthalmic solution Place 1 drop into both eyes at bedtime.  . [DISCONTINUED] amiodarone (PACERONE) 400 MG tablet 400mg  BID for 1 week; then 400mg  daily. (Patient taking differently: Take 400 mg by mouth daily. )     Allergies:   Bee venom and Codeine   Social History   Tobacco Use  . Smoking status: Never Smoker  . Smokeless tobacco: Never Used  Substance Use Topics  . Alcohol use: No    Alcohol/week: 0.0 standard drinks  . Drug use: No     Family Hx: The patient's family history includes Colon cancer in her paternal uncle; Diabetes in her mother; Heart disease in her mother. There is no history of Colon  polyps.  ROS:   Please see the history of present illness.     All other systems reviewed and are negative.   Prior CV studies:   The following studies were reviewed today:  Echo reviewed above, EF 25% in Feb 2020  Labs/Other Tests and Data Reviewed:    EKG:  No ECG reviewed.  Recent Labs: 11/29/2018: B Natriuretic Peptide 1,482.0 12/04/2018: Hemoglobin 7.8; Magnesium 1.8; Platelets 125 12/05/2018: BUN 40; Creatinine, Ser 1.72; Potassium 4.3; Sodium 142   Recent Lipid Panel No results found for: CHOL, TRIG, HDL, CHOLHDL, LDLCALC, LDLDIRECT  Wt Readings from Last 3 Encounters:  12/10/18 244 lb 6.4 oz (110.9 kg)  12/04/18 242 lb 8.1 oz (110 kg)  11/27/18 256 lb (116.1 kg)     Objective:    Vital Signs:  BP (!) 147/68  Pulse (!) 103   Ht 5' (1.524 m)   Wt 244 lb 6.4 oz (110.9 kg)   BMI 47.73 kg/m    VITAL SIGNS:  reviewed  ASSESSMENT & PLAN:    1.  Persistent atrial fibrillation: Currently on loading dose of amiodarone, Toprol-XL, and Eliquis.  I will consider a repeat cardioversion in the future versus rate control strategy.  Moderate left atrial dilatation echocardiogram in February 2020.  I will increase Toprol-XL to 75 mg a day.  2.  Chronic combined heart failure: Currently on Toprol-XL and torsemide 20 mg twice daily with supplemental potassium. I am unable to add ACE inhibitors, angiotensin receptor blockers, or angiotensin receptor-neprilysin inhibitors due to advanced chronic kidney disease. Checking daily weights.  I will increase Toprol XL to 75 mg a day.  3.  Ischemic cardiomyopathy: LVEF approximately 25% by echocardiogram in February 2020.  4.  Coronary artery disease: History of CABG in 2014.  Denies anginal symptoms.  She'll need a repeat echo in a few months and if it remains reduced, I will obtain a Lexiscan Myoview. Continue beta-blocker therapy and statin. She is not on ASA given the need for anticoagulation.  5.  Chronic kidney disease stage III:  Creatinine 1.72 on 12/05/2018.  6.  Hypertension: BP is mildly elevated.  I will monitor given increase of Toprol-XL as noted above.  7. HLD: Continue statin.    COVID-19 Education: The signs and symptoms of COVID-19 were discussed with the patient and how to seek care for testing (follow up with PCP or arrange E-visit).  The importance of social distancing was discussed today.  Time:   Today, I have spent 25 minutes with the patient with telehealth technology discussing the above problems.     Medication Adjustments/Labs and Tests Ordered: Current medicines are reviewed at length with the patient today.  Concerns regarding medicines are outlined above.   Tests Ordered: No orders of the defined types were placed in this encounter.   Medication Changes: No orders of the defined types were placed in this encounter.   Disposition:  Follow up in 2 month(s)  Signed, Kate Sable, MD  12/10/2018 10:19 AM    [place]

## 2018-12-10 NOTE — Patient Instructions (Addendum)
Medication Instructions:   Your physician has recommended you make the following change in your medication:   Increase metoprolol succinate (toprol xl) to 75 mg by mouth daily. Please take 1&1/2 of your 50 mg tablets daily  Continue all other medications the same  Labwork:  NONE  Testing/Procedures:  NONE  Follow-Up:  Your physician recommends that you schedule a follow-up appointment in: 2 months in the office with [place].  Any Other Special Instructions Will Be Listed Below (If Applicable).  If you need a refill on your cardiac medications before your next appointment, please call your pharmacy.

## 2018-12-11 ENCOUNTER — Ambulatory Visit: Payer: Medicare HMO | Admitting: Student

## 2018-12-11 DIAGNOSIS — E1122 Type 2 diabetes mellitus with diabetic chronic kidney disease: Secondary | ICD-10-CM | POA: Diagnosis not present

## 2018-12-11 DIAGNOSIS — Z9981 Dependence on supplemental oxygen: Secondary | ICD-10-CM | POA: Diagnosis not present

## 2018-12-11 DIAGNOSIS — I509 Heart failure, unspecified: Secondary | ICD-10-CM | POA: Diagnosis not present

## 2018-12-11 DIAGNOSIS — I4891 Unspecified atrial fibrillation: Secondary | ICD-10-CM | POA: Diagnosis not present

## 2018-12-11 DIAGNOSIS — I69351 Hemiplegia and hemiparesis following cerebral infarction affecting right dominant side: Secondary | ICD-10-CM | POA: Diagnosis not present

## 2018-12-13 DIAGNOSIS — Z9981 Dependence on supplemental oxygen: Secondary | ICD-10-CM | POA: Diagnosis not present

## 2018-12-13 DIAGNOSIS — I4891 Unspecified atrial fibrillation: Secondary | ICD-10-CM | POA: Diagnosis not present

## 2018-12-13 DIAGNOSIS — I509 Heart failure, unspecified: Secondary | ICD-10-CM | POA: Diagnosis not present

## 2018-12-13 DIAGNOSIS — E1122 Type 2 diabetes mellitus with diabetic chronic kidney disease: Secondary | ICD-10-CM | POA: Diagnosis not present

## 2018-12-13 DIAGNOSIS — I69351 Hemiplegia and hemiparesis following cerebral infarction affecting right dominant side: Secondary | ICD-10-CM | POA: Diagnosis not present

## 2018-12-16 DIAGNOSIS — I6310 Cerebral infarction due to embolism of unspecified precerebral artery: Secondary | ICD-10-CM | POA: Diagnosis not present

## 2018-12-16 DIAGNOSIS — I509 Heart failure, unspecified: Secondary | ICD-10-CM | POA: Diagnosis not present

## 2018-12-16 DIAGNOSIS — I4891 Unspecified atrial fibrillation: Secondary | ICD-10-CM | POA: Diagnosis not present

## 2018-12-16 DIAGNOSIS — E1165 Type 2 diabetes mellitus with hyperglycemia: Secondary | ICD-10-CM | POA: Diagnosis not present

## 2018-12-16 DIAGNOSIS — E1122 Type 2 diabetes mellitus with diabetic chronic kidney disease: Secondary | ICD-10-CM | POA: Diagnosis not present

## 2018-12-16 DIAGNOSIS — I69351 Hemiplegia and hemiparesis following cerebral infarction affecting right dominant side: Secondary | ICD-10-CM | POA: Diagnosis not present

## 2018-12-16 DIAGNOSIS — Z9981 Dependence on supplemental oxygen: Secondary | ICD-10-CM | POA: Diagnosis not present

## 2018-12-18 ENCOUNTER — Telehealth: Payer: Self-pay | Admitting: *Deleted

## 2018-12-18 DIAGNOSIS — I69351 Hemiplegia and hemiparesis following cerebral infarction affecting right dominant side: Secondary | ICD-10-CM | POA: Diagnosis not present

## 2018-12-18 DIAGNOSIS — I509 Heart failure, unspecified: Secondary | ICD-10-CM | POA: Diagnosis not present

## 2018-12-18 DIAGNOSIS — Z9981 Dependence on supplemental oxygen: Secondary | ICD-10-CM | POA: Diagnosis not present

## 2018-12-18 DIAGNOSIS — R7989 Other specified abnormal findings of blood chemistry: Secondary | ICD-10-CM

## 2018-12-18 DIAGNOSIS — I4891 Unspecified atrial fibrillation: Secondary | ICD-10-CM | POA: Diagnosis not present

## 2018-12-18 DIAGNOSIS — I10 Essential (primary) hypertension: Secondary | ICD-10-CM

## 2018-12-18 DIAGNOSIS — E1122 Type 2 diabetes mellitus with diabetic chronic kidney disease: Secondary | ICD-10-CM | POA: Diagnosis not present

## 2018-12-18 MED ORDER — TORSEMIDE 20 MG PO TABS: 20.0000 mg | ORAL_TABLET | Freq: Every day | ORAL | Status: DC

## 2018-12-18 NOTE — Telephone Encounter (Signed)
Notes recorded by Laurine Blazer, LPN on 2/44/9753 at 00:51 AM EDT Patient notified and verbalized understanding. Copy to pmd. She will go to [facility] from [facility], ------  Notes recorded by Herminio Commons, MD on 12/18/2018 at 10:02 AM EDT Creatinine elevated. Have her hold torsemide x 2 days and then resume at 20 mg daily. Check BMET in one week. She should weigh herself daily and take an extra 20 mg torsemide for 3 lb wt gain in 24 hrs.

## 2018-12-20 DIAGNOSIS — E1122 Type 2 diabetes mellitus with diabetic chronic kidney disease: Secondary | ICD-10-CM | POA: Diagnosis not present

## 2018-12-20 DIAGNOSIS — I69351 Hemiplegia and hemiparesis following cerebral infarction affecting right dominant side: Secondary | ICD-10-CM | POA: Diagnosis not present

## 2018-12-20 DIAGNOSIS — Z9981 Dependence on supplemental oxygen: Secondary | ICD-10-CM | POA: Diagnosis not present

## 2018-12-20 DIAGNOSIS — I509 Heart failure, unspecified: Secondary | ICD-10-CM | POA: Diagnosis not present

## 2018-12-20 DIAGNOSIS — I4891 Unspecified atrial fibrillation: Secondary | ICD-10-CM | POA: Diagnosis not present

## 2018-12-23 DIAGNOSIS — I4891 Unspecified atrial fibrillation: Secondary | ICD-10-CM | POA: Diagnosis not present

## 2018-12-23 DIAGNOSIS — E1122 Type 2 diabetes mellitus with diabetic chronic kidney disease: Secondary | ICD-10-CM | POA: Diagnosis not present

## 2018-12-23 DIAGNOSIS — I69351 Hemiplegia and hemiparesis following cerebral infarction affecting right dominant side: Secondary | ICD-10-CM | POA: Diagnosis not present

## 2018-12-23 DIAGNOSIS — I509 Heart failure, unspecified: Secondary | ICD-10-CM | POA: Diagnosis not present

## 2018-12-23 DIAGNOSIS — Z9981 Dependence on supplemental oxygen: Secondary | ICD-10-CM | POA: Diagnosis not present

## 2018-12-25 DIAGNOSIS — E1122 Type 2 diabetes mellitus with diabetic chronic kidney disease: Secondary | ICD-10-CM | POA: Diagnosis not present

## 2018-12-25 DIAGNOSIS — I509 Heart failure, unspecified: Secondary | ICD-10-CM | POA: Diagnosis not present

## 2018-12-25 DIAGNOSIS — Z9981 Dependence on supplemental oxygen: Secondary | ICD-10-CM | POA: Diagnosis not present

## 2018-12-25 DIAGNOSIS — I4891 Unspecified atrial fibrillation: Secondary | ICD-10-CM | POA: Diagnosis not present

## 2018-12-25 DIAGNOSIS — I69351 Hemiplegia and hemiparesis following cerebral infarction affecting right dominant side: Secondary | ICD-10-CM | POA: Diagnosis not present

## 2018-12-26 DIAGNOSIS — R21 Rash and other nonspecific skin eruption: Secondary | ICD-10-CM | POA: Diagnosis not present

## 2018-12-27 DIAGNOSIS — R7989 Other specified abnormal findings of blood chemistry: Secondary | ICD-10-CM | POA: Diagnosis not present

## 2018-12-27 DIAGNOSIS — I10 Essential (primary) hypertension: Secondary | ICD-10-CM | POA: Diagnosis not present

## 2018-12-28 LAB — BASIC METABOLIC PANEL
BUN/Creatinine Ratio: 18 (calc) (ref 6–22)
BUN: 49 mg/dL — ABNORMAL HIGH (ref 7–25)
CO2: 26 mmol/L (ref 20–32)
Calcium: 9 mg/dL (ref 8.6–10.4)
Chloride: 104 mmol/L (ref 98–110)
Creat: 2.8 mg/dL — ABNORMAL HIGH (ref 0.50–0.99)
Glucose, Bld: 141 mg/dL — ABNORMAL HIGH (ref 65–99)
Potassium: 4.8 mmol/L (ref 3.5–5.3)
Sodium: 139 mmol/L (ref 135–146)

## 2018-12-30 DIAGNOSIS — I509 Heart failure, unspecified: Secondary | ICD-10-CM | POA: Diagnosis not present

## 2018-12-30 DIAGNOSIS — I69351 Hemiplegia and hemiparesis following cerebral infarction affecting right dominant side: Secondary | ICD-10-CM | POA: Diagnosis not present

## 2018-12-30 DIAGNOSIS — E1122 Type 2 diabetes mellitus with diabetic chronic kidney disease: Secondary | ICD-10-CM | POA: Diagnosis not present

## 2018-12-30 DIAGNOSIS — I4891 Unspecified atrial fibrillation: Secondary | ICD-10-CM | POA: Diagnosis not present

## 2018-12-30 DIAGNOSIS — Z9981 Dependence on supplemental oxygen: Secondary | ICD-10-CM | POA: Diagnosis not present

## 2018-12-31 DIAGNOSIS — I4891 Unspecified atrial fibrillation: Secondary | ICD-10-CM | POA: Diagnosis not present

## 2018-12-31 DIAGNOSIS — Z9981 Dependence on supplemental oxygen: Secondary | ICD-10-CM | POA: Diagnosis not present

## 2018-12-31 DIAGNOSIS — I69351 Hemiplegia and hemiparesis following cerebral infarction affecting right dominant side: Secondary | ICD-10-CM | POA: Diagnosis not present

## 2018-12-31 DIAGNOSIS — E1122 Type 2 diabetes mellitus with diabetic chronic kidney disease: Secondary | ICD-10-CM | POA: Diagnosis not present

## 2018-12-31 DIAGNOSIS — I509 Heart failure, unspecified: Secondary | ICD-10-CM | POA: Diagnosis not present

## 2019-01-01 ENCOUNTER — Emergency Department (HOSPITAL_COMMUNITY): Payer: Medicare HMO

## 2019-01-01 ENCOUNTER — Encounter (HOSPITAL_COMMUNITY): Payer: Self-pay | Admitting: Emergency Medicine

## 2019-01-01 ENCOUNTER — Other Ambulatory Visit: Payer: Self-pay

## 2019-01-01 ENCOUNTER — Inpatient Hospital Stay (HOSPITAL_COMMUNITY)
Admission: EM | Admit: 2019-01-01 | Discharge: 2019-01-07 | DRG: 871 | Disposition: A | Discharge status: Skilled Nursing Facility | Payer: Medicare HMO | Attending: Family Medicine | Admitting: Family Medicine

## 2019-01-01 DIAGNOSIS — A419 Sepsis, unspecified organism: Principal | ICD-10-CM | POA: Diagnosis present

## 2019-01-01 DIAGNOSIS — J90 Pleural effusion, not elsewhere classified: Secondary | ICD-10-CM

## 2019-01-01 DIAGNOSIS — I639 Cerebral infarction, unspecified: Secondary | ICD-10-CM | POA: Diagnosis present

## 2019-01-01 DIAGNOSIS — I110 Hypertensive heart disease with heart failure: Secondary | ICD-10-CM | POA: Diagnosis not present

## 2019-01-01 DIAGNOSIS — D638 Anemia in other chronic diseases classified elsewhere: Secondary | ICD-10-CM | POA: Diagnosis present

## 2019-01-01 DIAGNOSIS — N179 Acute kidney failure, unspecified: Secondary | ICD-10-CM | POA: Diagnosis present

## 2019-01-01 DIAGNOSIS — Z20828 Contact with and (suspected) exposure to other viral communicable diseases: Secondary | ICD-10-CM | POA: Diagnosis present

## 2019-01-01 DIAGNOSIS — Z951 Presence of aortocoronary bypass graft: Secondary | ICD-10-CM

## 2019-01-01 DIAGNOSIS — L8990 Pressure ulcer of unspecified site, unspecified stage: Secondary | ICD-10-CM

## 2019-01-01 DIAGNOSIS — Z885 Allergy status to narcotic agent status: Secondary | ICD-10-CM

## 2019-01-01 DIAGNOSIS — I5020 Unspecified systolic (congestive) heart failure: Secondary | ICD-10-CM

## 2019-01-01 DIAGNOSIS — Z91030 Bee allergy status: Secondary | ICD-10-CM

## 2019-01-01 DIAGNOSIS — E1122 Type 2 diabetes mellitus with diabetic chronic kidney disease: Secondary | ICD-10-CM | POA: Diagnosis present

## 2019-01-01 DIAGNOSIS — I252 Old myocardial infarction: Secondary | ICD-10-CM

## 2019-01-01 DIAGNOSIS — J9621 Acute and chronic respiratory failure with hypoxia: Secondary | ICD-10-CM | POA: Diagnosis present

## 2019-01-01 DIAGNOSIS — I5043 Acute on chronic combined systolic (congestive) and diastolic (congestive) heart failure: Secondary | ICD-10-CM | POA: Diagnosis not present

## 2019-01-01 DIAGNOSIS — Z794 Long term (current) use of insulin: Secondary | ICD-10-CM

## 2019-01-01 DIAGNOSIS — Z7902 Long term (current) use of antithrombotics/antiplatelets: Secondary | ICD-10-CM

## 2019-01-01 DIAGNOSIS — R0602 Shortness of breath: Secondary | ICD-10-CM | POA: Diagnosis not present

## 2019-01-01 DIAGNOSIS — Z8249 Family history of ischemic heart disease and other diseases of the circulatory system: Secondary | ICD-10-CM

## 2019-01-01 DIAGNOSIS — Z85038 Personal history of other malignant neoplasm of large intestine: Secondary | ICD-10-CM

## 2019-01-01 DIAGNOSIS — Z800 Family history of malignant neoplasm of digestive organs: Secondary | ICD-10-CM

## 2019-01-01 DIAGNOSIS — E785 Hyperlipidemia, unspecified: Secondary | ICD-10-CM | POA: Diagnosis present

## 2019-01-01 DIAGNOSIS — J811 Chronic pulmonary edema: Secondary | ICD-10-CM

## 2019-01-01 DIAGNOSIS — I130 Hypertensive heart and chronic kidney disease with heart failure and stage 1 through stage 4 chronic kidney disease, or unspecified chronic kidney disease: Secondary | ICD-10-CM | POA: Diagnosis present

## 2019-01-01 DIAGNOSIS — K219 Gastro-esophageal reflux disease without esophagitis: Secondary | ICD-10-CM | POA: Diagnosis present

## 2019-01-01 DIAGNOSIS — I509 Heart failure, unspecified: Secondary | ICD-10-CM

## 2019-01-01 DIAGNOSIS — Z6841 Body Mass Index (BMI) 40.0 and over, adult: Secondary | ICD-10-CM

## 2019-01-01 DIAGNOSIS — Z833 Family history of diabetes mellitus: Secondary | ICD-10-CM

## 2019-01-01 DIAGNOSIS — E1121 Type 2 diabetes mellitus with diabetic nephropathy: Secondary | ICD-10-CM | POA: Diagnosis present

## 2019-01-01 DIAGNOSIS — I5022 Chronic systolic (congestive) heart failure: Secondary | ICD-10-CM | POA: Diagnosis not present

## 2019-01-01 DIAGNOSIS — L03115 Cellulitis of right lower limb: Secondary | ICD-10-CM | POA: Diagnosis present

## 2019-01-01 DIAGNOSIS — Z9049 Acquired absence of other specified parts of digestive tract: Secondary | ICD-10-CM

## 2019-01-01 DIAGNOSIS — N183 Chronic kidney disease, stage 3 (moderate): Secondary | ICD-10-CM | POA: Diagnosis present

## 2019-01-01 DIAGNOSIS — I25708 Atherosclerosis of coronary artery bypass graft(s), unspecified, with other forms of angina pectoris: Secondary | ICD-10-CM

## 2019-01-01 DIAGNOSIS — B955 Unspecified streptococcus as the cause of diseases classified elsewhere: Secondary | ICD-10-CM | POA: Diagnosis present

## 2019-01-01 DIAGNOSIS — I255 Ischemic cardiomyopathy: Secondary | ICD-10-CM | POA: Diagnosis present

## 2019-01-01 DIAGNOSIS — I5023 Acute on chronic systolic (congestive) heart failure: Secondary | ICD-10-CM | POA: Diagnosis present

## 2019-01-01 DIAGNOSIS — R7881 Bacteremia: Secondary | ICD-10-CM

## 2019-01-01 DIAGNOSIS — Z79899 Other long term (current) drug therapy: Secondary | ICD-10-CM

## 2019-01-01 DIAGNOSIS — L89152 Pressure ulcer of sacral region, stage 2: Secondary | ICD-10-CM | POA: Diagnosis present

## 2019-01-01 DIAGNOSIS — I2510 Atherosclerotic heart disease of native coronary artery without angina pectoris: Secondary | ICD-10-CM | POA: Diagnosis present

## 2019-01-01 DIAGNOSIS — Z7901 Long term (current) use of anticoagulants: Secondary | ICD-10-CM

## 2019-01-01 DIAGNOSIS — I4819 Other persistent atrial fibrillation: Secondary | ICD-10-CM | POA: Diagnosis present

## 2019-01-01 DIAGNOSIS — I69351 Hemiplegia and hemiparesis following cerebral infarction affecting right dominant side: Secondary | ICD-10-CM

## 2019-01-01 LAB — CBC WITH DIFFERENTIAL/PLATELET
Abs Immature Granulocytes: 0.06 10*3/uL (ref 0.00–0.07)
Basophils Absolute: 0 10*3/uL (ref 0.0–0.1)
Basophils Relative: 0 %
Eosinophils Absolute: 0 10*3/uL (ref 0.0–0.5)
Eosinophils Relative: 0 %
HCT: 32 % — ABNORMAL LOW (ref 36.0–46.0)
Hemoglobin: 9.3 g/dL — ABNORMAL LOW (ref 12.0–15.0)
Immature Granulocytes: 0 %
Lymphocytes Relative: 4 %
Lymphs Abs: 0.6 10*3/uL — ABNORMAL LOW (ref 0.7–4.0)
MCH: 22.9 pg — ABNORMAL LOW (ref 26.0–34.0)
MCHC: 29.1 g/dL — ABNORMAL LOW (ref 30.0–36.0)
MCV: 78.8 fL — ABNORMAL LOW (ref 80.0–100.0)
Monocytes Absolute: 0.7 10*3/uL (ref 0.1–1.0)
Monocytes Relative: 5 %
Neutro Abs: 13.4 10*3/uL — ABNORMAL HIGH (ref 1.7–7.7)
Neutrophils Relative %: 91 %
Platelets: 139 10*3/uL — ABNORMAL LOW (ref 150–400)
RBC: 4.06 MIL/uL (ref 3.87–5.11)
RDW: 21.2 % — ABNORMAL HIGH (ref 11.5–15.5)
WBC: 14.8 10*3/uL — ABNORMAL HIGH (ref 4.0–10.5)
nRBC: 0 % (ref 0.0–0.2)

## 2019-01-01 LAB — COMPREHENSIVE METABOLIC PANEL
ALT: 17 U/L (ref 0–44)
AST: 25 U/L (ref 15–41)
Albumin: 3.7 g/dL (ref 3.5–5.0)
Alkaline Phosphatase: 92 U/L (ref 38–126)
Anion gap: 15 (ref 5–15)
BUN: 62 mg/dL — ABNORMAL HIGH (ref 8–23)
CO2: 20 mmol/L — ABNORMAL LOW (ref 22–32)
Calcium: 9.1 mg/dL (ref 8.9–10.3)
Chloride: 104 mmol/L (ref 98–111)
Creatinine, Ser: 2.87 mg/dL — ABNORMAL HIGH (ref 0.44–1.00)
GFR calc Af Amer: 19 mL/min — ABNORMAL LOW (ref >60)
GFR calc non Af Amer: 16 mL/min — ABNORMAL LOW (ref >60)
Glucose, Bld: 64 mg/dL — ABNORMAL LOW (ref 70–99)
Potassium: 5.1 mmol/L (ref 3.5–5.1)
Sodium: 139 mmol/L (ref 135–145)
Total Bilirubin: 1.9 mg/dL — ABNORMAL HIGH (ref 0.3–1.2)
Total Protein: 6.9 g/dL (ref 6.5–8.1)

## 2019-01-01 LAB — URINALYSIS, ROUTINE W REFLEX MICROSCOPIC
Bilirubin Urine: NEGATIVE
Glucose, UA: NEGATIVE mg/dL
Ketones, ur: NEGATIVE mg/dL
Leukocytes,Ua: NEGATIVE
Nitrite: NEGATIVE
Protein, ur: NEGATIVE mg/dL
RBC / HPF: >50 RBC/hpf — ABNORMAL HIGH (ref 0–5)
Specific Gravity, Urine: 1.009 (ref 1.005–1.030)
pH: 5 (ref 5.0–8.0)

## 2019-01-01 LAB — GLUCOSE, CAPILLARY
Glucose-Capillary: 83 mg/dL (ref 70–99)
Glucose-Capillary: 89 mg/dL (ref 70–99)
Glucose-Capillary: 43 mg/dL — CL (ref 70–99)
Glucose-Capillary: 62 mg/dL — ABNORMAL LOW (ref 70–99)
Glucose-Capillary: 79 mg/dL (ref 70–99)

## 2019-01-01 LAB — CBG MONITORING, ED: Glucose-Capillary: 57 mg/dL — ABNORMAL LOW (ref 70–99)

## 2019-01-01 LAB — SARS CORONAVIRUS 2 BY RT PCR (HOSPITAL ORDER, PERFORMED IN ~~LOC~~ HOSPITAL LAB): SARS Coronavirus 2: NEGATIVE

## 2019-01-01 LAB — BRAIN NATRIURETIC PEPTIDE: B Natriuretic Peptide: 1987 pg/mL — ABNORMAL HIGH (ref 0.0–100.0)

## 2019-01-01 LAB — TROPONIN I: Troponin I: 0.03 ng/mL (ref <0.03)

## 2019-01-01 LAB — LACTIC ACID, PLASMA: Lactic Acid, Venous: 1.8 mmol/L (ref 0.5–1.9)

## 2019-01-01 MED ORDER — ONDANSETRON HCL 4 MG/2ML IJ SOLN: 4.0000 mg | Freq: Four times a day (QID) | INTRAMUSCULAR | Status: DC | PRN

## 2019-01-01 MED ORDER — POLYETHYLENE GLYCOL 3350 17 G PO PACK: 17.0000 g | PACK | Freq: Every day | ORAL | Status: DC | PRN

## 2019-01-01 MED ORDER — MAGNESIUM OXIDE 400 (241.3 MG) MG PO TABS
400.0000 mg | ORAL_TABLET | Freq: Every day | ORAL | Status: DC
  Administered 2019-01-01 – 2019-01-07 (×7): 400 mg via ORAL
  Filled 2019-01-01 (×7): qty 1

## 2019-01-01 MED ORDER — INSULIN ASPART PROT & ASPART (70-30 MIX) 100 UNIT/ML ~~LOC~~ SUSP
15.0000 [IU] | Freq: Two times a day (BID) | SUBCUTANEOUS | Status: DC
  Administered 2019-01-01: 15 [IU] via SUBCUTANEOUS
  Filled 2019-01-01: qty 10

## 2019-01-01 MED ORDER — APIXABAN 5 MG PO TABS
5.0000 mg | ORAL_TABLET | Freq: Two times a day (BID) | ORAL | Status: DC
  Administered 2019-01-01 – 2019-01-07 (×13): 5 mg via ORAL
  Filled 2019-01-01 (×13): qty 1

## 2019-01-01 MED ORDER — METOPROLOL SUCCINATE ER 50 MG PO TB24
50.0000 mg | ORAL_TABLET | Freq: Every day | ORAL | Status: DC
  Administered 2019-01-01 – 2019-01-07 (×7): 50 mg via ORAL
  Filled 2019-01-01 (×7): qty 1

## 2019-01-01 MED ORDER — TIZANIDINE HCL 4 MG PO TABS
4.0000 mg | ORAL_TABLET | Freq: Every day | ORAL | Status: DC
  Administered 2019-01-01 – 2019-01-06 (×6): 4 mg via ORAL
  Filled 2019-01-01 (×6): qty 1

## 2019-01-01 MED ORDER — LINAGLIPTIN 5 MG PO TABS
5.0000 mg | ORAL_TABLET | Freq: Every day | ORAL | Status: DC
  Filled 2019-01-01: qty 1

## 2019-01-01 MED ORDER — LATANOPROST 0.005 % OP SOLN
1.0000 [drp] | Freq: Every day | OPHTHALMIC | Status: DC
  Administered 2019-01-02 – 2019-01-06 (×5): 1 [drp] via OPHTHALMIC
  Filled 2019-01-01 (×2): qty 2.5

## 2019-01-01 MED ORDER — INSULIN ASPART 100 UNIT/ML ~~LOC~~ SOLN: 0.0000 [IU] | Freq: Three times a day (TID) | SUBCUTANEOUS | Status: DC

## 2019-01-01 MED ORDER — FUROSEMIDE 10 MG/ML IJ SOLN
40.0000 mg | Freq: Two times a day (BID) | INTRAMUSCULAR | Status: DC
  Administered 2019-01-01 – 2019-01-02 (×3): 40 mg via INTRAVENOUS
  Filled 2019-01-01 (×5): qty 4

## 2019-01-01 MED ORDER — SODIUM CHLORIDE 0.9% FLUSH
3.0000 mL | Freq: Two times a day (BID) | INTRAVENOUS | Status: DC
  Administered 2019-01-01 – 2019-01-07 (×12): 3 mL via INTRAVENOUS

## 2019-01-01 MED ORDER — SODIUM CHLORIDE 0.9 % IV SOLN: 250.0000 mL | INTRAVENOUS | Status: DC | PRN

## 2019-01-01 MED ORDER — TRAVOPROST (BAK FREE) 0.004 % OP SOLN
1.0000 [drp] | Freq: Every day | OPHTHALMIC | Status: DC
  Filled 2019-01-01: qty 2.5

## 2019-01-01 MED ORDER — ACETAMINOPHEN 650 MG RE SUPP: 650.0000 mg | Freq: Four times a day (QID) | RECTAL | Status: DC | PRN

## 2019-01-01 MED ORDER — ACETAMINOPHEN 325 MG PO TABS
650.0000 mg | ORAL_TABLET | Freq: Four times a day (QID) | ORAL | Status: DC | PRN
  Administered 2019-01-01 – 2019-01-04 (×2): 650 mg via ORAL
  Filled 2019-01-01 (×2): qty 2

## 2019-01-01 MED ORDER — FUROSEMIDE 10 MG/ML IJ SOLN
40.0000 mg | Freq: Once | INTRAMUSCULAR | Status: AC
  Administered 2019-01-01: 40 mg via INTRAVENOUS
  Filled 2019-01-01: qty 4

## 2019-01-01 MED ORDER — INSULIN ASPART 100 UNIT/ML ~~LOC~~ SOLN
3.0000 [IU] | Freq: Three times a day (TID) | SUBCUTANEOUS | Status: DC
  Administered 2019-01-01 (×2): 3 [IU] via SUBCUTANEOUS

## 2019-01-01 MED ORDER — POTASSIUM CHLORIDE CRYS ER 20 MEQ PO TBCR
20.0000 meq | EXTENDED_RELEASE_TABLET | Freq: Every day | ORAL | Status: DC
  Administered 2019-01-01 – 2019-01-07 (×6): 20 meq via ORAL
  Filled 2019-01-01 (×6): qty 1

## 2019-01-01 MED ORDER — SODIUM CHLORIDE 0.9 % IV SOLN
1.0000 g | INTRAVENOUS | Status: DC
  Administered 2019-01-01 – 2019-01-02 (×2): 1 g via INTRAVENOUS
  Filled 2019-01-01 (×2): qty 10

## 2019-01-01 MED ORDER — ALBUTEROL SULFATE (2.5 MG/3ML) 0.083% IN NEBU: 2.5000 mg | INHALATION_SOLUTION | RESPIRATORY_TRACT | Status: DC | PRN

## 2019-01-01 MED ORDER — INSULIN ASPART 100 UNIT/ML ~~LOC~~ SOLN: 0.0000 [IU] | Freq: Every day | SUBCUTANEOUS | Status: DC

## 2019-01-01 MED ORDER — COLESTIPOL HCL 1 G PO TABS
1.0000 g | ORAL_TABLET | Freq: Every day | ORAL | Status: DC
  Administered 2019-01-01 – 2019-01-07 (×7): 1 g via ORAL
  Filled 2019-01-01 (×8): qty 1

## 2019-01-01 MED ORDER — TORSEMIDE 20 MG PO TABS
20.0000 mg | ORAL_TABLET | Freq: Every day | ORAL | Status: DC
  Administered 2019-01-01: 20 mg via ORAL
  Filled 2019-01-01: qty 1

## 2019-01-01 MED ORDER — ATORVASTATIN CALCIUM 20 MG PO TABS
20.0000 mg | ORAL_TABLET | Freq: Every day | ORAL | Status: DC
  Administered 2019-01-01 – 2019-01-07 (×7): 20 mg via ORAL
  Filled 2019-01-01 (×7): qty 1

## 2019-01-01 MED ORDER — NITROGLYCERIN 0.4 MG SL SUBL: 0.4000 mg | SUBLINGUAL_TABLET | SUBLINGUAL | Status: DC | PRN

## 2019-01-01 MED ORDER — ADULT MULTIVITAMIN W/MINERALS CH
1.0000 | ORAL_TABLET | Freq: Every day | ORAL | Status: DC
  Administered 2019-01-01 – 2019-01-07 (×7): 1 via ORAL
  Filled 2019-01-01 (×7): qty 1

## 2019-01-01 MED ORDER — ONDANSETRON HCL 4 MG PO TABS: 4.0000 mg | ORAL_TABLET | Freq: Four times a day (QID) | ORAL | Status: DC | PRN

## 2019-01-01 MED ORDER — VANCOMYCIN HCL IN DEXTROSE 1-5 GM/200ML-% IV SOLN
1000.0000 mg | Freq: Once | INTRAVENOUS | Status: AC
  Administered 2019-01-01: 1000 mg via INTRAVENOUS
  Filled 2019-01-01: qty 200

## 2019-01-01 MED ORDER — TRAZODONE HCL 50 MG PO TABS
50.0000 mg | ORAL_TABLET | Freq: Every evening | ORAL | Status: DC | PRN
  Administered 2019-01-02: 50 mg via ORAL
  Filled 2019-01-01 (×2): qty 1

## 2019-01-01 MED ORDER — VITAMIN D 25 MCG (1000 UNIT) PO TABS
1000.0000 [IU] | ORAL_TABLET | Freq: Every day | ORAL | Status: DC
  Administered 2019-01-01 – 2019-01-07 (×7): 1000 [IU] via ORAL
  Filled 2019-01-01 (×7): qty 1

## 2019-01-01 MED ORDER — AMIODARONE HCL 200 MG PO TABS
400.0000 mg | ORAL_TABLET | Freq: Every day | ORAL | Status: DC
  Administered 2019-01-01 – 2019-01-02 (×2): 400 mg via ORAL
  Filled 2019-01-01 (×3): qty 2

## 2019-01-01 MED ORDER — PANTOPRAZOLE SODIUM 40 MG PO TBEC
40.0000 mg | DELAYED_RELEASE_TABLET | Freq: Every day | ORAL | Status: DC
  Administered 2019-01-03: 40 mg via ORAL
  Filled 2019-01-01 (×5): qty 1

## 2019-01-01 MED ORDER — SODIUM CHLORIDE 0.9% FLUSH
3.0000 mL | INTRAVENOUS | Status: DC | PRN
  Administered 2019-01-05: 3 mL via INTRAVENOUS
  Filled 2019-01-01: qty 3

## 2019-01-01 NOTE — Progress Notes (Addendum)
Inpatient Diabetes Program Recommendations  AACE/ADA: New Consensus Statement on Inpatient Glycemic Control (2015)  Target Ranges:  Prepandial:   less than 140 mg/dL      Peak postprandial:   less than 180 mg/dL (1-2 hours)      Critically ill patients:  140 - 180 mg/dL   Lab Results  Component Value Date   GLUCAP 57 (L) 01/01/2019   Review of Glycemic Control  Diabetes history: DM2 Outpatient Diabetes medications: Novolin 70/30 insulin mix + Tradjenta 5 qd Current orders for Inpatient glycemic control: Novolog 70/30 15 units bid + Novolog 3 units meal coverage +Novolog moderate correction tid + hs 0-5 units + Tradjenta 5 mg qd  Inpatient Diabetes Program Recommendations:   Patient is now eating and discussed insulin regimen with Dr. Denton Brick. Will follow during hospitalization.  17:10 pm Spoke with RN Venita Sheffield regarding keeping close check on patient's CBG due to patient just received 70/30 + Novolog meal coverage and reported eating 50% of her meal. Requested to recheck CBG in 2 hrs.  Thank you, Nani Gasser. Yeilin Zweber, RN, MSN, CDE  Diabetes Coordinator Inpatient Glycemic Control Team Team Pager [phone] (8am-5pm) 01/01/2019 10:21 AM

## 2019-01-01 NOTE — Care Management Obs Status (Signed)
[place] NOTIFICATION   Patient Details  Name: Erica [name] MRN: [id number] Date of Birth: 11-30-1950   Medicare Observation Status Notification Given:  Yes    [name] 01/01/2019, 2:56 PM

## 2019-01-01 NOTE — ED Notes (Signed)
2 PIV placed. RN unable to get blood return from either one. Phleb aware.

## 2019-01-01 NOTE — ED Provider Notes (Signed)
[facility] EMERGENCY DEPARTMENT Provider Note   CSN: [id number] Arrival date & time: 01/01/19  0419    History   Chief Complaint Chief Complaint  Patient presents with   Shortness of Breath    HPI Erica [name] is a 68 y.o. female.     Patient complains of shortness of breath and swelling in her legs with redness and pain in the right leg.  Patient has a history of heart failure and kidney disease but she is not on dialysis  The history is provided by the patient. No language interpreter was used.  Shortness of Breath  Severity:  Moderate Onset quality:  Sudden Timing:  Constant Progression:  Worsening Chronicity:  Recurrent Context: activity   Relieved by:  Nothing Worsened by:  Nothing Ineffective treatments:  None tried Associated symptoms: rash   Associated symptoms: no abdominal pain, no chest pain, no cough and no headaches     Past Medical History:  Diagnosis Date   Atrial fibrillation ([place])    a. diagnosed in 09/2018   CAD (coronary artery disease)    a. s/p CABG in 10/2012   Carcinoid tumor    Chest pain    CHF (congestive heart failure) ([place])    a. 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   Obesity, Class III, BMI 40-49.9 (morbid obesity) ([place]) 08/02/2018   GERD (gastroesophageal reflux disease) 04/14/2015   Diarrhea 12/10/2013   Carcinoid tumor    CVA (cerebral vascular accident) ([place]) 03/07/2013    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   Hyperlipidemia LDL goal <70 04/30/2009   OVERWEIGHT/OBESITY 04/30/2009   Essential hypertension 04/30/2009   CAD, NATIVE VESSEL 04/30/2009   BRUIT 04/30/2009    Past Surgical History:  Procedure Laterality Date   BACTERIAL OVERGROWTH TEST N/A 03/18/2015   Procedure: BACTERIAL OVERGROWTH TEST;  Surgeon: Danie Binder, MD;  Location: [facility] ENDO SUITE;  Service: Endoscopy;  Laterality: N/A;  0800   CARDIAC CATHETERIZATION  08/2008   CARDIOVERSION N/A 12/03/2018   Procedure: CARDIOVERSION;  Surgeon: Satira Sark, MD;  Location: [facility] ENDO SUITE;  Service: Cardiovascular;  Laterality: N/A;   CHOLECYSTECTOMY     COLONOSCOPY N/A 03/03/2015   SLF: 1. One colon polyp removed 2. Mild diverticulosis in the sigmoid colon 3. Small internal hemorrhoids   CORONARY ARTERY BYPASS GRAFT  10/08/2012   SHOULDER SURGERY     TUBAL LIGATION       OB History   No obstetric history on file.      Home Medications    Prior to Admission medications   Medication Sig Start Date End Date Taking? Authorizing Provider  amiodarone (PACERONE) 200 MG tablet Take 400 mg by mouth daily. 12/05/18   [provider]  atorvastatin (LIPITOR) 20 MG tablet Take 1 tablet (20 mg total) by mouth daily at 6 PM. 12/05/18   Barton Dubois, MD  cholecalciferol (  VITAMIN D) 1000 UNITS tablet Take 1,000 Units by mouth daily.    [provider]  colestipol (COLESTID) 1 g tablet TAKE 1 TABLET BY MOUTH 30 MINUTES BEFORE BREAKFAST AND SUPPER Patient taking differently: Take 1 g by mouth daily.  05/16/16   Carlis Stable, NP  ELIQUIS 5 MG TABS tablet Take 5 mg by mouth 2 (two) times daily. 10/07/18   [provider]  insulin NPH-regular (NOVOLIN 70/30) (70-30) 100 UNIT/ML injection Inject 15-20 Units into the skin See admin instructions. Inject 20 units every morning and 15 units nightly.    [provider]  linagliptin (TRADJENTA) 5 MG TABS tablet Take 1 tablet (5 mg  total) by mouth daily. 12/05/18   Barton Dubois, MD  Magnesium 400 MG CAPS Take 1 capsule by mouth daily. 11/12/13   Herminio Commons, MD  metoprolol succinate (TOPROL-XL) 50 MG 24 hr tablet Take 75 mg by mouth daily. Take with or immediately following a meal. 12/10/18   Herminio Commons, MD  Multiple Vitamin (MULTIVITAMIN) tablet Take 1 tablet by mouth daily.    [provider]  nitroGLYCERIN (NITROSTAT) 0.4 MG SL tablet Place 1 tablet (0.4 mg total) under the tongue every 5 (five) minutes as needed for chest pain. 06/05/17   Herminio Commons, MD  omeprazole (PRILOSEC) 20 MG capsule TAKE 1 CAPSULE BY MOUTH EVERY DAY Patient taking differently: Take 20 mg by mouth daily.  04/17/16   Mahala Menghini, PA-C  potassium chloride SA (K-DUR) 20 MEQ tablet Take 1 tablet (20 mEq total) by mouth daily. 12/06/18   Barton Dubois, MD  tiZANidine (ZANAFLEX) 4 MG tablet Take 4 mg by mouth at bedtime.     [provider]  torsemide (DEMADEX) 20 MG tablet Take 1 tablet (20 mg total) by mouth daily. 12/18/18   Herminio Commons, MD  travoprost, benzalkonium, (TRAVATAN) 0.004 % ophthalmic solution Place 1 drop into both eyes at bedtime.    [provider]    Family History Family History  Problem Relation Age of Onset   Diabetes Mother    Heart disease Mother    Colon cancer Paternal Uncle    Colon polyps Neg Hx     Social History Social History   Tobacco Use   Smoking status: Never Smoker   Smokeless tobacco: Never Used  Substance Use Topics   Alcohol use: No    Alcohol/week: 0.0 standard drinks   Drug use: No     Allergies   Bee venom and Codeine   Review of Systems Review of Systems  Constitutional: Negative for appetite change and fatigue.  HENT: Negative for congestion, ear discharge and sinus pressure.   Eyes: Negative for discharge.  Respiratory: Positive for shortness of breath. Negative for cough.   Cardiovascular: Negative for chest pain.   Gastrointestinal: Negative for abdominal pain and diarrhea.  Genitourinary: Negative for frequency and hematuria.  Musculoskeletal: Negative for back pain.  Skin: Positive for rash.  Neurological: Negative for seizures and headaches.  Psychiatric/Behavioral: Negative for hallucinations.     Physical Exam Updated Vital Signs BP (!) 99/53    Pulse (!) 120    Temp 98.8 F (37.1 C) (Oral)    Resp (!) 27    Ht 5' (1.524 m)    Wt 113.9 kg    SpO2 100%    BMI 49.02 kg/m   Physical Exam Vitals signs and nursing note reviewed.  Constitutional:      Appearance: She is well-developed.  HENT:     Head: Normocephalic.     Nose: Nose normal.  Eyes:     General: No scleral icterus.    Conjunctiva/sclera: Conjunctivae normal.  Neck:     Musculoskeletal: Neck supple.     Thyroid: No thyromegaly.  Cardiovascular:     Rate and Rhythm: Tachycardia present. Rhythm irregular.     Heart sounds: No murmur. No friction rub. No gallop.   Pulmonary:     Breath sounds: No stridor. No wheezing or rales.  Chest:     Chest wall: No tenderness.  Abdominal:     General: There is no distension.     Tenderness: There is no abdominal tenderness. There is no rebound.  Musculoskeletal: Normal range of motion.     Comments: Patient has significant edema in both legs with redness and tenderness to right lower leg  Lymphadenopathy:     Cervical: No cervical adenopathy.  Skin:    Findings: Erythema present. No rash.  Neurological:     Mental Status: She is alert and oriented to person, place, and time.     Motor: No abnormal muscle tone.     Coordination: Coordination normal.  Psychiatric:        Behavior: Behavior normal.      ED Treatments / Results  Labs (all labs ordered are listed, but only abnormal results are displayed) Labs Reviewed  COMPREHENSIVE METABOLIC PANEL - Abnormal; Notable for the following components:      Result Value   CO2 20 (*)    Glucose, Bld 64 (*)    BUN 62 (*)     Creatinine, Ser 2.87 (*)    Total Bilirubin 1.9 (*)    GFR calc non Af Amer 16 (*)    GFR calc Af Amer 19 (*)    All other components within normal limits  CBC WITH DIFFERENTIAL/PLATELET - Abnormal; Notable for the following components:   WBC 14.8 (*)    Hemoglobin 9.3 (*)    HCT 32.0 (*)    MCV 78.8 (*)    MCH 22.9 (*)    MCHC 29.1 (*)    RDW 21.2 (*)    Platelets 139 (*)    Neutro Abs 13.4 (*)    Lymphs Abs 0.6 (*)    All other components within normal limits  TROPONIN I - Abnormal; Notable for the following components:   Troponin I 0.03 (*)    All other components within normal limits  BRAIN NATRIURETIC PEPTIDE - Abnormal; Notable for the following components:   B Natriuretic Peptide 1,987.0 (*)    All other components within normal limits  CULTURE, BLOOD (ROUTINE X 2)  CULTURE, BLOOD (ROUTINE X 2)  SARS CORONAVIRUS 2 (HOSPITAL ORDER, [facility])  LACTIC ACID, PLASMA  LACTIC ACID, PLASMA  URINALYSIS, ROUTINE W REFLEX MICROSCOPIC    EKG EKG Interpretation  Date/Time:  Wednesday Jan 01 2019 04:29:16 EDT Ventricular Rate:  107 PR Interval:    QRS Duration: 93 QT Interval:  336 QTC Calculation: 449 R Axis:   123 Text Interpretation:  Atrial fibrillation Right axis deviation Borderline repolarization abnormality Confirmed by Milton Ferguson [phone]) on 01/01/2019 4:38:20 AM   Radiology Dg Chest Portable 1 View  Result Date: 01/01/2019 CLINICAL DATA:  Shortness of breath with fluid retention EXAM: PORTABLE CHEST 1 VIEW COMPARISON:  11/29/2018 FINDINGS: Cardiomegaly and vascular pedicle widening. Prior median sternotomy for CABG There is haziness of the lower right chest. Vascular congestion. The left lung  is relatively clear with only mild atelectasis or scarring the lingula. IMPRESSION: Cardiomegaly and vascular congestion with right pleural effusion. Electronically Signed   By: Monte Fantasia M.D.   On: 01/01/2019 04:58     Procedures Procedures (including critical care time)  Medications Ordered in ED Medications  vancomycin (VANCOCIN) IVPB 1000 mg/200 mL premix (1,000 mg Intravenous New Bag/Given 01/01/19 0547)  furosemide (LASIX) injection 40 mg (40 mg Intravenous Given 01/01/19 0546)     Initial Impression / Assessment and Plan / ED Course  I have reviewed the triage vital signs and the nursing notes.  Pertinent labs & imaging results that were available during my care of the patient were reviewed by me and considered in my medical decision making (see chart for details).    CRITICAL CARE Performed by: Milton Ferguson Total critical care time: 40 minutes Critical care time was exclusive of separately billable procedures and treating other patients. Critical care was necessary to treat or prevent imminent or life-threatening deterioration. Critical care was time spent personally by me on the following activities: development of treatment plan with patient and/or surrogate as well as nursing, discussions with consultants, evaluation of patient's response to treatment, examination of patient, obtaining history from patient or surrogate, ordering and performing treatments and interventions, ordering and review of laboratory studies, ordering and review of radiographic studies, pulse oximetry and re-evaluation of patient's condition.  Labs and x-rays reviewed.  Patient is an worsening congestive heart failure and possibly has a cellulitis to right leg. she will be admitted to medicine  Final Clinical Impressions(s) / ED Diagnoses   Final diagnoses:  None    ED Discharge Orders    None       Milton Ferguson, MD 01/01/19 [id number]

## 2019-01-01 NOTE — ED Triage Notes (Signed)
Pt reports she was admitted recently for same, reports started feeling SOB yesterday morning around 0800, family reports retained fluid to bilateral legs

## 2019-01-01 NOTE — ED Notes (Signed)
No urine sample at this time

## 2019-01-01 NOTE — ED Notes (Signed)
Date and time results received: 01/01/19 5:59 AM  (use smartphrase ".now" to insert current time)  Test: trop Critical Value: 0.03  Name of Provider Notified: [email or web address]   Orders Received? Or Actions Taken?: none at time

## 2019-01-01 NOTE — H&P (Addendum)
Patient Demographics:    Erica [name], is a 68 y.o. female  MRN: [id number]   DOB - Sep 22, 1950  Admit Date - 01/01/2019  Outpatient Primary MD for the patient is Burdine, Virgina Evener, MD   Assessment & Plan:    Active Problems:   CVA (cerebral vascular accident) ([place])   S/P CABG x 3   Type 2 diabetes with nephropathy (HCC)   Persistent atrial fibrillation   Acute exacerbation of CHF (congestive heart failure) ([place])    1)HFpEF--- patient presenting withacute on chronic combined diastolic and systolic dysfunction CHF exacerbation----very symptomatic with increased oxygen requirement as well, last known EF 25% based on echo from2/24/ 2020 at [facility] (echo done in the setting of A. Fib) , PTA was on torsemide 20 mg twice daily and Toprol-XL 50 mg  .. Weight usually fluctuates between 250 and 256, start IV Lasix 40 mg every 12 hours, daily weight and fluid input and output monitoring as ordered .Marland Kitchen Watch renal function closely... Get cardiology consult  2)Persistent Atrial Fibrillation--- previously attempted cardioversion on 12/03/2018, continue Eliquis 5 mg p.o. twice daily for secondary stroke prophylaxis, 5 mg daily for rate control, continue amiodarone 400 mg daily  3) AKI on chronic kidney disease stage III--- baseline creatinine usually around 1.7, creatinine was 2.8 on 12/27/2018 ago repeat creatinine today 2.87.... Monitor renal function closely with IV diuresis, consider renal consult  4) ischemic cardiomyopathy CAD--- status post CABG in March 2014, please see #1 above... Last known EF 25%  5) chronic anemia----patient hemoglobin usually around 8 hemoglobin today is 9.3 monitor closely    6)H/o Prior CVA and HLD--- patient with residual right-sided hemiparesis from prior stroke, continue Eliquis for secondary stroke  prophylaxis, continue Lipitor  7) acute on chronic hypoxic respiratory failure secondary to #1 above  8) morbid obesity --- this complicates overall care... Lifestyle and dietary modifications discussed  9)DM2-- .  Continue linagliptin and insulin 70/30, check A1c, Use Novolog/Humalog Sliding scale insulin with Accu-Cheks/Fingersticks as ordered  10) right lower extremity cellulitis--- IV Rocephin as ordered, WBC is above 14, cultures pending  With History of - Reviewed by me  Past Medical History:  Diagnosis Date  . Atrial fibrillation ([place])    a. diagnosed in 09/2018  . CAD (coronary artery disease)    a. s/p CABG in 10/2012  . Carcinoid tumor   . Chest pain   . CHF (congestive heart failure) (HCC)    a. EF previously 40%, at 50% by repeat imaging in 03/2018  . Colon cancer ([place]) 2008  . DM (diabetes mellitus) ([place])   . HTN (hypertension)   . MI (myocardial infarction) ([place]) FEB 2014   Acute MI of anterior wall  . Pulmonary edema   . Stroke [facility]) 2010 RIGHT SIDED WEAKNESS      Past Surgical History:  Procedure Laterality Date  . BACTERIAL OVERGROWTH TEST N/A 03/18/2015   Procedure: BACTERIAL OVERGROWTH TEST;  Surgeon: Marga Melnick  Fields, MD;  Location: [facility];  Service: Endoscopy;  Laterality: N/A;  0800  . CARDIAC CATHETERIZATION  08/2008  . CARDIOVERSION N/A 12/03/2018   Procedure: CARDIOVERSION;  Surgeon: Satira Sark, MD;  Location: [facility] END[facility] SUITE;  Service: Cardiovascular;  Laterality: N/A;  . CHOLECYSTECTOMY    . COLONOSCOPY N/A 03/03/2015   SLF: 1. One colon polyp removed 2. Mild diverticulosis in the sigmoid colon 3. Small internal hemorrhoids  . CORONARY ARTERY BYPASS GRAFT  10/08/2012  . SHOULDER SURGERY    . TUBAL LIGATION       Chief Complaint  Patient presents with  . Shortness of Breath      HPI:    Erica [name]  is a 68 y.o. female with past medical history of CAD (s/p CABG in 10/2012), carotid artery stenosis, chronic combined systolic and  diastolic CHF (EF previously 40%, at 50% by repeat imaging in 03/2018), HTN, HLD, IDDM, Stage 3 CKD, and prior CVA presents with worsening orthopnea, increasing bilateral lower extremity edema, worsening shortness of breath, worsening dyspnea on exertion----O2 requirement has increased to 4 L from 2 L,...  Patient states she has been compliant with medications and diet,  No pleuritic symptoms, no frank chest pains,... No fevers or chills no productive cough  In ED... She is found to be in A. fib with RVR with worsening hypoxia BNP is 1987 which is higher than previous baseline, chest x-ray consistent with right-sided pleural effusion and congestive heart failure... Troponin 0 0.03, creatinine is up to 3.87 from a previous baseline of 1.7.Marland KitchenMarland Kitchen Please note the creatinine was also 2.8 about 5 days ago    Review of systems:    In addition to the HPI above,   A full Review of  Systems was done, all other systems reviewed are negative except as noted above in HPI , .    Social History:  Reviewed by me    Social History   Tobacco Use  . Smoking status: Never Smoker  . Smokeless tobacco: Never Used  Substance Use Topics  . Alcohol use: No    Alcohol/week: 0.0 standard drinks       Family History :  Reviewed by me    Family History  Problem Relation Age of Onset  . Diabetes Mother   . Heart disease Mother   . Colon cancer Paternal Uncle   . Colon polyps Neg Hx      Home Medications:   Prior to Admission medications   Medication Sig Start Date End Date Taking? Authorizing Provider  amiodarone (PACERONE) 200 MG tablet Take 400 mg by mouth daily. 12/05/18  Yes [provider]  atorvastatin (LIPITOR) 20 MG tablet Take 1 tablet (20 mg total) by mouth daily at 6 PM. 12/05/18  Yes Barton Dubois, MD  cholecalciferol (VITAMIN D) 1000 UNITS tablet Take 1,000 Units by mouth daily.   Yes [provider]  colestipol (COLESTID) 1 g tablet TAKE 1 TABLET BY MOUTH 30 MINUTES  BEFORE BREAKFAST AND SUPPER Patient taking differently: Take 1 g by mouth daily.  05/16/16  Yes Gill, Eric A, NP  ELIQUIS 5 MG TABS tablet Take 5 mg by mouth 2 (two) times daily. 10/07/18  Yes [provider]  insulin NPH-regular (NOVOLIN 70/30) (70-30) 100 UNIT/ML injection Inject 15-20 Units into the skin See admin instructions. Inject 20 units every morning and 15 units nightly.   Yes [provider]  Magnesium 400 MG CAPS Take 1 capsule by mouth daily. 11/12/13  Yes Herminio Commons, MD  metoprolol succinate (TOPROL-XL) 50 MG 24 hr tablet Take 75 mg by mouth daily. Take with or immediately following a meal. 12/10/18  Yes Herminio Commons, MD  Multiple Vitamin (MULTIVITAMIN) tablet Take 1 tablet by mouth daily.   Yes [provider]  nitroGLYCERIN (NITROSTAT) 0.4 MG SL tablet Place 1 tablet (0.4 mg total) under the tongue every 5 (five) minutes as needed for chest pain. 06/05/17  Yes Herminio Commons, MD  omeprazole (PRILOSEC) 20 MG capsule TAKE 1 CAPSULE BY MOUTH EVERY DAY Patient taking differently: Take 20 mg by mouth daily.  04/17/16  Yes Mahala Menghini, PA-C  potassium chloride SA (K-DUR) 20 MEQ tablet Take 1 tablet (20 mEq total) by mouth daily. 12/06/18  Yes Barton Dubois, MD  tiZANidine (ZANAFLEX) 4 MG tablet Take 4 mg by mouth at bedtime.    Yes [provider]  torsemide (DEMADEX) 20 MG tablet Take 1 tablet (20 mg total) by mouth daily. 12/18/18  Yes Herminio Commons, MD  travoprost, benzalkonium, (TRAVATAN) 0.004 % ophthalmic solution Place 1 drop into both eyes at bedtime.   Yes [provider]  linagliptin (TRADJENTA) 5 MG TABS tablet Take 1 tablet (5 mg total) by mouth daily. Patient not taking: Reported on 01/01/2019 12/05/18   Barton Dubois, MD     Allergies:     Allergies  Allergen Reactions  . Bee Venom   . Tradjenta [Linagliptin]   . Codeine Nausea Only and Other (See Comments)    Other reaction(s): Other (See  Comments) headache     Physical Exam:   Vitals  Blood pressure (!) 126/54, pulse 87, temperature 98.8 F (37.1 C), temperature source Oral, resp. rate 20, height 5' (1.524 m), weight 113.9 kg, SpO2 98 %.  Physical Examination: General appearance - alert, obese appearing, with conversational dyspnea Mental status - alert, oriented to person, place, and time,  Eyes - sclera anicteric Neck - supple, no JVD elevation , Chest -diminished especially on the right, bibasilar rales Heart - S1 and S2 normal, irregularly irregular  abdomen - soft, nontender, nondistended, increased truncal adiposity  neurological - screening mental status exam normal, neck supple without rigidity, cranial nerves II through XII intact, DTR's normal and symmetric Extremities -2+ pitting pedal edema noted, intact peripheral pulses  Skin -right lower extremity erythema without streaking, no open wounds no drainage,     Data Review:    CBC Recent Labs  Lab 01/01/19 0517  WBC 14.8*  HGB 9.3*  HCT 32.0*  PLT 139*  MCV 78.8*  MCH 22.9*  MCHC 29.1*  RDW 21.2*  LYMPHSABS 0.6*  MONOABS 0.7  EOSABS 0.0  BASOSABS 0.0   ------------------------------------------------------------------------------------------------------------------  Chemistries  Recent Labs  Lab 12/27/18 0813 01/01/19 0517  NA 139 139  K 4.8 5.1  CL 104 104  CO2 26 20*  GLUCOSE 141* 64*  BUN 49* 62*  CREATININE 2.80* 2.87*  CALCIUM 9.0 9.1  AST  --  25  ALT  --  17  ALKPHOS  --  92  BILITOT  --  1.9*   ------------------------------------------------------------------------------------------------------------------ estimated creatinine clearance is 22.2 mL/min (A) (by C-G formula based on SCr of 2.87 mg/dL (H)). ------------------------------------------------------------------------------------------------------------------ No results for input(s): TSH, T4TOTAL, T3FREE, THYROIDAB in the last 72 hours.  Invalid input(s):  FREET3   Coagulation profile No results for input(s): INR, PROTIME in the last 168 hours. ------------------------------------------------------------------------------------------------------------------- No results for input(s): DDIMER in the last 72 hours. -------------------------------------------------------------------------------------------------------------------  Cardiac  Enzymes Recent Labs  Lab 01/01/19 0517  TROPONINI 0.03*   ------------------------------------------------------------------------------------------------------------------    Component Value Date/Time   BNP 1,987.0 (H) 01/01/2019 0517     ---------------------------------------------------------------------------------------------------------------  Urinalysis    Component Value Date/Time   COLORURINE YELLOW 01/01/2019 0433   APPEARANCEUR HAZY (A) 01/01/2019 0433   LABSPEC 1.009 01/01/2019 0433   PHURINE 5.0 01/01/2019 0433   GLUCOSEU NEGATIVE 01/01/2019 0433   HGBUR LARGE (A) 01/01/2019 0433   BILIRUBINUR NEGATIVE 01/01/2019 0433   KETONESUR NEGATIVE 01/01/2019 0433   PROTEINUR NEGATIVE 01/01/2019 0433   UROBILINOGEN 0.2 11/05/2008 0627   NITRITE NEGATIVE 01/01/2019 0433   LEUKOCYTESUR NEGATIVE 01/01/2019 0433    ----------------------------------------------------------------------------------------------------------------   Imaging Results:    Dg Chest Portable 1 View  Result Date: 01/01/2019 CLINICAL DATA:  Shortness of breath with fluid retention EXAM: PORTABLE CHEST 1 VIEW COMPARISON:  11/29/2018 FINDINGS: Cardiomegaly and vascular pedicle widening. Prior median sternotomy for CABG There is haziness of the lower right chest. Vascular congestion. The left lung is relatively clear with only mild atelectasis or scarring the lingula. IMPRESSION: Cardiomegaly and vascular congestion with right pleural effusion. Electronically Signed   By: Monte Fantasia M.D.   On: 01/01/2019 04:58     Radiological Exams on Admission: Dg Chest Portable 1 View  Result Date: 01/01/2019 CLINICAL DATA:  Shortness of breath with fluid retention EXAM: PORTABLE CHEST 1 VIEW COMPARISON:  11/29/2018 FINDINGS: Cardiomegaly and vascular pedicle widening. Prior median sternotomy for CABG There is haziness of the lower right chest. Vascular congestion. The left lung is relatively clear with only mild atelectasis or scarring the lingula. IMPRESSION: Cardiomegaly and vascular congestion with right pleural effusion. Electronically Signed   By: Monte Fantasia M.D.   On: 01/01/2019 04:58    DVT Prophylaxis -SCD   AM Labs Ordered, also please review Full Orders  Family Communication: Admission, patients condition and plan of care including tests being ordered have been discussed with the patient who indicate understanding and agree with the plan   Code Status - Full Code  Likely DC to  home  Condition stable  Roxan Hockey M.D on 01/01/2019 at 6:43 PM Go to [email or web address] -  for contact info  [facility] - Office  [phone]

## 2019-01-01 NOTE — ED Notes (Signed)
Notified Dr. Denton Brick about CBG of 57. Pt was given orange juice. Per MD, after pt goes to floor, ensure she eats breakfast and recheck CBG an hour after eating.

## 2019-01-01 NOTE — Progress Notes (Signed)
CRITICAL VALUE ALERT  Critical Value:  Blood culture aerobic gram positive cocci  Date & Time Notied:  01/01/2019 1842  Provider Notified: Dr. Denton Brick  Orders Received/Actions taken: no orders given at this time

## 2019-01-02 ENCOUNTER — Observation Stay (HOSPITAL_COMMUNITY): Payer: Medicare HMO

## 2019-01-02 DIAGNOSIS — E785 Hyperlipidemia, unspecified: Secondary | ICD-10-CM | POA: Diagnosis present

## 2019-01-02 DIAGNOSIS — E1122 Type 2 diabetes mellitus with diabetic chronic kidney disease: Secondary | ICD-10-CM | POA: Diagnosis present

## 2019-01-02 DIAGNOSIS — E1121 Type 2 diabetes mellitus with diabetic nephropathy: Secondary | ICD-10-CM | POA: Diagnosis not present

## 2019-01-02 DIAGNOSIS — Z20828 Contact with and (suspected) exposure to other viral communicable diseases: Secondary | ICD-10-CM | POA: Diagnosis present

## 2019-01-02 DIAGNOSIS — I509 Heart failure, unspecified: Secondary | ICD-10-CM | POA: Diagnosis not present

## 2019-01-02 DIAGNOSIS — Z885 Allergy status to narcotic agent status: Secondary | ICD-10-CM | POA: Diagnosis not present

## 2019-01-02 DIAGNOSIS — I5023 Acute on chronic systolic (congestive) heart failure: Secondary | ICD-10-CM | POA: Diagnosis not present

## 2019-01-02 DIAGNOSIS — I10 Essential (primary) hypertension: Secondary | ICD-10-CM | POA: Diagnosis not present

## 2019-01-02 DIAGNOSIS — E782 Mixed hyperlipidemia: Secondary | ICD-10-CM | POA: Diagnosis not present

## 2019-01-02 DIAGNOSIS — I130 Hypertensive heart and chronic kidney disease with heart failure and stage 1 through stage 4 chronic kidney disease, or unspecified chronic kidney disease: Secondary | ICD-10-CM | POA: Diagnosis present

## 2019-01-02 DIAGNOSIS — Z951 Presence of aortocoronary bypass graft: Secondary | ICD-10-CM | POA: Diagnosis not present

## 2019-01-02 DIAGNOSIS — N179 Acute kidney failure, unspecified: Secondary | ICD-10-CM | POA: Diagnosis present

## 2019-01-02 DIAGNOSIS — B955 Unspecified streptococcus as the cause of diseases classified elsewhere: Secondary | ICD-10-CM | POA: Diagnosis present

## 2019-01-02 DIAGNOSIS — N189 Chronic kidney disease, unspecified: Secondary | ICD-10-CM | POA: Diagnosis not present

## 2019-01-02 DIAGNOSIS — R7881 Bacteremia: Secondary | ICD-10-CM | POA: Diagnosis not present

## 2019-01-02 DIAGNOSIS — I255 Ischemic cardiomyopathy: Secondary | ICD-10-CM | POA: Diagnosis present

## 2019-01-02 DIAGNOSIS — Z6841 Body Mass Index (BMI) 40.0 and over, adult: Secondary | ICD-10-CM | POA: Diagnosis not present

## 2019-01-02 DIAGNOSIS — I639 Cerebral infarction, unspecified: Secondary | ICD-10-CM | POA: Diagnosis not present

## 2019-01-02 DIAGNOSIS — I4819 Other persistent atrial fibrillation: Secondary | ICD-10-CM | POA: Diagnosis present

## 2019-01-02 DIAGNOSIS — K219 Gastro-esophageal reflux disease without esophagitis: Secondary | ICD-10-CM | POA: Diagnosis present

## 2019-01-02 DIAGNOSIS — I5020 Unspecified systolic (congestive) heart failure: Secondary | ICD-10-CM | POA: Diagnosis not present

## 2019-01-02 DIAGNOSIS — L03115 Cellulitis of right lower limb: Secondary | ICD-10-CM | POA: Diagnosis present

## 2019-01-02 DIAGNOSIS — C7A029 Malignant carcinoid tumor of the large intestine, unspecified portion: Secondary | ICD-10-CM | POA: Diagnosis not present

## 2019-01-02 DIAGNOSIS — J9601 Acute respiratory failure with hypoxia: Secondary | ICD-10-CM | POA: Diagnosis not present

## 2019-01-02 DIAGNOSIS — Z833 Family history of diabetes mellitus: Secondary | ICD-10-CM | POA: Diagnosis not present

## 2019-01-02 DIAGNOSIS — R0602 Shortness of breath: Secondary | ICD-10-CM | POA: Diagnosis not present

## 2019-01-02 DIAGNOSIS — I252 Old myocardial infarction: Secondary | ICD-10-CM | POA: Diagnosis not present

## 2019-01-02 DIAGNOSIS — R0989 Other specified symptoms and signs involving the circulatory and respiratory systems: Secondary | ICD-10-CM | POA: Diagnosis not present

## 2019-01-02 DIAGNOSIS — I5043 Acute on chronic combined systolic (congestive) and diastolic (congestive) heart failure: Secondary | ICD-10-CM | POA: Diagnosis present

## 2019-01-02 DIAGNOSIS — I2510 Atherosclerotic heart disease of native coronary artery without angina pectoris: Secondary | ICD-10-CM | POA: Diagnosis present

## 2019-01-02 DIAGNOSIS — I69351 Hemiplegia and hemiparesis following cerebral infarction affecting right dominant side: Secondary | ICD-10-CM | POA: Diagnosis not present

## 2019-01-02 DIAGNOSIS — N183 Chronic kidney disease, stage 3 (moderate): Secondary | ICD-10-CM | POA: Diagnosis not present

## 2019-01-02 DIAGNOSIS — Z800 Family history of malignant neoplasm of digestive organs: Secondary | ICD-10-CM | POA: Diagnosis not present

## 2019-01-02 DIAGNOSIS — D638 Anemia in other chronic diseases classified elsewhere: Secondary | ICD-10-CM | POA: Diagnosis present

## 2019-01-02 DIAGNOSIS — J9621 Acute and chronic respiratory failure with hypoxia: Secondary | ICD-10-CM | POA: Diagnosis present

## 2019-01-02 DIAGNOSIS — J90 Pleural effusion, not elsewhere classified: Secondary | ICD-10-CM | POA: Diagnosis not present

## 2019-01-02 DIAGNOSIS — A419 Sepsis, unspecified organism: Secondary | ICD-10-CM | POA: Diagnosis present

## 2019-01-02 DIAGNOSIS — L89152 Pressure ulcer of sacral region, stage 2: Secondary | ICD-10-CM | POA: Diagnosis present

## 2019-01-02 DIAGNOSIS — I25708 Atherosclerosis of coronary artery bypass graft(s), unspecified, with other forms of angina pectoris: Secondary | ICD-10-CM | POA: Diagnosis not present

## 2019-01-02 LAB — BLOOD CULTURE ID PANEL (REFLEXED)

## 2019-01-02 LAB — BASIC METABOLIC PANEL
Anion gap: 10 (ref 5–15)
BUN: 60 mg/dL — ABNORMAL HIGH (ref 8–23)
CO2: 23 mmol/L (ref 22–32)
Calcium: 8.4 mg/dL — ABNORMAL LOW (ref 8.9–10.3)
Chloride: 106 mmol/L (ref 98–111)
Creatinine, Ser: 2.69 mg/dL — ABNORMAL HIGH (ref 0.44–1.00)
GFR calc Af Amer: 21 mL/min — ABNORMAL LOW (ref >60)
GFR calc non Af Amer: 18 mL/min — ABNORMAL LOW (ref >60)
Glucose, Bld: 69 mg/dL — ABNORMAL LOW (ref 70–99)
Potassium: 4.7 mmol/L (ref 3.5–5.1)
Sodium: 139 mmol/L (ref 135–145)

## 2019-01-02 LAB — GLUCOSE, CAPILLARY
Glucose-Capillary: 62 mg/dL — ABNORMAL LOW (ref 70–99)
Glucose-Capillary: 80 mg/dL (ref 70–99)
Glucose-Capillary: 178 mg/dL — ABNORMAL HIGH (ref 70–99)
Glucose-Capillary: 196 mg/dL — ABNORMAL HIGH (ref 70–99)

## 2019-01-02 LAB — CBC
HCT: 25.8 % — ABNORMAL LOW (ref 36.0–46.0)
Hemoglobin: 7.2 g/dL — ABNORMAL LOW (ref 12.0–15.0)
MCH: 22.3 pg — ABNORMAL LOW (ref 26.0–34.0)
MCHC: 27.9 g/dL — ABNORMAL LOW (ref 30.0–36.0)
MCV: 79.9 fL — ABNORMAL LOW (ref 80.0–100.0)
Platelets: 94 10*3/uL — ABNORMAL LOW (ref 150–400)
RBC: 3.23 MIL/uL — ABNORMAL LOW (ref 3.87–5.11)
RDW: 21.2 % — ABNORMAL HIGH (ref 11.5–15.5)
WBC: 5.9 10*3/uL (ref 4.0–10.5)
nRBC: 0 % (ref 0.0–0.2)

## 2019-01-02 LAB — HEMOGLOBIN A1C
Hgb A1c MFr Bld: 5.5 % (ref 4.8–5.6)
Mean Plasma Glucose: 111.15 mg/dL

## 2019-01-02 MED ORDER — SODIUM CHLORIDE 0.9 % IV BOLUS
250.0000 mL | Freq: Once | INTRAVENOUS | Status: AC
  Administered 2019-01-02: 250 mL via INTRAVENOUS

## 2019-01-02 MED ORDER — SODIUM CHLORIDE 0.9 % IV SOLN
2.0000 g | INTRAVENOUS | Status: DC
  Administered 2019-01-03 – 2019-01-06 (×4): 2 g via INTRAVENOUS
  Filled 2019-01-02 (×6): qty 20

## 2019-01-02 MED ORDER — SODIUM CHLORIDE 0.9 % IV SOLN: 1.0000 g | Freq: Once | INTRAVENOUS | Status: DC

## 2019-01-02 MED ORDER — INSULIN ASPART 100 UNIT/ML ~~LOC~~ SOLN
0.0000 [IU] | Freq: Three times a day (TID) | SUBCUTANEOUS | Status: DC
  Administered 2019-01-02 – 2019-01-03 (×2): 2 [IU] via SUBCUTANEOUS
  Administered 2019-01-03: 3 [IU] via SUBCUTANEOUS
  Administered 2019-01-03: 1 [IU] via SUBCUTANEOUS
  Administered 2019-01-04 – 2019-01-05 (×3): 2 [IU] via SUBCUTANEOUS
  Administered 2019-01-05: 3 [IU] via SUBCUTANEOUS
  Administered 2019-01-06: 2 [IU] via SUBCUTANEOUS
  Administered 2019-01-06: 1 [IU] via SUBCUTANEOUS
  Administered 2019-01-06: 2 [IU] via SUBCUTANEOUS
  Administered 2019-01-07: 5 [IU] via SUBCUTANEOUS
  Administered 2019-01-07: 4 [IU] via SUBCUTANEOUS
  Administered 2019-01-07: 2 [IU] via SUBCUTANEOUS

## 2019-01-02 MED ORDER — SODIUM CHLORIDE 0.9 % IV SOLN
1.0000 g | Freq: Once | INTRAVENOUS | Status: AC
  Administered 2019-01-02: 1 g via INTRAVENOUS
  Filled 2019-01-02: qty 10

## 2019-01-02 MED ORDER — INSULIN ASPART 100 UNIT/ML ~~LOC~~ SOLN: 0.0000 [IU] | Freq: Every day | SUBCUTANEOUS | Status: DC

## 2019-01-02 NOTE — Progress Notes (Signed)
Inpatient Diabetes Program Recommendations  AACE/ADA: New Consensus Statement on Inpatient Glycemic Control (2015)  Target Ranges:  Prepandial:   less than 140 mg/dL      Peak postprandial:   less than 180 mg/dL (1-2 hours)      Critically ill patients:  140 - 180 mg/dL   Lab Results  Component Value Date   GLUCAP 62 (L) 01/02/2019    Review of Glycemic Control Results for Erica [name], Erica [name] (MRN [id number]) as of 01/02/2019 09:22  Ref. Range 01/01/2019 16:48 01/01/2019 21:14 01/01/2019 22:24 01/01/2019 23:06 01/02/2019 07:55  Glucose-Capillary Latest Ref Range: 70 - 99 mg/dL 89 43 (LL) 62 (L) 79 62 (L)   Diabetes history: DM2 Outpatient Diabetes medications: Novolin 70/30 insulin mix + Tradjenta 5 qd Current orders for Inpatient glycemic control:Novolog sensitive correction tid + hs 0-5 units + Tradjenta 5 mg qd  Inpatient Diabetes Program Recommendations:   Noted Novolog 70/30 and Novolog meal coverage discontinued post hypoglycemia. Will follow during hospitalization.  Thank you, Nani Gasser. Tahra Hitzeman, RN, MSN, CDE  Diabetes Coordinator Inpatient Glycemic Control Team Team Pager [phone] (8am-5pm) 01/02/2019 9:25 AM

## 2019-01-02 NOTE — Progress Notes (Signed)
PROGRESS NOTE  Erica [name]  ZHG:[id number]  DOB: 1950-12-04  DOA: 01/01/2019 PCP: Curlene Labrum, MD   Brief Admission Hx: 68 y/o female with CAD, chronic combined systolic and diastolic CHF, HTN, HLD, IDDM, Stage 3 CKD, and prior CVA presented with cellulitis of right leg and acute CHF exacerbation.   MDM/Assessment & Plan:   1. Acute on chronic combined systolic / diastolic CHF - continue IV lasix for diuresis, she is starting to feel a little better, monitor renal function, monitor weights. Cardiology consult pending.  2. Persistent Atrial Fibrillation - Pt was in RVR on arrival but now rate is controlled, follow telemetry, continue apixaban.   3. Ischemic cardiomyopathy - EF 25%.  4. Anemia of chronic disease - following CBC.   5. History of prior CVA - continue apixaban/atorvastatin.  6. Type 2 DM - with hypoglycemia - DC 70/30 and prandial insulin, continue SSI.  7. RLE cellulitis - continue ceftriaxone, increase to 2 gm daily given bacteremia 8. Gram positive bacteremia - secondary to cellulitis, increased ceftriaxone to 2 gm daily.   DVT prophylaxis: apixaban Code Status: Full  Family Communication: pt says she preferred to speak with daughter Disposition Plan: inpatient   Consultants:  cardiology  Procedures:    Antimicrobials:  Ceftriaxone 5/27 >   Subjective: Pt says she has much less pain in right leg today.  She is diuresing well with IV lasix.  She denies chest pain.  SOB is improving. She has not ambulated.    Objective: Vitals:   01/01/19 2336 01/02/19 0505 01/02/19 0631 01/02/19 1403  BP: (!) 88/40 (!) 150/118  113/70  Pulse: 82 76 (!) 58 97  Resp:  17  17  Temp:  98 F (36.7 C)    TempSrc:  Oral    SpO2:  100% 100% 100%  Weight:  115.1 kg    Height:        Intake/Output Summary (Last 24 hours) at 01/02/2019 1505 Last data filed at 01/02/2019 1962 Gross per 24 hour  Intake 1080 ml  Output 600 ml  Net 480 ml   Filed Weights    01/01/19 0432 01/02/19 0505  Weight: 113.9 kg 115.1 kg   REVIEW OF SYSTEMS  As per history otherwise all reviewed and reported negative  Exam:  General exam: awake, alert, NAD, cooperative.  Respiratory system: Clear. No increased work of breathing. Cardiovascular system: S1 & S2 heard. No JVD, murmurs, gallops, clicks or pedal edema. Gastrointestinal system: Abdomen is nondistended, soft and nontender. Normal bowel sounds heard. Central nervous system: Alert and oriented. No focal neurological deficits. Extremities: cellulitis, redness, heat RLE with pitting edema BLEs.  Data Reviewed: Basic Metabolic Panel: Recent Labs  Lab 12/27/18 0813 01/01/19 0517 01/02/19 0456  NA 139 139 139  K 4.8 5.1 4.7  CL 104 104 106  CO2 26 20* 23  GLUCOSE 141* 64* 69*  BUN 49* 62* 60*  CREATININE 2.80* 2.87* 2.69*  CALCIUM 9.0 9.1 8.4*   Liver Function Tests: Recent Labs  Lab 01/01/19 0517  AST 25  ALT 17  ALKPHOS 92  BILITOT 1.9*  PROT 6.9  ALBUMIN 3.7   No results for input(s): LIPASE, AMYLASE in the last 168 hours. No results for input(s): AMMONIA in the last 168 hours. CBC: Recent Labs  Lab 01/01/19 0517 01/02/19 0456  WBC 14.8* 5.9  NEUTROABS 13.4*  --   HGB 9.3* 7.2*  HCT 32.0* 25.8*  MCV 78.8* 79.9*  PLT 139* 94*   Cardiac  Enzymes: Recent Labs  Lab 01/01/19 0517  TROPONINI 0.03*   CBG (last 3)  Recent Labs    01/01/19 2306 01/02/19 0755 01/02/19 1155  GLUCAP 79 62* 80   Recent Results (from the past 240 hour(s))  SARS Coronavirus 2 (CEPHEID- Performed in [place] hospital lab), Hosp Order     Status: None   Collection Time: 01/01/19  4:54 AM  Result Value Ref Range Status   SARS Coronavirus 2 NEGATIVE NEGATIVE Final    Comment: (NOTE) If result is NEGATIVE SARS-CoV-2 target nucleic acids are NOT DETECTED. The SARS-CoV-2 RNA is generally detectable in upper and lower  respiratory specimens during the acute phase of infection. The lowest   concentration of SARS-CoV-2 viral copies this assay can detect is 250  copies / mL. A negative result does not preclude SARS-CoV-2 infection  and should not be used as the sole basis for treatment or other  patient management decisions.  A negative result may occur with  improper specimen collection / handling, submission of specimen other  than nasopharyngeal swab, presence of viral mutation(s) within the  areas targeted by this assay, and inadequate number of viral copies  (<250 copies / mL). A negative result must be combined with clinical  observations, patient history, and epidemiological information. If result is POSITIVE SARS-CoV-2 target nucleic acids are DETECTED. The SARS-CoV-2 RNA is generally detectable in upper and lower  respiratory specimens dur ing the acute phase of infection.  Positive  results are indicative of active infection with SARS-CoV-2.  Clinical  correlation with patient history and other diagnostic information is  necessary to determine patient infection status.  Positive results do  not rule out bacterial infection or co-infection with other viruses. If result is PRESUMPTIVE POSTIVE SARS-CoV-2 nucleic acids MAY BE PRESENT.   A presumptive positive result was obtained on the submitted specimen  and confirmed on repeat testing.  While 2019 novel coronavirus  (SARS-CoV-2) nucleic acids may be present in the submitted sample  additional confirmatory testing may be necessary for epidemiological  and / or clinical management purposes  to differentiate between  SARS-CoV-2 and other Sarbecovirus currently known to infect humans.  If clinically indicated additional testing with an alternate test  methodology [phone]) is advised. The SARS-CoV-2 RNA is generally  detectable in upper and lower respiratory sp ecimens during the acute  phase of infection. The expected result is Negative. Fact Sheet for Patients:  StrictlyIdeas.no Fact Sheet  for Healthcare Providers: BankingDealers.co.za This test is not yet approved or cleared by the [place] FDA and has been authorized for detection and/or diagnosis of SARS-CoV-2 by FDA under an Emergency Use Authorization (EUA).  This EUA will remain in effect (meaning this test can be used) for the duration of the COVID-19 declaration under Section 564(b)(1) of the Act, 21 U.S.C. section 360bbb-3(b)(1), unless the authorization is terminated or revoked sooner. Performed at [facility], [address], [place], [place] [address]   Blood Culture (routine x 2)     Status: None (Preliminary result)   Collection Time: 01/01/19  5:17 AM  Result Value Ref Range Status   Specimen Description   Final    BLOOD RIGHT ARM Performed at [facility], [address] [place], [place] [address]    Special Requests   Final    BOTTLES DRAWN AEROBIC AND ANAEROBIC Blood Culture adequate volume Performed at [facility], [address], [place], [place] [address]    Culture  Setup Time   Final  GRAM POSITIVE COCCI Gram Stain Report Called to,Read Back By and Verified With: TAYLOR,M ON 01/01/19 AT 1840 BY LOY,C AEROBIC BOTTLE PERFORMED AT [facility]    Culture GRAM POSITIVE COCCI  Final   Report Status PENDING  Incomplete  Blood Culture ID Panel (Reflexed)     Status: Abnormal   Collection Time: 01/01/19  5:17 AM  Result Value Ref Range Status   Enterococcus species NOT DETECTED NOT DETECTED Final   Listeria monocytogenes NOT DETECTED NOT DETECTED Final   Staphylococcus species NOT DETECTED NOT DETECTED Final   Staphylococcus aureus (BCID) NOT DETECTED NOT DETECTED Final   Streptococcus species DETECTED (A) NOT DETECTED Final    Comment: Not Enterococcus species, Streptococcus agalactiae, Streptococcus pyogenes, or Streptococcus pneumoniae. CRITICAL RESULT CALLED TO, READ BACK BY AND VERIFIED WITH: RN T BINES @0230  01/02/19 BY S GEZAHEGN    Streptococcus agalactiae NOT DETECTED  NOT DETECTED Final   Streptococcus pneumoniae NOT DETECTED NOT DETECTED Final   Streptococcus pyogenes NOT DETECTED NOT DETECTED Final   Acinetobacter baumannii NOT DETECTED NOT DETECTED Final   Enterobacteriaceae species NOT DETECTED NOT DETECTED Final   Enterobacter cloacae complex NOT DETECTED NOT DETECTED Final   Escherichia coli NOT DETECTED NOT DETECTED Final   Klebsiella oxytoca NOT DETECTED NOT DETECTED Final   Klebsiella pneumoniae NOT DETECTED NOT DETECTED Final   Proteus species NOT DETECTED NOT DETECTED Final   Serratia marcescens NOT DETECTED NOT DETECTED Final   Haemophilus influenzae NOT DETECTED NOT DETECTED Final   Neisseria meningitidis NOT DETECTED NOT DETECTED Final   Pseudomonas aeruginosa NOT DETECTED NOT DETECTED Final   Candida albicans NOT DETECTED NOT DETECTED Final   Candida glabrata NOT DETECTED NOT DETECTED Final   Candida krusei NOT DETECTED NOT DETECTED Final   Candida parapsilosis NOT DETECTED NOT DETECTED Final   Candida tropicalis NOT DETECTED NOT DETECTED Final    Comment: Performed at [facility], [place] [address], [place], [place] [address]  Blood Culture (routine x 2)     Status: None (Preliminary result)   Collection Time: 01/01/19  5:27 AM  Result Value Ref Range Status   Specimen Description BLOOD LEFT ARM  Final   Special Requests   Final    BOTTLES DRAWN AEROBIC ONLY Blood Culture results may not be optimal due to an inadequate volume of blood received in culture bottles   Culture   Final    NO GROWTH 1 DAY Performed at [facility], [address], [place], [place] [address]    Report Status PENDING  Incomplete     Studies: Dg Chest Port 1 View  Result Date: 01/02/2019 CLINICAL DATA:  Worsening shortness of breath. EXAM: PORTABLE CHEST 1 VIEW COMPARISON:  01/01/2019. FINDINGS: Prior median sternotomy. Cardiomegaly. Diffuse bilateral pulmonary infiltrates/edema again noted. Small right pleural effusion again noted. No interim change.  No pneumothorax. IMPRESSION: Prior median sternotomy. Cardiomegaly with diffuse bilateral pulmonary infiltrates/edema and small right pleural effusion again noted. Findings suggest CHF. No change noted from prior exam. Electronically Signed   By: Marcello Moores  Register   On: 01/02/2019 08:00   Dg Chest Portable 1 View  Result Date: 01/01/2019 CLINICAL DATA:  Shortness of breath with fluid retention EXAM: PORTABLE CHEST 1 VIEW COMPARISON:  11/29/2018 FINDINGS: Cardiomegaly and vascular pedicle widening. Prior median sternotomy for CABG There is haziness of the lower right chest. Vascular congestion. The left lung is relatively clear with only mild atelectasis or scarring the lingula. IMPRESSION: Cardiomegaly and vascular congestion with right pleural effusion.  Electronically Signed   By: Monte Fantasia M.D.   On: 01/01/2019 04:58     Scheduled Meds: . amiodarone  400 mg Oral Daily  . apixaban  5 mg Oral BID  . atorvastatin  20 mg Oral q1800  . cholecalciferol  1,000 Units Oral Daily  . colestipol  1 g Oral Daily  . furosemide  40 mg Intravenous Q12H  . insulin aspart  0-5 Units Subcutaneous QHS  . insulin aspart  0-9 Units Subcutaneous TID WC  . latanoprost  1 drop Both Eyes QHS  . linagliptin  5 mg Oral Daily  . magnesium oxide  400 mg Oral Daily  . metoprolol succinate  50 mg Oral Daily  . multivitamin with minerals  1 tablet Oral Daily  . pantoprazole  40 mg Oral Daily  . potassium chloride SA  20 mEq Oral Daily  . sodium chloride flush  3 mL Intravenous Q12H  . tiZANidine  4 mg Oral QHS   Continuous Infusions: . sodium chloride    . [START ON 01/03/2019] cefTRIAXone (ROCEPHIN)  IV      Active Problems:   CVA (cerebral vascular accident) ([place])   S/P CABG x 3   Type 2 diabetes with nephropathy (HCC)   Acute on chronic systolic (congestive) heart failure (HCC)   Persistent atrial fibrillation   Acute exacerbation of CHF (congestive heart failure) ([place])  Time spent:   Irwin Brakeman, MD [facility] 01/02/2019, 3:05 PM    LOS: 0 days  How to contact the [facility] Attending or Consulting provider [place] or covering provider during after hours [place], for this patient?  1. Check the care team in [facility] and look for a) attending/consulting TRH provider listed and b) the [facility] team listed 2. Log into [email or web address] and use [place]'s universal password to access. If you do not have the password, please contact the hospital operator. 3. Locate the [facility] provider you are looking for under [facility] and page to a number that you can be directly reached. 4. If you still have difficulty reaching the provider, please page the [facility] (Director on Call) for the Hospitalists listed on amion for assistance.

## 2019-01-02 NOTE — TOC Initial Note (Signed)
Transition of Care [facility]) - Initial/Assessment Note    Patient Details  Name: Erica [name] MRN: [id number] Date of Birth: 04-Dec-1950  Transition [facility]) CM/SW Contact:    Bryer Gottsch, Chauncey [place], RN Phone Number: 01/02/2019, 3:47 PM  Clinical Narrative:  Admit with CHF. From home alone, has support from son and daughter. Has continuous oxygen with [employer]. Active with [facility] for RN, PT and aide services. Has cane at home, uses when walking outside. Has PCP, was driving but lately has daughter drive her to appts. Will resume HH at time of DC. Will need DC summary and orders faxed to [facility].                  Expected Discharge Plan: [place] Barriers to Discharge: No Barriers Identified     Expected Discharge Plan and Services Expected Discharge Plan: [place]   Discharge Planning Services: CM Consult Post Acute Care Choice: Home Health, Resumption of Svcs/PTA Provider   Expected Discharge Date: 01/03/19                   Prior Living Arrangements/Services   Lives with:: Self Patient language and need for interpreter reviewed:: Yes Do you feel safe going back to the place where you live?: Yes      Need for Family Participation in Patient Care: Yes (Comment) Care giver support system in place?: Yes (comment) Current home services: DME, Home RN, Homehealth aide, Home PT(cane, oxygen) Criminal Activity/Legal Involvement Pertinent to Current Situation/Hospitalization: No - Comment as needed  Activities of Daily Living Home Assistive Devices/Equipment: Walker (specify type) ADL Screening (condition at time of admission) Patient's cognitive ability adequate to safely complete daily activities?: Yes Is the patient deaf or have difficulty hearing?: No Does the patient have difficulty seeing, even when wearing glasses/contacts?: No Does the patient have difficulty concentrating, remembering, or making decisions?: No Patient able to  express need for assistance with ADLs?: No Does the patient have difficulty dressing or bathing?: No Independently performs ADLs?: Yes (appropriate for developmental age) Does the patient have difficulty walking or climbing stairs?: No Weakness of Legs: Both Weakness of Arms/Hands: Right  Permission Sought/Granted   Permission granted to share information with : Yes, Verbal Permission Granted     Permission granted to share info w AGENCY: [facility]        Affect (typically observed): Accepting        Admission diagnosis:  Cellulitis of right leg [N46.270] Systolic congestive heart failure, unspecified HF chronicity ([place]) [I50.20] Patient Active Problem List   Diagnosis Date Noted  . Acute exacerbation of CHF (congestive heart failure) ([place]) 01/01/2019  . Hypoxia   . Acute renal failure superimposed on stage 3 chronic kidney disease ([place])   . Persistent atrial fibrillation   . Acute on chronic systolic (congestive) heart failure ([place]) 11/29/2018  . Type 2 diabetes with nephropathy ([place]) 08/02/2018  . Obesity, Class III, BMI 40-49.9 (morbid obesity) ([place]) 08/02/2018  . GERD (gastroesophageal reflux disease) 04/14/2015  . Diarrhea 12/10/2013  . Carcinoid tumor   . CVA (cerebral vascular accident) ([place]) 03/07/2013  . 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  . Hyperlipidemia LDL goal <70 04/30/2009  . OVERWEIGHT/OBESITY 04/30/2009  . Essential hypertension 04/30/2009  . CAD, NATIVE VESSEL 04/30/2009  . BRUIT 04/30/2009   PCP:  Curlene Labrum, MD Pharmacy:   [facility] [place] [address] [place] [address]  Phone: [phone] Fax: [phone]     Social Determinants of Health (SDOH) Interventions    Readmission Risk Interventions Readmission Risk Prevention Plan 01/02/2019 12/05/2018  Transportation Screening Complete Complete  PCP or Specialist Appt within 5-7 Days - Complete  Home Care Screening Complete Complete  Medication Review (RN  CM) Complete Complete  Some recent data might be hidden

## 2019-01-03 ENCOUNTER — Inpatient Hospital Stay (HOSPITAL_COMMUNITY): Payer: Medicare HMO

## 2019-01-03 DIAGNOSIS — I5023 Acute on chronic systolic (congestive) heart failure: Secondary | ICD-10-CM

## 2019-01-03 LAB — CULTURE, BLOOD (ROUTINE X 2): Special Requests: ADEQUATE

## 2019-01-03 LAB — GLUCOSE, CAPILLARY
Glucose-Capillary: 144 mg/dL — ABNORMAL HIGH (ref 70–99)
Glucose-Capillary: 200 mg/dL — ABNORMAL HIGH (ref 70–99)
Glucose-Capillary: 230 mg/dL — ABNORMAL HIGH (ref 70–99)
Glucose-Capillary: 175 mg/dL — ABNORMAL HIGH (ref 70–99)

## 2019-01-03 LAB — BASIC METABOLIC PANEL
Anion gap: 10 (ref 5–15)
BUN: 58 mg/dL — ABNORMAL HIGH (ref 8–23)
CO2: 25 mmol/L (ref 22–32)
Calcium: 8.4 mg/dL — ABNORMAL LOW (ref 8.9–10.3)
Chloride: 103 mmol/L (ref 98–111)
Creatinine, Ser: 2.42 mg/dL — ABNORMAL HIGH (ref 0.44–1.00)
GFR calc Af Amer: 23 mL/min — ABNORMAL LOW (ref >60)
GFR calc non Af Amer: 20 mL/min — ABNORMAL LOW (ref >60)
Glucose, Bld: 184 mg/dL — ABNORMAL HIGH (ref 70–99)
Potassium: 4.3 mmol/L (ref 3.5–5.1)
Sodium: 138 mmol/L (ref 135–145)

## 2019-01-03 MED ORDER — FUROSEMIDE 10 MG/ML IJ SOLN
40.0000 mg | Freq: Once | INTRAMUSCULAR | Status: AC
  Administered 2019-01-03: 40 mg via INTRAVENOUS

## 2019-01-03 MED ORDER — INSULIN ASPART 100 UNIT/ML ~~LOC~~ SOLN
4.0000 [IU] | Freq: Three times a day (TID) | SUBCUTANEOUS | Status: DC
  Administered 2019-01-03 – 2019-01-07 (×12): 4 [IU] via SUBCUTANEOUS

## 2019-01-03 MED ORDER — FUROSEMIDE 10 MG/ML IJ SOLN
80.0000 mg | Freq: Two times a day (BID) | INTRAMUSCULAR | Status: DC
  Administered 2019-01-03 – 2019-01-07 (×7): 80 mg via INTRAVENOUS
  Filled 2019-01-03 (×10): qty 8

## 2019-01-03 MED ORDER — AMIODARONE HCL 200 MG PO TABS
200.0000 mg | ORAL_TABLET | Freq: Every day | ORAL | Status: DC
  Administered 2019-01-03 – 2019-01-05 (×3): 200 mg via ORAL
  Filled 2019-01-03 (×3): qty 1

## 2019-01-03 MED ORDER — AMIODARONE HCL 200 MG PO TABS: 200.0000 mg | ORAL_TABLET | Freq: Every day | ORAL | Status: DC

## 2019-01-03 NOTE — TOC Progression Note (Signed)
Transition of Care [facility]) - Progression Note    Patient Details  Name: Erica [name] MRN: [id number] Date of Birth: 14-Jul-1951  Transition of Care [facility]) CM/SW Contact  Quinzell Malcomb, Chauncey Reading, RN Phone Number: 01/03/2019, 2:27 PM  Clinical Narrative:   Damaris Schooner with Baker Janus from [facility]. Discussed potential DC this weekend. Faxed HH resumption order to [facility]. They will plan to see patient Monday. F/u MD appt made. Will need to fax DC summary to [facility].     Expected Discharge Plan: [place] Barriers to Discharge: No Barriers Identified  Expected Discharge Plan and Services Expected Discharge Plan: [place]   Discharge Planning Services: CM Consult Post Acute Care Choice: Home Health, Resumption of Svcs/PTA Provider   Expected Discharge Date: 01/03/19                           Readmission Risk Interventions Readmission Risk Prevention Plan 01/03/2019 01/02/2019 12/05/2018  Transportation Screening - Complete Complete  PCP or Specialist Appt within 5-7 Days - - Complete  PCP or Specialist Appt within 3-5 Days Complete - -  Home Care Screening - Complete Complete  Medication Review (RN CM) - Complete Complete  HRI or Home Care Consult Complete - -  Social Work Consult for Recovery Care Planning/Counseling Complete - -  Palliative Care Screening Not Applicable - -  Medication Review (RN Care Manager) Complete - -  Some recent data might be hidden

## 2019-01-03 NOTE — Care Management Important Message (Signed)
Important Message  Patient Details  Name: Erica [name] MRN: [id number] Date of Birth: [birth year]   Medicare Important Message Given:  Yes    [name] 01/03/2019, 2:25 PM

## 2019-01-03 NOTE — Consult Note (Addendum)
Cardiology Consult    Patient ID: Erica [name]; [id number]; Nov 23, 1950   Admit date: 01/01/2019 Date of Consult: 01/03/2019  Primary Care Provider: Curlene Labrum, MD Primary Cardiologist: Kate Sable, MD   Patient Profile    Erica [name] is a 68 y.o. female with past medical history of CAD (s/p CABG in 10/2012), carotid artery stenosis, chronic combined systolic and diastolic CHF (EF previously 40%, 50% by repeat imaging in 03/2018, at 25% by echo in 09/2018),persistent atrial fibrillation (diagnosed in 09/2018, on Eliquis),HTN, HLD, IDDM,Stage 3 CKD,and prior CVA who is being seen today for the evaluation of CHF at the request of Dr. Wynetta Emery.   History of Present Illness    Ms. [name] was most recently admitted to [facility] from 4/24 - 12/05/2018 for a CHF exacerbation. She diuresed over 7 L during admission with a discharge weight of 242 lbs. Was diuresed with IV Lasix 80mg  BID and this was transitioned to Torsemide 20mg  BID at discharge. In regards to her atrial fibrillation, she did undergo DCCV on 4/28 by Dr. Domenic Polite but maintained NSR for less than 2 hours. She was started on Amiodarone 200mg  BID for 1 week with then instructions to reduce to 200mg  daily afterwards with consideration of DCCV in the future following Amiodarone load.    She had a telehealth visit with Dr. Bronson Ing on 12/10/2018 and reported still using oxygen supplementation but was not doing any strenuous activities. HR had been stable in the 80's to 90's per her report. Toprol-XL was increased to 75mg  daily and no other changes were made to her regimen. Creatinine was elevated to 3.09 when checked on 5/11 and Torsemide was held for 2 days then resumed at 20mg  daily. Her creatinine had actually improved to 2.80 on 5/22 but it was recommended she hold Torsemide at that time.  She presented to [facility] ED on 01/01/2019 for progressive dyspnea on exertion and worsening edema for the past few days. She had  previously been using 2L [place] but had to increase with to 4L. She denied any associated chest pain or palpitations. No recent fever or chills.   Initial labs showed WBC 14.8, Hgb 9.3, platelets 139, Na+ 139, K+ 5.1, and creatinine 2.87. BNP 1987. Initial troponin 0.03. Blood cultures positive for streptococcus. COVID testing negative. CXR showed cardiomegaly and vascular congestion with right pleural effusion. EKG showed atrial fibrillation, HR 107.  She has been started on IV Lasix 40mg  BID with minimal recorded output of -297 mL and weight listed as increasing from 251 lbs to 253 lbs. BMET on 5/28 showed creatinine was improving to 2.69 with repeat labs pending for today.    Past Medical History:  Diagnosis Date  . Atrial fibrillation ([place])    a. diagnosed in 09/2018  . CAD (coronary artery disease)    a. s/p CABG in 10/2012  . Carcinoid tumor   . Chest pain   . CHF (congestive heart failure) (H[facility])    a. EF previously 40%, at 50% by repeat imaging in 03/2018  . Colon cancer ([place]) 2008  . DM (diabetes mellitus) ([place])   . HTN (hypertension)   . MI (myocardial infarction) ([place]) FEB 2014   Acute MI of anterior wall  . Pulmonary edema   . Stroke [facility]) 2010 RIGHT SIDED WEAKNESS    Past Surgical History:  Procedure Laterality Date  . BACTERIAL OVERGROWTH TEST N/A 03/18/2015   Procedure: BACTERIAL OVERGROWTH TEST;  Surgeon: Danie Binder, MD;  Location: [facility]  ENDO SUITE;  Service: Endoscopy;  Laterality: N/A;  0800  . CARDIAC CATHETERIZATION  08/2008  . CARDIOVERSION N/A 12/03/2018   Procedure: CARDIOVERSION;  Surgeon: Satira Sark, MD;  Location: [facility] ENDO SUITE;  Service: Cardiovascular;  Laterality: N/A;  . CHOLECYSTECTOMY    . COLONOSCOPY N/A 03/03/2015   SLF: 1. One colon polyp removed 2. Mild diverticulosis in the sigmoid colon 3. Small internal hemorrhoids  . CORONARY ARTERY BYPASS GRAFT  10/08/2012  . SHOULDER SURGERY    . TUBAL LIGATION       Home Medications:  Prior to  Admission medications   Medication Sig Start Date End Date Taking? Authorizing Provider  amiodarone (PACERONE) 200 MG tablet Take 400 mg by mouth daily. 12/05/18  Yes [provider]  atorvastatin (LIPITOR) 20 MG tablet Take 1 tablet (20 mg total) by mouth daily at 6 PM. 12/05/18  Yes Barton Dubois, MD  cholecalciferol (VITAMIN D) 1000 UNITS tablet Take 1,000 Units by mouth daily.   Yes [provider]  colestipol (COLESTID) 1 g tablet TAKE 1 TABLET BY MOUTH 30 MINUTES BEFORE BREAKFAST AND SUPPER Patient taking differently: Take 1 g by mouth daily.  05/16/16  Yes Gill, Eric A, NP  ELIQUIS 5 MG TABS tablet Take 5 mg by mouth 2 (two) times daily. 10/07/18  Yes [provider]  insulin NPH-regular (NOVOLIN 70/30) (70-30) 100 UNIT/ML injection Inject 15-20 Units into the skin See admin instructions. Inject 20 units every morning and 15 units nightly.   Yes [provider]  Magnesium 400 MG CAPS Take 1 capsule by mouth daily. 11/12/13  Yes Herminio Commons, MD  metoprolol succinate (TOPROL-XL) 50 MG 24 hr tablet Take 75 mg by mouth daily. Take with or immediately following a meal. 12/10/18  Yes Herminio Commons, MD  Multiple Vitamin (MULTIVITAMIN) tablet Take 1 tablet by mouth daily.   Yes [provider]  nitroGLYCERIN (NITROSTAT) 0.4 MG SL tablet Place 1 tablet (0.4 mg total) under the tongue every 5 (five) minutes as needed for chest pain. 06/05/17  Yes Herminio Commons, MD  omeprazole (PRILOSEC) 20 MG capsule TAKE 1 CAPSULE BY MOUTH EVERY DAY Patient taking differently: Take 20 mg by mouth daily.  04/17/16  Yes Mahala Menghini, PA-C  potassium chloride SA (K-DUR) 20 MEQ tablet Take 1 tablet (20 mEq total) by mouth daily. 12/06/18  Yes Barton Dubois, MD  tiZANidine (ZANAFLEX) 4 MG tablet Take 4 mg by mouth at bedtime.    Yes [provider]  torsemide (DEMADEX) 20 MG tablet Take 1 tablet (20 mg total) by mouth daily. 12/18/18  Yes Herminio Commons, MD  travoprost, benzalkonium, (TRAVATAN) 0.004 % ophthalmic solution Place 1 drop into both eyes at bedtime.   Yes [provider]  linagliptin (TRADJENTA) 5 MG TABS tablet Take 1 tablet (5 mg total) by mouth daily. Patient not taking: Reported on 01/01/2019 12/05/18   Barton Dubois, MD    Inpatient Medications: Scheduled Meds: . amiodarone  400 mg Oral Daily  . apixaban  5 mg Oral BID  . atorvastatin  20 mg Oral q1800  . cholecalciferol  1,000 Units Oral Daily  . colestipol  1 g Oral Daily  . furosemide  40 mg Intravenous Q12H  . insulin aspart  0-5 Units Subcutaneous QHS  . insulin aspart  0-9 Units Subcutaneous TID WC  . latanoprost  1 drop Both Eyes QHS  . magnesium oxide  400 mg Oral Daily  . metoprolol succinate  50 mg Oral Daily  . multivitamin with minerals  1 tablet Oral Daily  . pantoprazole  40 mg Oral Daily  . potassium chloride SA  20 mEq Oral Daily  . sodium chloride flush  3 mL Intravenous Q12H  . tiZANidine  4 mg Oral QHS   Continuous Infusions: . sodium chloride    . cefTRIAXone (ROCEPHIN)  IV     PRN Meds: sodium chloride, acetaminophen **OR** acetaminophen, albuterol, nitroGLYCERIN, ondansetron **OR** ondansetron (ZOFRAN) IV, polyethylene glycol, sodium chloride flush, traZODone  Allergies:    Allergies  Allergen Reactions  . Bee Venom   . Tradjenta [Linagliptin]   . Codeine Nausea Only and Other (See Comments)    Other reaction(s): Other (See Comments) headache    Social History:   Social History   Socioeconomic History  . Marital status: Divorced    Spouse name: Not on file  . Number of children: Not on file  . Years of education: Not on file  . Highest education level: Not on file  Occupational History  . Not on file  Social Needs  . Financial resource strain: Not on file  . Food insecurity:    Worry: Not on file    Inability: Not on file  . Transportation needs:    Medical: Not on file    Non-medical: Not on file   Tobacco Use  . Smoking status: Never Smoker  . Smokeless tobacco: Never Used  Substance and Sexual Activity  . Alcohol use: No    Alcohol/week: 0.0 standard drinks  . Drug use: No  . Sexual activity: Not on file  Lifestyle  . Physical activity:    Days per week: Not on file    Minutes per session: Not on file  . Stress: Not on file  Relationships  . Social connections:    Talks on phone: Not on file    Gets together: Not on file    Attends religious service: Not on file    Active member of club or organization: Not on file    Attends meetings of clubs or organizations: Not on file    Relationship status: Not on file  . Intimate partner violence:    Fear of current or ex partner: Not on file    Emotionally abused: Not on file    Physically abused: Not on file    Forced sexual activity: Not on file  Other Topics Concern  . Not on file  Social History Narrative  . Not on file     Family History:    Family History  Problem Relation Age of Onset  . Diabetes Mother   . Heart disease Mother   . Colon cancer Paternal Uncle   . Colon polyps Neg Hx       Review of Systems    General:  No chills, fever, night sweats or weight changes.  Cardiovascular:  No chest pain, orthopnea, palpitations, paroxysmal nocturnal dyspnea. Positive for dyspnea on exertion and edema.  Dermatological: No rash, lesions/masses Respiratory: No cough, dyspnea Urologic: No hematuria, dysuria Abdominal:   No nausea, vomiting, diarrhea, bright red blood per rectum, melena, or hematemesis Neurologic:  No visual changes, wkns, changes in mental status. All other systems reviewed and are otherwise negative except as noted above.  Physical Exam/Data    Vitals:   01/02/19 0631 01/02/19 1403 01/02/19 1923 01/02/19 2144  BP:  113/70  (!) 123/109  Pulse: (!) 58 97  95  Resp:  17  16  Temp:  98.2 F (36.8 C)  TempSrc:    Oral  SpO2: 100% 100% 98% 100%  Weight:      Height:         Intake/Output Summary (Last 24 hours) at 01/03/2019 0833 Last data filed at 01/03/2019 0500 Gross per 24 hour  Intake 843 ml  Output 1000 ml  Net -157 ml   Filed Weights   01/01/19 0432 01/02/19 0505  Weight: 113.9 kg 115.1 kg   Body mass index is 49.56 kg/m.   General: Pleasant obese Caucasian female appearing in NAD Psych: Normal affect. Neuro: Alert and oriented X 3. Moves all extremities spontaneously. HEENT: Normal  Neck: Supple without bruits.  Lungs:  Resp regular and unlabored, decreased along bases bilaterally.  Heart: Irregularly irregular. No s3, s4, or murmurs. Abdomen: Soft, non-tender, non-distended, BS + x 4.  Extremities: No clubbing or cyanosis. Pitting edema bilaterally. DP/PT/Radials 2+ and equal bilaterally.   Labs/Studies     Relevant CV Studies:  Echocardiogram: 09/2018 Technically difficult study due to chest wall/lung interference  Severely decreased left ventricular systolic function, ejection fraction  25%  Segmental wall abnormalities (please see below)  Dilated right ventricle - mild  Mildly decreased right ventricular systolic function  Elevated pulmonary artery systolic pressure - mild  Laboratory Data:  Chemistry Recent Labs  Lab 01/01/19 0517 01/02/19 0456  NA 139 139  K 5.1 4.7  CL 104 106  CO2 20* 23  GLUCOSE 64* 69*  BUN 62* 60*  CREATININE 2.87* 2.69*  CALCIUM 9.1 8.4*  GFRNONAA 16* 18*  GFRAA 19* 21*  ANIONGAP 15 10    Recent Labs  Lab 01/01/19 0517  PROT 6.9  ALBUMIN 3.7  AST 25  ALT 17  ALKPHOS 92  BILITOT 1.9*   Hematology Recent Labs  Lab 01/01/19 0517 01/02/19 0456  WBC 14.8* 5.9  RBC 4.06 3.23*  HGB 9.3* 7.2*  HCT 32.0* 25.8*  MCV 78.8* 79.9*  MCH 22.9* 22.3*  MCHC 29.1* 27.9*  RDW 21.2* 21.2*  PLT 139* 94*   Cardiac Enzymes Recent Labs  Lab 01/01/19 0517  TROPONINI 0.03*   No results for input(s): TROPIPOC in the last 168 hours.  BNP Recent Labs  Lab 01/01/19 0517  BNP 1,987.0*     DDimer No results for input(s): DDIMER in the last 168 hours.  Radiology/Studies:  Dg Chest Port 1 View  Result Date: 01/02/2019 CLINICAL DATA:  Worsening shortness of breath. EXAM: PORTABLE CHEST 1 VIEW COMPARISON:  01/01/2019. FINDINGS: Prior median sternotomy. Cardiomegaly. Diffuse bilateral pulmonary infiltrates/edema again noted. Small right pleural effusion again noted. No interim change. No pneumothorax. IMPRESSION: Prior median sternotomy. Cardiomegaly with diffuse bilateral pulmonary infiltrates/edema and small right pleural effusion again noted. Findings suggest CHF. No change noted from prior exam. Electronically Signed   By: Marcello Moores  Register   On: 01/02/2019 08:00   Dg Chest Portable 1 View  Result Date: 01/01/2019 CLINICAL DATA:  Shortness of breath with fluid retention EXAM: PORTABLE CHEST 1 VIEW COMPARISON:  11/29/2018 FINDINGS: Cardiomegaly and vascular pedicle widening. Prior median sternotomy for CABG There is haziness of the lower right chest. Vascular congestion. The left lung is relatively clear with only mild atelectasis or scarring the lingula. IMPRESSION: Cardiomegaly and vascular congestion with right pleural effusion. Electronically Signed   By: Monte Fantasia M.D.   On: 01/01/2019 04:58     Assessment & Plan    1. Acute on Chronic Combined Systolic and Diastolic CHF - she has a known reduced EF  of 25% by echocardiogram in 09/2018 which was thought to possibly be tachycardia-induced in the setting of atrial fibrillation with RVR.  - presented with worsening dyspnea on exertion and edema, with weight at 251 lbs on admission (discharge weight of 242 lbs last month). BNP elevated to 1987 and CXR consistent with CHF.  - she has been started on IV Lasix 40mg  BID with minimal recorded output of -297 mL and weight listed as increasing from 251 lbs to 253 lbs. Given her degree of renal dysfunction, suspect Lasix will need to be further titrated to a minimum of 60mg  BID and  likely 80mg  BID as she responded well to this last admission. Will review with Dr. Harl Bowie. Given her recurrent CHF exacerbations, would also consider obtaining a repeat limited echo for reassessment of LV function and atrial enlargement.  - continue Toprol-XL. Not on ACE-I/ARB/ARNI secondary to AKI.   2. Persistent Atrial Fibrillation - underwent DCCV last admission but only maintained NSR for a few hours. She had been on Amiodarone 200mg  daily as an outpatient but this was restarted at 400mg  daily on admission (?). Pending degree of atrial enlargement, this would help determine a rate vs. rhythm control strategy but given her comorbidities, I suspect she will have a high recurrence rate.  - remains on Toprol-XL 50mg  daily for rate-control. Was titrated to 75mg  daily during prior office visit but BP has been soft this admission. Would continue to follow HR and BP and can adjust back to 75mg  daily dosing if BP allows.  - continue Eliquis for anticoagulation.   3. CAD - s/p CABG in 10/2012. She denies any recent chest pain. Has been experiencing dyspnea on exertion as outlined above. Initial troponin flat at 0.03. - continue BB and statin therapy. No longer on ASA given the need for anticoagulation.   4. Acute on Chronic Stage 3 CKD -  Previous baseline creatinine of 1.6 - 1.7. Peaked at 3.09 on 5/11 and at 2.80 on admission. Improved to 2.69 on 5/28 with repeat labs pending this AM. Continue to follow daily BMET while receiving IV Lasix.   5. Bacteremia - blood cultures positive for streptococcus. Has been started on Ceftriaxone per the admitting team.   For questions or updates, please contact [place] Please consult [email or web address] for contact info under Cardiology/STEMI.  Signed, Erma Heritage, PA-C 01/03/2019, 8:33 AM Pager: [phone]   Attending note Patient seen and discussed with PA Ahmed Prima, I agree with her documentation above. 68 yo female history of persistent afib s/p  DCCV back into afib now on amio, CAD with prior CABG, chronic diastolic HF, HTN, DM2, prior CVA, CKD 3, chronic systolic HF LVEF 27% (down from 50% in 03/2018). From Dr Marthann Schiller note plans for medical therapy and repeat echo, if LVEF remains reduced plan for lexiscan, limited on cath due to renal dysfunction.   Presented with worsening LE edema, SOB, orthopnea. Increased home O2 requirement from 2L to 4L. In ER found to be in afib with RVR  K 5.1 Cr 2.87 BUN 62 WBC 14.8 Hgb 9.3 Plt 139 BNP 1987  Blood cx's: +strep Trop 0.03--> COVID 2 neg CXR +vascular congestion EKG afib, RAD probable lead reversal, inferior and lateral TWIs Discharge weight 12/05/18 110 kg   Presents with acute on chronic systolic HF. Discharge weight 110 kg in April, on admit 114 kg.  - she is on lasix 40mg  iv bid, incomplete I/Os data. Weights appear unreliable as she was up 2 kg with diuresis based  on yesterdays weight. Labs are pending today, has had some downtrend in Cr with diuresis since admission with diuresis. We will increase her lasix to 80mg  IV bid. Heart failure medical therapy has been limited due to renal dysfunction - anticipate will be here over weekend for IV diuresis   Afib - continue amio, eliquis, toprol. Amio started during 12/03/2018 after cardioversion that day where she shortly went back into afib  - plans were for amio oral loading and likely repeat DCCV in the future - discharged on 400mg  bid x 1 week, then 400mg  daily - she has completed her 10g amio load, change to 200mg  daily - may consider repeat DCCV this admission after diuresis, if fails then would be indication to stop amio and just commit to long term rate control. Defer to Dr Bronson Ing who will be rounding Monday.  - make npo Sunday night in case decision made for cardioversion on Monday  AKI on CKD - mild downtrend in Cr with diuresis, continue to monitor  Cellulitis/bacteremia - abx per primary team - potential exacerbating factor for  her HF     Carlyle Dolly MD

## 2019-01-03 NOTE — Progress Notes (Signed)
PROGRESS NOTE  Erica [name]  GTX:[id number]  DOB: 02/18/1951  DOA: 01/01/2019 PCP: Curlene Labrum, MD  Brief Admission Hx: 68 y/o female with CAD, chronic combined systolic and diastolic CHF, HTN, HLD, IDDM, Stage 3 CKD, and prior CVA presented with cellulitis of right leg and acute CHF exacerbation.   MDM/Assessment & Plan:   1. Acute on chronic combined systolic / diastolic CHF - continue IV lasix for diuresis, she is starting to feel a little better, monitor renal function, monitor weights. Cardiology consult appreciated.   2. Persistent Atrial Fibrillation - Pt was in RVR on arrival but now rate is controlled, follow telemetry, continue apixaban.  NPO after MN on Sunday in case cardioversion considered on Monday by heart care team.   3. Cardiomyopathy - EF 25% followed closely by [facility] team.  4. Anemia of chronic disease - following CBC.   5. History of prior CVA - continue apixaban/atorvastatin.  6. Type 2 DM - with hypoglycemia - DC 70/30 and prandial insulin, continue SSI.  7. RLE cellulitis - continue ceftriaxone 2 gm daily 8. Streptoccoccus bacteremia - secondary to cellulitis, continue ceftriaxone to 2 gm daily.   DVT prophylaxis: apixaban Code Status: Full  Family Communication: pt says she preferred to speak with and update daughter Disposition Plan: inpatient  Consultants:  cardiology  Procedures:    Antimicrobials:  Ceftriaxone 5/27 >   Subjective: She denies palpitations and chest pain.  SOB is improving. She has not ambulated.    Objective: Vitals:   01/02/19 1403 01/02/19 1923 01/02/19 2144 01/03/19 1416  BP: 113/70  (!) 123/109 118/61  Pulse: 97  95 90  Resp: 17  16 17   Temp:   98.2 F (36.8 C) 97.7 F (36.5 C)  TempSrc:   Oral Oral  SpO2: 100% 98% 100% 100%  Weight:      Height:        Intake/Output Summary (Last 24 hours) at 01/03/2019 1543 Last data filed at 01/03/2019 1500 Gross per 24 hour  Intake 483 ml  Output 1450 ml  Net  -967 ml   Filed Weights   01/01/19 0432 01/02/19 0505  Weight: 113.9 kg 115.1 kg   REVIEW OF SYSTEMS  As per history otherwise all reviewed and reported negative  Exam:  General exam: awake, alert, NAD, cooperative.  Respiratory system: Clear. No increased work of breathing. Cardiovascular system: S1 & S2 heard. No JVD, murmurs, gallops, clicks or pedal edema. Gastrointestinal system: Abdomen is nondistended, soft and nontender. Normal bowel sounds heard. Central nervous system: Alert and oriented. No focal neurological deficits. Extremities: cellulitis, redness, heat RLE with pitting edema BLEs.  Data Reviewed: Basic Metabolic Panel: Recent Labs  Lab 01/01/19 0517 01/02/19 0456 01/03/19 0856  NA 139 139 138  K 5.1 4.7 4.3  CL 104 106 103  CO2 20* 23 25  GLUCOSE 64* 69* 184*  BUN 62* 60* 58*  CREATININE 2.87* 2.69* 2.42*  CALCIUM 9.1 8.4* 8.4*   Liver Function Tests: Recent Labs  Lab 01/01/19 0517  AST 25  ALT 17  ALKPHOS 92  BILITOT 1.9*  PROT 6.9  ALBUMIN 3.7   No results for input(s): LIPASE, AMYLASE in the last 168 hours. No results for input(s): AMMONIA in the last 168 hours. CBC: Recent Labs  Lab 01/01/19 0517 01/02/19 0456  WBC 14.8* 5.9  NEUTROABS 13.4*  --   HGB 9.3* 7.2*  HCT 32.0* 25.8*  MCV 78.8* 79.9*  PLT 139* 94*   Cardiac Enzymes: Recent  Labs  Lab 01/01/19 0517  TROPONINI 0.03*   CBG (last 3)  Recent Labs    01/02/19 2142 01/03/19 0719 01/03/19 1125  GLUCAP 196* 144* 200*   Recent Results (from the past 240 hour(s))  SARS Coronavirus 2 (CEPHEID- Performed in [facility] hospital lab), Hosp Order     Status: None   Collection Time: 01/01/19  4:54 AM  Result Value Ref Range Status   SARS Coronavirus 2 NEGATIVE NEGATIVE Final    Comment: (NOTE) If result is NEGATIVE SARS-CoV-2 target nucleic acids are NOT DETECTED. The SARS-CoV-2 RNA is generally detectable in upper and lower  respiratory specimens during the acute phase  of infection. The lowest  concentration of SARS-CoV-2 viral copies this assay can detect is 250  copies / mL. A negative result does not preclude SARS-CoV-2 infection  and should not be used as the sole basis for treatment or other  patient management decisions.  A negative result may occur with  improper specimen collection / handling, submission of specimen other  than nasopharyngeal swab, presence of viral mutation(s) within the  areas targeted by this assay, and inadequate number of viral copies  (<250 copies / mL). A negative result must be combined with clinical  observations, patient history, and epidemiological information. If result is POSITIVE SARS-CoV-2 target nucleic acids are DETECTED. The SARS-CoV-2 RNA is generally detectable in upper and lower  respiratory specimens dur ing the acute phase of infection.  Positive  results are indicative of active infection with SARS-CoV-2.  Clinical  correlation with patient history and other diagnostic information is  necessary to determine patient infection status.  Positive results do  not rule out bacterial infection or co-infection with other viruses. If result is PRESUMPTIVE POSTIVE SARS-CoV-2 nucleic acids MAY BE PRESENT.   A presumptive positive result was obtained on the submitted specimen  and confirmed on repeat testing.  While 2019 novel coronavirus  (SARS-CoV-2) nucleic acids may be present in the submitted sample  additional confirmatory testing may be necessary for epidemiological  and / or clinical management purposes  to differentiate between  SARS-CoV-2 and other Sarbecovirus currently known to infect humans.  If clinically indicated additional testing with an alternate test  methodology [id number]) is advised. The SARS-CoV-2 RNA is generally  detectable in upper and lower respiratory sp ecimens during the acute  phase of infection. The expected result is Negative. Fact Sheet for Patients:   StrictlyIdeas.no Fact Sheet for Healthcare Providers: BankingDealers.co.za This test is not yet approved or cleared by the [place] FDA and has been authorized for detection and/or diagnosis of SARS-CoV-2 by FDA under an Emergency Use Authorization (EUA).  This EUA will remain in effect (meaning this test can be used) for the duration of the COVID-19 declaration under Section 564(b)(1) of the Act, 21 U.S.C. section 360bbb-3(b)(1), unless the authorization is terminated or revoked sooner. Performed at [facility], [address], [place], [place] [address]   Blood Culture (routine x 2)     Status: Abnormal   Collection Time: 01/01/19  5:17 AM  Result Value Ref Range Status   Specimen Description   Final    BLOOD RIGHT ARM Performed at [facility], [address] [place], [place] [address]    Special Requests   Final    BOTTLES DRAWN AEROBIC AND ANAEROBIC Blood Culture adequate volume Performed at [facility], [address], [place], [place] [address]    Culture  Setup Time   Final    GRAM POSITIVE  [facility] Gram Stain Report Called to,Read Back By and Verified With: TAYLOR,M ON 01/01/19 AT 1840 BY LOY,C AEROBIC BOTTLE PERFORMED AT [facility]    Culture STREPTOCOCCUS GROUP G (A)  Final   Report Status 01/03/2019 FINAL  Final   Organism ID, Bacteria STREPTOCOCCUS GROUP G  Final      Susceptibility   Streptococcus group g - MIC*    CLINDAMYCIN RESISTANT Resistant     AMPICILLIN <=0.25 SENSITIVE Sensitive     ERYTHROMYCIN >=8 RESISTANT Resistant     VANCOMYCIN 0.5 SENSITIVE Sensitive     CEFTRIAXONE <=0.12 SENSITIVE Sensitive     LEVOFLOXACIN 0.5 SENSITIVE Sensitive     * STREPTOCOCCUS GROUP G  Blood Culture ID Panel (Reflexed)     Status: Abnormal   Collection Time: 01/01/19  5:17 AM  Result Value Ref Range Status   Enterococcus species NOT DETECTED NOT DETECTED Final   Listeria monocytogenes NOT DETECTED NOT DETECTED Final    Staphylococcus species NOT DETECTED NOT DETECTED Final   Staphylococcus aureus (BCID) NOT DETECTED NOT DETECTED Final   Streptococcus species DETECTED (A) NOT DETECTED Final    Comment: Not Enterococcus species, Streptococcus agalactiae, Streptococcus pyogenes, or Streptococcus pneumoniae. CRITICAL RESULT CALLED TO, READ BACK BY AND VERIFIED WITH: RN T BINES @0230  01/02/19 BY S GEZAHEGN    Streptococcus agalactiae NOT DETECTED NOT DETECTED Final   Streptococcus pneumoniae NOT DETECTED NOT DETECTED Final   Streptococcus pyogenes NOT DETECTED NOT DETECTED Final   Acinetobacter baumannii NOT DETECTED NOT DETECTED Final   Enterobacteriaceae species NOT DETECTED NOT DETECTED Final   Enterobacter cloacae complex NOT DETECTED NOT DETECTED Final   Escherichia coli NOT DETECTED NOT DETECTED Final   Klebsiella oxytoca NOT DETECTED NOT DETECTED Final   Klebsiella pneumoniae NOT DETECTED NOT DETECTED Final   Proteus species NOT DETECTED NOT DETECTED Final   Serratia marcescens NOT DETECTED NOT DETECTED Final   Haemophilus influenzae NOT DETECTED NOT DETECTED Final   Neisseria meningitidis NOT DETECTED NOT DETECTED Final   Pseudomonas aeruginosa NOT DETECTED NOT DETECTED Final   Candida albicans NOT DETECTED NOT DETECTED Final   Candida glabrata NOT DETECTED NOT DETECTED Final   Candida krusei NOT DETECTED NOT DETECTED Final   Candida parapsilosis NOT DETECTED NOT DETECTED Final   Candida tropicalis NOT DETECTED NOT DETECTED Final    Comment: Performed at [facility], [place] [address], [place], [place] [address]  Blood Culture (routine x 2)     Status: None (Preliminary result)   Collection Time: 01/01/19  5:27 AM  Result Value Ref Range Status   Specimen Description BLOOD LEFT ARM  Final   Special Requests   Final    BOTTLES DRAWN AEROBIC ONLY Blood Culture results may not be optimal due to an inadequate volume of blood received in culture bottles   Culture   Final    NO GROWTH 2 DAYS  Performed at [facility], [address], [place], [place] [address]    Report Status PENDING  Incomplete     Studies: Dg Chest Port 1 View  Result Date: 01/03/2019 CLINICAL DATA:  Pleural effusion follow-up EXAM: PORTABLE CHEST 1 VIEW COMPARISON:  Yesterday FINDINGS: Cardiomegaly. Prior median sternotomy. Right pleural effusion causing hazy density at the base. No pulmonary edema. Atelectasis or scarring at the level of the lingula. IMPRESSION: Stable cardiomegaly and small right pleural effusion. Electronically Signed   By: Monte Fantasia M.D.   On: 01/03/2019 08:34   Dg Chest [facility] 1 View  Result  Date: 01/02/2019 CLINICAL DATA:  Worsening shortness of breath. EXAM: PORTABLE CHEST 1 VIEW COMPARISON:  01/01/2019. FINDINGS: Prior median sternotomy. Cardiomegaly. Diffuse bilateral pulmonary infiltrates/edema again noted. Small right pleural effusion again noted. No interim change. No pneumothorax. IMPRESSION: Prior median sternotomy. Cardiomegaly with diffuse bilateral pulmonary infiltrates/edema and small right pleural effusion again noted. Findings suggest CHF. No change noted from prior exam. Electronically Signed   By: [place]   On: 01/02/2019 08:00   Scheduled Meds: . Derrill Memo ON 01/04/2019] amiodarone  200 mg Oral Daily  . apixaban  5 mg Oral BID  . atorvastatin  20 mg Oral q1800  . cholecalciferol  1,000 Units Oral Daily  . colestipol  1 g Oral Daily  . furosemide  80 mg Intravenous Q12H  . insulin aspart  0-5 Units Subcutaneous QHS  . insulin aspart  0-9 Units Subcutaneous TID WC  . latanoprost  1 drop Both Eyes QHS  . magnesium oxide  400 mg Oral Daily  . metoprolol succinate  50 mg Oral Daily  . multivitamin with minerals  1 tablet Oral Daily  . pantoprazole  40 mg Oral Daily  . potassium chloride SA  20 mEq Oral Daily  . sodium chloride flush  3 mL Intravenous Q12H  . tiZANidine  4 mg Oral QHS   Continuous Infusions: . sodium chloride    . cefTRIAXone (ROCEPHIN)  IV  2 g (01/03/19 0943)    Active Problems:   CVA (cerebral vascular accident) ([place])   S/P CABG x 3   Type 2 diabetes with nephropathy (HCC)   Acute on chronic systolic (congestive) heart failure (HCC)   Persistent atrial fibrillation   Acute exacerbation of CHF (congestive heart failure) ([place])  Time spent:   Irwin Brakeman, MD [facility] 01/03/2019, 3:43 PM    LOS: 1 day  How to contact the [facility] Attending or Consulting provider [place] or covering provider during after hours [place], for this patient?  1. Check the care team in [facility] and look for a) attending/consulting [facility] provider listed and b) the [facility] team listed 2. Log into [email or web address] and use [place] universal password to access. If you do not have the password, please contact the hospital operator. 3. Locate the [facility] provider you are looking for under [facility] and page to a number that you can be directly reached. 4. If you still have difficulty reaching the provider, please page the [facility] (Director on Call) for the Hospitalists listed on amion for assistance.

## 2019-01-04 ENCOUNTER — Inpatient Hospital Stay (HOSPITAL_COMMUNITY): Payer: Medicare HMO

## 2019-01-04 DIAGNOSIS — J9601 Acute respiratory failure with hypoxia: Secondary | ICD-10-CM | POA: Diagnosis not present

## 2019-01-04 DIAGNOSIS — E782 Mixed hyperlipidemia: Secondary | ICD-10-CM | POA: Diagnosis not present

## 2019-01-04 DIAGNOSIS — I10 Essential (primary) hypertension: Secondary | ICD-10-CM | POA: Diagnosis not present

## 2019-01-04 DIAGNOSIS — I5043 Acute on chronic combined systolic (congestive) and diastolic (congestive) heart failure: Secondary | ICD-10-CM | POA: Diagnosis not present

## 2019-01-04 DIAGNOSIS — E1122 Type 2 diabetes mellitus with diabetic chronic kidney disease: Secondary | ICD-10-CM | POA: Diagnosis not present

## 2019-01-04 DIAGNOSIS — I69351 Hemiplegia and hemiparesis following cerebral infarction affecting right dominant side: Secondary | ICD-10-CM | POA: Diagnosis not present

## 2019-01-04 DIAGNOSIS — C7A029 Malignant carcinoid tumor of the large intestine, unspecified portion: Secondary | ICD-10-CM | POA: Diagnosis not present

## 2019-01-04 DIAGNOSIS — L8990 Pressure ulcer of unspecified site, unspecified stage: Secondary | ICD-10-CM

## 2019-01-04 DIAGNOSIS — N189 Chronic kidney disease, unspecified: Secondary | ICD-10-CM | POA: Diagnosis not present

## 2019-01-04 LAB — CBC
HCT: 27.6 % — ABNORMAL LOW (ref 36.0–46.0)
Hemoglobin: 7.7 g/dL — ABNORMAL LOW (ref 12.0–15.0)
MCH: 22.3 pg — ABNORMAL LOW (ref 26.0–34.0)
MCHC: 27.9 g/dL — ABNORMAL LOW (ref 30.0–36.0)
MCV: 80 fL (ref 80.0–100.0)
Platelets: 106 10*3/uL — ABNORMAL LOW (ref 150–400)
RBC: 3.45 MIL/uL — ABNORMAL LOW (ref 3.87–5.11)
RDW: 20.7 % — ABNORMAL HIGH (ref 11.5–15.5)
WBC: 4.1 10*3/uL (ref 4.0–10.5)
nRBC: 0 % (ref 0.0–0.2)

## 2019-01-04 LAB — BASIC METABOLIC PANEL
Anion gap: 13 (ref 5–15)
BUN: 57 mg/dL — ABNORMAL HIGH (ref 8–23)
CO2: 24 mmol/L (ref 22–32)
Calcium: 8.4 mg/dL — ABNORMAL LOW (ref 8.9–10.3)
Chloride: 103 mmol/L (ref 98–111)
Creatinine, Ser: 2.07 mg/dL — ABNORMAL HIGH (ref 0.44–1.00)
GFR calc Af Amer: 28 mL/min — ABNORMAL LOW (ref >60)
GFR calc non Af Amer: 24 mL/min — ABNORMAL LOW (ref >60)
Glucose, Bld: 127 mg/dL — ABNORMAL HIGH (ref 70–99)
Potassium: 4.5 mmol/L (ref 3.5–5.1)
Sodium: 140 mmol/L (ref 135–145)

## 2019-01-04 LAB — GLUCOSE, CAPILLARY
Glucose-Capillary: 108 mg/dL — ABNORMAL HIGH (ref 70–99)
Glucose-Capillary: 113 mg/dL — ABNORMAL HIGH (ref 70–99)
Glucose-Capillary: 169 mg/dL — ABNORMAL HIGH (ref 70–99)
Glucose-Capillary: 185 mg/dL — ABNORMAL HIGH (ref 70–99)
Glucose-Capillary: 165 mg/dL — ABNORMAL HIGH (ref 70–99)

## 2019-01-04 MED ORDER — SODIUM CHLORIDE 0.9 % IV SOLN
INTRAVENOUS | Status: DC | PRN
  Administered 2019-01-04: 250 mL via INTRAVENOUS

## 2019-01-04 NOTE — Progress Notes (Addendum)
PROGRESS NOTE  Erica [name]  RXV:[id number]  DOB: 1951-07-08  DOA: 01/01/2019 PCP: Curlene Labrum, MD  Brief Admission Hx: 68 y/o female with CAD, chronic combined systolic and diastolic CHF, HTN, HLD, IDDM, Stage 3 CKD, and prior CVA presented with cellulitis of right leg and acute CHF exacerbation.   MDM/Assessment & Plan:   1. Acute on chronic combined systolic / diastolic CHF - continue IV lasix for diuresis, she is starting to feel a little better, monitor renal function, monitor weights. Cardiology consult appreciated.  2. Bilateral pleural effusions - continue diuresis.   3. Persistent Atrial Fibrillation - Pt was in RVR on arrival but now rate is better controlled, follow telemetry, continue apixaban.  NPO after MN on Sunday in case cardioversion considered on Monday by heart care team.   4. Cardiomyopathy - EF 25% followed closely by [facility] team.  5. Anemia of chronic disease - following CBC.   6. History of prior CVA - continue apixaban/atorvastatin.  7. Poor IV access - Placed central line, avoiding PICC due to CKD.  8. Type 2 DM - with hypoglycemia - DC 70/30 and prandial insulin, continue SSI.  9. RLE streptococcus cellulitis - continue ceftriaxone 2 gm daily as it is improving. 10. Streptoccoccus bacteremia - secondary to cellulitis, continue ceftriaxone to 2 gm daily.  11. Sacrum pressure injury POA 12. Sepsis POA  DVT prophylaxis: apixaban Code Status: Full  Family Communication: pt says she preferred to speak with and update daughter Disposition Plan: inpatient  Consultants:  cardiology  Procedures:    Antimicrobials:  Ceftriaxone 5/27 >   Subjective: She denies palpitations and chest pain.  SOB is improving. She has not ambulated.    Objective: Vitals:   01/03/19 2114 01/04/19 0500 01/04/19 0532 01/04/19 1535  BP: 117/72  (!) 109/51 103/79  Pulse: 71  (!) 55 95  Resp: 16  17 16   Temp: 98.6 F (37 C)  97.7 F (36.5 C) (!) 97.5 F (36.4 C)   TempSrc: Oral  Oral Oral  SpO2: 100%  99% 100%  Weight:  117.4 kg    Height:        Intake/Output Summary (Last 24 hours) at 01/04/2019 1603 Last data filed at 01/04/2019 0700 Gross per 24 hour  Intake 240 ml  Output 1800 ml  Net -1560 ml   Filed Weights   01/01/19 0432 01/02/19 0505 01/04/19 0500  Weight: 113.9 kg 115.1 kg 117.4 kg   REVIEW OF SYSTEMS  As per history otherwise all reviewed and reported negative  Exam:  General exam: awake, alert, NAD, cooperative.  Respiratory system: Clear. No increased work of breathing. Cardiovascular system: S1 & S2 heard. No JVD, murmurs, gallops, clicks or pedal edema. Gastrointestinal system: Abdomen is nondistended, soft and nontender. Normal bowel sounds heard. Central nervous system: Alert and oriented. No focal neurological deficits. Extremities: cellulitis, improving redness, heat RLE with 1+ pitting edema BLEs.  Data Reviewed: Basic Metabolic Panel: Recent Labs  Lab 01/01/19 0517 01/02/19 0456 01/03/19 0856 01/04/19 0616  NA 139 139 138 140  K 5.1 4.7 4.3 4.5  CL 104 106 103 103  CO2 20* 23 25 24   GLUCOSE 64* 69* 184* 127*  BUN 62* 60* 58* 57*  CREATININE 2.87* 2.69* 2.42* 2.07*  CALCIUM 9.1 8.4* 8.4* 8.4*   Liver Function Tests: Recent Labs  Lab 01/01/19 0517  AST 25  ALT 17  ALKPHOS 92  BILITOT 1.9*  PROT 6.9  ALBUMIN 3.7   No results for  input(s): LIPASE, AMYLASE in the last 168 hours. No results for input(s): AMMONIA in the last 168 hours. CBC: Recent Labs  Lab 01/01/19 0517 01/02/19 0456 01/04/19 0616  WBC 14.8* 5.9 4.1  NEUTROABS 13.4*  --   --   HGB 9.3* 7.2* 7.7*  HCT 32.0* 25.8* 27.6*  MCV 78.8* 79.9* 80.0  PLT 139* 94* 106*   Cardiac Enzymes: Recent Labs  Lab 01/01/19 0517  TROPONINI 0.03*   CBG (last 3)  Recent Labs    01/04/19 0257 01/04/19 0714 01/04/19 1117  GLUCAP 108* 113* 169*   Recent Results (from the past 240 hour(s))  SARS Coronavirus 2 (CEPHEID- Performed in  [place] hospital lab), Hosp Order     Status: None   Collection Time: 01/01/19  4:54 AM  Result Value Ref Range Status   SARS Coronavirus 2 NEGATIVE NEGATIVE Final    Comment: (NOTE) If result is NEGATIVE SARS-CoV-2 target nucleic acids are NOT DETECTED. The SARS-CoV-2 RNA is generally detectable in upper and lower  respiratory specimens during the acute phase of infection. The lowest  concentration of SARS-CoV-2 viral copies this assay can detect is 250  copies / mL. A negative result does not preclude SARS-CoV-2 infection  and should not be used as the sole basis for treatment or other  patient management decisions.  A negative result may occur with  improper specimen collection / handling, submission of specimen other  than nasopharyngeal swab, presence of viral mutation(s) within the  areas targeted by this assay, and inadequate number of viral copies  (<250 copies / mL). A negative result must be combined with clinical  observations, patient history, and epidemiological information. If result is POSITIVE SARS-CoV-2 target nucleic acids are DETECTED. The SARS-CoV-2 RNA is generally detectable in upper and lower  respiratory specimens dur ing the acute phase of infection.  Positive  results are indicative of active infection with SARS-CoV-2.  Clinical  correlation with patient history and other diagnostic information is  necessary to determine patient infection status.  Positive results do  not rule out bacterial infection or co-infection with other viruses. If result is PRESUMPTIVE POSTIVE SARS-CoV-2 nucleic acids MAY BE PRESENT.   A presumptive positive result was obtained on the submitted specimen  and confirmed on repeat testing.  While 2019 novel coronavirus  (SARS-CoV-2) nucleic acids may be present in the submitted sample  additional confirmatory testing may be necessary for epidemiological  and / or clinical management purposes  to differentiate between  SARS-CoV-2  and other Sarbecovirus currently known to infect humans.  If clinically indicated additional testing with an alternate test  methodology [phone]) is advised. The SARS-CoV-2 RNA is generally  detectable in upper and lower respiratory sp ecimens during the acute  phase of infection. The expected result is Negative. Fact Sheet for Patients:  StrictlyIdeas.no Fact Sheet for Healthcare Providers: BankingDealers.co.za This test is not yet approved or cleared by the [place] FDA and has been authorized for detection and/or diagnosis of SARS-CoV-2 by FDA under an Emergency Use Authorization (EUA).  This EUA will remain in effect (meaning this test can be used) for the duration of the COVID-19 declaration under Section 564(b)(1) of the Act, 21 U.S.C. section 360bbb-3(b)(1), unless the authorization is terminated or revoked sooner. Performed at [facility], [address], [place], [place] [address]   Blood Culture (routine x 2)     Status: Abnormal   Collection Time: 01/01/19  5:17 AM  Result Value Ref Range Status  Specimen Description   Final    BLOOD RIGHT ARM Performed at [facility], [address] [place], [place] [address]    Special Requests   Final    BOTTLES DRAWN AEROBIC AND ANAEROBIC Blood Culture adequate volume Performed at [facility], [address] [place], [place] [address]    Culture  Setup Time   Final    GRAM POSITIVE COCCI Gram Stain Report Called to,Read Back By and Verified With: TAYLOR,M ON 01/01/19 AT 1840 BY LOY,C AEROBIC BOTTLE PERFORMED AT [facility]    Culture STREPTOCOCCUS GROUP G (A)  Final   Report Status 01/03/2019 FINAL  Final   Organism ID, Bacteria STREPTOCOCCUS GROUP G  Final      Susceptibility   Streptococcus group g - MIC*    CLINDAMYCIN RESISTANT Resistant     AMPICILLIN <=0.25 SENSITIVE Sensitive     ERYTHROMYCIN >=8 RESISTANT Resistant     VANCOMYCIN 0.5 SENSITIVE Sensitive     CEFTRIAXONE  <=0.12 SENSITIVE Sensitive     LEVOFLOXACIN 0.5 SENSITIVE Sensitive     * STREPTOCOCCUS GROUP G  Blood Culture ID Panel (Reflexed)     Status: Abnormal   Collection Time: 01/01/19  5:17 AM  Result Value Ref Range Status   Enterococcus species NOT DETECTED NOT DETECTED Final   Listeria monocytogenes NOT DETECTED NOT DETECTED Final   Staphylococcus species NOT DETECTED NOT DETECTED Final   Staphylococcus aureus (BCID) NOT DETECTED NOT DETECTED Final   Streptococcus species DETECTED (A) NOT DETECTED Final    Comment: Not Enterococcus species, Streptococcus agalactiae, Streptococcus pyogenes, or Streptococcus pneumoniae. CRITICAL RESULT CALLED TO, READ BACK BY AND VERIFIED WITH: RN T BINES @0230  01/02/19 BY S GEZAHEGN    Streptococcus agalactiae NOT DETECTED NOT DETECTED Final   Streptococcus pneumoniae NOT DETECTED NOT DETECTED Final   Streptococcus pyogenes NOT DETECTED NOT DETECTED Final   Acinetobacter baumannii NOT DETECTED NOT DETECTED Final   Enterobacteriaceae species NOT DETECTED NOT DETECTED Final   Enterobacter cloacae complex NOT DETECTED NOT DETECTED Final   Escherichia coli NOT DETECTED NOT DETECTED Final   Klebsiella oxytoca NOT DETECTED NOT DETECTED Final   Klebsiella pneumoniae NOT DETECTED NOT DETECTED Final   Proteus species NOT DETECTED NOT DETECTED Final   Serratia marcescens NOT DETECTED NOT DETECTED Final   Haemophilus influenzae NOT DETECTED NOT DETECTED Final   Neisseria meningitidis NOT DETECTED NOT DETECTED Final   Pseudomonas aeruginosa NOT DETECTED NOT DETECTED Final   Candida albicans NOT DETECTED NOT DETECTED Final   Candida glabrata NOT DETECTED NOT DETECTED Final   Candida krusei NOT DETECTED NOT DETECTED Final   Candida parapsilosis NOT DETECTED NOT DETECTED Final   Candida tropicalis NOT DETECTED NOT DETECTED Final    Comment: Performed at [facility], [place] [address], [place], [place] [address]  Blood Culture (routine x 2)     Status: None  (Preliminary result)   Collection Time: 01/01/19  5:27 AM  Result Value Ref Range Status   Specimen Description BLOOD LEFT ARM  Final   Special Requests   Final    BOTTLES DRAWN AEROBIC ONLY Blood Culture results may not be optimal due to an inadequate volume of blood received in culture bottles   Culture   Final    NO GROWTH 3 DAYS Performed at [facility], [address] [place], [place] [address]    Report Status PENDING  Incomplete     Studies: Dg Chest Port 1 View  Result Date: 01/04/2019 CLINICAL DATA:  PICC line placementPost central line placement EXAM: PORTABLE CHEST 1 VIEW COMPARISON:  01/03/2019 FINDINGS: RIGHT IJ catheter with tip in the distal SVC. Normal cardiac silhouette. Enlarged cardiac silhouette. No pneumothorax. Bilateral moderate pleural effusions. Central venous congestion IMPRESSION: 1. RIGHT central venous line in place without complication. 2. Moderate bilateral effusions and pulmonary venous congestion. Electronically Signed   By: Suzy Bouchard M.D.   On: 01/04/2019 15:00   Dg Chest Port 1 View  Result Date: 01/03/2019 CLINICAL DATA:  Pleural effusion follow-up EXAM: PORTABLE CHEST 1 VIEW COMPARISON:  Yesterday FINDINGS: Cardiomegaly. Prior median sternotomy. Right pleural effusion causing hazy density at the base. No pulmonary edema. Atelectasis or scarring at the level of the lingula. IMPRESSION: Stable cardiomegaly and small right pleural effusion. Electronically Signed   By: Monte Fantasia M.D.   On: 01/03/2019 08:34   Scheduled Meds: . amiodarone  200 mg Oral Daily  . apixaban  5 mg Oral BID  . atorvastatin  20 mg Oral q1800  . cholecalciferol  1,000 Units Oral Daily  . colestipol  1 g Oral Daily  . furosemide  80 mg Intravenous Q12H  . insulin aspart  0-5 Units Subcutaneous QHS  . insulin aspart  0-9 Units Subcutaneous TID WC  . insulin aspart  4 Units Subcutaneous TID WC  . latanoprost  1 drop Both Eyes QHS  . magnesium oxide  400 mg Oral Daily   . metoprolol succinate  50 mg Oral Daily  . multivitamin with minerals  1 tablet Oral Daily  . pantoprazole  40 mg Oral Daily  . potassium chloride SA  20 mEq Oral Daily  . sodium chloride flush  3 mL Intravenous Q12H  . tiZANidine  4 mg Oral QHS   Continuous Infusions: . sodium chloride    . cefTRIAXone (ROCEPHIN)  IV 2 g (01/03/19 0943)    Active Problems:   CVA (cerebral vascular accident) ([place])   S/P CABG x 3   Type 2 diabetes with nephropathy (HCC)   Acute on chronic systolic (congestive) heart failure (HCC)   Persistent atrial fibrillation   Acute exacerbation of CHF (congestive heart failure) (HCC)   Pressure injury of skin  Time spent:   Irwin Brakeman, MD [facility] 01/04/2019, 4:03 PM    LOS: 2 days  How to contact the [facility] Attending or Consulting provider [place] or covering provider during after hours [place], for this patient?  1. Check the care team in [facility] and look for a) attending/consulting [facility] provider listed and b) the [facility] team listed 2. Log into [email or web address] and use 's universal password to access. If you do not have the password, please contact the hospital operator. 3. Locate the [facility] provider you are looking for under [facility] and page to a number that you can be directly reached. 4. If you still have difficulty reaching the provider, please page the [facility] (Director on Call) for the Hospitalists listed on amion for assistance.

## 2019-01-04 NOTE — Evaluation (Signed)
Physical Therapy Evaluation Patient Details Name: Erica [name] MRN: [id number] DOB: 1951/04/04 Today's Date: 01/04/2019   History of Present Illness  Erica [name]  is a 68 y.o. female with past medical history of CAD (s/p CABG in 10/2012), carotid artery stenosis, chronic combined systolic and diastolic CHF (EF previously 40%, at 50% by repeat imaging in 03/2018), HTN, HLD, IDDM, Stage 3 CKD, and prior CVA presents with worsening orthopnea, increasing bilateral lower extremity edema, worsening shortness of breath, worsening dyspnea on exertion----O2 requirement has increased to 4 L from 2 L,...    Clinical Impression  Patient demonstrates very labored slow movement for sitting up at bedside requiring use of bed rail with head of bed raised, once seated patient became very SOB requiring 5-6 minutes to recover before attempting sit to stands.  Patient had to have bed raised and multiple attempts before able to stand and limited to a few labored side steps at bedside to transfer to chair.  Patient tolerated sitting up in chair after therapy - nursing staff notified.  Patient will benefit from continued physical therapy in hospital and recommended venue below to increase strength, balance, endurance for safe ADLs and gait.    Follow Up Recommendations SNF    Equipment Recommendations  None recommended by PT    Recommendations for Other Services       Precautions / Restrictions Precautions Precautions: Fall Restrictions Weight Bearing Restrictions: No      Mobility  Bed Mobility Overal bed mobility: Needs Assistance Bed Mobility: Supine to Sit     Supine to sit: Min assist     General bed mobility comments: slow labored movement, use of siderail to pull self up with much effort  Transfers Overall transfer level: Needs assistance Equipment used: Hemi-walker Transfers: Sit to/from [facility] Sit to Stand: Min assist Stand pivot transfers: Min assist        General transfer comment: required bed raised, multiple attempts before able to come to complete standing  Ambulation/Gait Ambulation/Gait assistance: Mod assist Gait Distance (Feet): 5 Feet Assistive device: Hemi-walker Gait Pattern/deviations: Decreased step length - right;Decreased stance time - right;Decreased stride length;Antalgic Gait velocity: slow   General Gait Details: limited to 5-6 very slow labored steps at bedside due to fatigue, RLE pain and weakness  Stairs            Wheelchair Mobility    Modified Rankin (Stroke Patients Only)       Balance Overall balance assessment: Needs assistance Sitting-balance support: Feet unsupported;No upper extremity supported Sitting balance-Leahy Scale: Good     Standing balance support: During functional activity;Single extremity supported Standing balance-Leahy Scale: Poor Standing balance comment: fair/poor using Hemi-walker                             Pertinent Vitals/Pain Pain Assessment: 0-10 Pain Score: 6  Pain Location: RLE when standing, none at rest Pain Descriptors / Indicators: Sore;Discomfort Pain Intervention(s): Limited activity within patient's tolerance;Monitored during session    [facility] expects to be discharged to:: Private residence Living Arrangements: Alone Available Help at Discharge: Family Type of Home: Mobile home Home Access: Stairs to enter Entrance Stairs-Rails: None Entrance Stairs-Number of Steps: 3 Home Layout: One level Home Equipment: Cane - single point;Other (comment) Additional Comments: hemi-walker, right AFO    Prior Function Level of Independence: Independent with assistive device(s)         Comments: Household and short community distanced  ambulator with SPC, drives     Hand Dominance   Dominant Hand: Left    Extremity/Trunk Assessment   Upper Extremity Assessment Upper Extremity Assessment: Generalized weakness    Lower  Extremity Assessment Lower Extremity Assessment: Generalized weakness;LLE deficits/detail;RLE deficits/detail RLE Deficits / Details: grossly -3/5 LLE Deficits / Details: 4+/5       Communication   Communication: No difficulties  Cognition Arousal/Alertness: Awake/alert Behavior During Therapy: WFL for tasks assessed/performed Overall Cognitive Status: Within Functional Limits for tasks assessed                                        General Comments      Exercises     Assessment/Plan    PT Assessment Patient needs continued PT services  PT Problem List Decreased strength;Decreased activity tolerance;Decreased balance;Decreased mobility       PT Treatment Interventions Therapeutic exercise;Gait training;Stair training;Functional mobility training;Therapeutic activities;Patient/family education    PT Goals (Current goals can be found in the Care Plan section)  Acute Rehab PT Goals Patient Stated Goal: return home after rehab PT Goal Formulation: With patient Time For Goal Achievement: 01/18/19 Potential to Achieve Goals: Good    Frequency Min 3X/week   Barriers to discharge        Co-evaluation               AM-PAC PT "6 Clicks" Mobility  Outcome Measure Help needed turning from your back to your side while in a flat bed without using bedrails?: A Lot Help needed moving from lying on your back to sitting on the side of a flat bed without using bedrails?: A Lot Help needed moving to and from a bed to a chair (including a wheelchair)?: A Lot Help needed standing up from a chair using your arms (e.g., wheelchair or bedside chair)?: A Lot Help needed to walk in hospital room?: A Lot Help needed climbing 3-5 steps with a railing? : Total 6 Click Score: 11    End of Session   Activity Tolerance: Patient tolerated treatment well;Patient limited by fatigue Patient left: in chair;with call bell/phone within reach Nurse Communication: Mobility  status PT Visit Diagnosis: Unsteadiness on feet (R26.81);Other abnormalities of gait and mobility (R26.89);Muscle weakness (generalized) (M62.81)    Time: 0017-4944 PT Time Calculation (min) (ACUTE ONLY): 25 min   Charges:   PT Evaluation $PT Eval Moderate Complexity: 1 Mod PT Treatments $Therapeutic Activity: 23-37 mins        2:03 PM, 01/04/19 Lonell Grandchild, MPT Physical Therapist with [facility] [phone] office [phone] mobile phone

## 2019-01-04 NOTE — Plan of Care (Signed)
  Problem: Acute Rehab PT Goals(only PT should resolve) Goal: Pt Will Go Supine/Side To Sit Outcome: Progressing Flowsheets (Taken 01/04/2019 1406) Pt will go Supine/Side to Sit: with min guard assist Goal: Patient Will Transfer Sit To/From Stand Outcome: Progressing Flowsheets (Taken 01/04/2019 1406) Patient will transfer sit to/from stand: with min guard assist Goal: Pt Will Transfer Bed To Chair/Chair To Bed Outcome: Progressing Flowsheets (Taken 01/04/2019 1406) Pt will Transfer Bed to Chair/Chair to Bed: min guard assist Goal: Pt Will Ambulate Outcome: Progressing Flowsheets (Taken 01/04/2019 1406) Pt will Ambulate: 25 feet; with minimal assist Note:  Using hemi-walker   2:07 PM, 01/04/19 Lonell Grandchild, MPT Physical Therapist with [facility] [phone] office [phone] mobile phone

## 2019-01-05 LAB — BASIC METABOLIC PANEL
Anion gap: 11 (ref 5–15)
BUN: 49 mg/dL — ABNORMAL HIGH (ref 8–23)
CO2: 25 mmol/L (ref 22–32)
Calcium: 8.3 mg/dL — ABNORMAL LOW (ref 8.9–10.3)
Chloride: 102 mmol/L (ref 98–111)
Creatinine, Ser: 1.7 mg/dL — ABNORMAL HIGH (ref 0.44–1.00)
GFR calc Af Amer: 36 mL/min — ABNORMAL LOW (ref >60)
GFR calc non Af Amer: 31 mL/min — ABNORMAL LOW (ref >60)
Glucose, Bld: 133 mg/dL — ABNORMAL HIGH (ref 70–99)
Potassium: 3.8 mmol/L (ref 3.5–5.1)
Sodium: 138 mmol/L (ref 135–145)

## 2019-01-05 LAB — GLUCOSE, CAPILLARY
Glucose-Capillary: 122 mg/dL — ABNORMAL HIGH (ref 70–99)
Glucose-Capillary: 110 mg/dL — ABNORMAL HIGH (ref 70–99)
Glucose-Capillary: 213 mg/dL — ABNORMAL HIGH (ref 70–99)
Glucose-Capillary: 181 mg/dL — ABNORMAL HIGH (ref 70–99)
Glucose-Capillary: 194 mg/dL — ABNORMAL HIGH (ref 70–99)

## 2019-01-05 LAB — CBC
HCT: 26.5 % — ABNORMAL LOW (ref 36.0–46.0)
Hemoglobin: 7.7 g/dL — ABNORMAL LOW (ref 12.0–15.0)
MCH: 23.1 pg — ABNORMAL LOW (ref 26.0–34.0)
MCHC: 29.1 g/dL — ABNORMAL LOW (ref 30.0–36.0)
MCV: 79.3 fL — ABNORMAL LOW (ref 80.0–100.0)
Platelets: 115 10*3/uL — ABNORMAL LOW (ref 150–400)
RBC: 3.34 MIL/uL — ABNORMAL LOW (ref 3.87–5.11)
RDW: 20.6 % — ABNORMAL HIGH (ref 11.5–15.5)
WBC: 3.5 10*3/uL — ABNORMAL LOW (ref 4.0–10.5)
nRBC: 0 % (ref 0.0–0.2)

## 2019-01-05 NOTE — Progress Notes (Signed)
PROGRESS NOTE  Erica [name]  ZSW:[id number]  DOB: 1950-09-13  DOA: 01/01/2019 PCP: Curlene Labrum, MD  Brief Admission Hx: 68 y/o female with CAD, chronic combined systolic and diastolic CHF, HTN, HLD, IDDM, Stage 3 CKD, and prior CVA presented with cellulitis of right leg and acute CHF exacerbation.   MDM/Assessment & Plan:   1. Acute on chronic combined systolic / diastolic CHF - continue IV lasix for diuresis, she is starting to feel a little better, monitor renal function, monitor weights. Cardiology consult appreciated.   She has diuresed 2.1L since admission.  2. Bilateral pleural effusions - continue diuresis.   3. Persistent Atrial Fibrillation - Pt was in RVR on arrival but now rate is better controlled, follow telemetry, continue apixaban.  NPO after MN in preparation for possible cardioversion on Monday by heart care team.   4. Cardiomyopathy - EF 25% followed closely by [facility] team.  5. Anemia of chronic disease - following CBC.   6. History of prior CVA - continue apixaban/atorvastatin.  7. Poor IV access - Placed central line, avoiding PICC due to CKD. 8. Stage 3 CKD - creatinine improved slightly with diuresis.   9. Type 2 DM - with hypoglycemia - DC 70/30 and prandial insulin, continue SSI.  10. RLE streptococcus cellulitis - continue ceftriaxone 2 gm daily as it is improving. 11. Streptoccoccus bacteremia - secondary to cellulitis, continue ceftriaxone to 2 gm daily as seems nearly resolved completely.   12. Sacrum pressure injury POA - skin care protocol.  13. Sepsis POA - resolved  DVT prophylaxis: apixaban Code Status: Full  Family Communication: pt says she preferred to speak with and update daughter Disposition Plan: inpatient  Consultants:  cardiology  Procedures:    Antimicrobials:  Ceftriaxone 5/27 >   Subjective: She says that redness and pain in right leg is nearly resolved.  SOB is improving.     Objective: Vitals:   01/04/19 2016  01/04/19 2227 01/05/19 0500 01/05/19 0548  BP:  (!) 111/58  102/65  Pulse:  74  69  Resp:  18  17  Temp:  98.3 F (36.8 C)  97.6 F (36.4 C)  TempSrc:  Oral  Oral  SpO2: 97% 100%  100%  Weight:   118.8 kg   Height:        Intake/Output Summary (Last 24 hours) at 01/05/2019 1238 Last data filed at 01/05/2019 0500 Gross per 24 hour  Intake 647.31 ml  Output 550 ml  Net 97.31 ml   Filed Weights   01/02/19 0505 01/04/19 0500 01/05/19 0500  Weight: 115.1 kg 117.4 kg 118.8 kg   REVIEW OF SYSTEMS  As per history otherwise all reviewed and reported negative  Exam:  General exam: awake, alert, NAD, cooperative.  Respiratory system: Clear. No increased work of breathing. Cardiovascular system: S1 & S2 heard. No JVD, murmurs, gallops, clicks or pedal edema. Gastrointestinal system: Abdomen is nondistended, soft and nontender. Normal bowel sounds heard. Central nervous system: Alert and oriented. No focal neurological deficits. Extremities: cellulitis, nearly completely resolved redness, heat RLE with 1+ pitting edema BLEs.  Data Reviewed: Basic Metabolic Panel: Recent Labs  Lab 01/01/19 0517 01/02/19 0456 01/03/19 0856 01/04/19 0616 01/05/19 0626  NA 139 139 138 140 138  K 5.1 4.7 4.3 4.5 3.8  CL 104 106 103 103 102  CO2 20* 23 25 24 25   GLUCOSE 64* 69* 184* 127* 133*  BUN 62* 60* 58* 57* 49*  CREATININE 2.87* 2.69* 2.42* 2.07* 1.70*  CALCIUM 9.1 8.4* 8.4* 8.4* 8.3*   Liver Function Tests: Recent Labs  Lab 01/01/19 0517  AST 25  ALT 17  ALKPHOS 92  BILITOT 1.9*  PROT 6.9  ALBUMIN 3.7   No results for input(s): LIPASE, AMYLASE in the last 168 hours. No results for input(s): AMMONIA in the last 168 hours. CBC: Recent Labs  Lab 01/01/19 0517 01/02/19 0456 01/04/19 0616 01/05/19 0626  WBC 14.8* 5.9 4.1 3.5*  NEUTROABS 13.4*  --   --   --   HGB 9.3* 7.2* 7.7* 7.7*  HCT 32.0* 25.8* 27.6* 26.5*  MCV 78.8* 79.9* 80.0 79.3*  PLT 139* 94* 106* 115*   Cardiac  Enzymes: Recent Labs  Lab 01/01/19 0517  TROPONINI 0.03*   CBG (last 3)  Recent Labs    01/05/19 0316 01/05/19 0733 01/05/19 1156  GLUCAP 122* 110* 213*   Recent Results (from the past 240 hour(s))  SARS Coronavirus 2 (CEPHEID- Performed in [facility] hospital lab), Hosp Order     Status: None   Collection Time: 01/01/19  4:54 AM  Result Value Ref Range Status   SARS Coronavirus 2 NEGATIVE NEGATIVE Final    Comment: (NOTE) If result is NEGATIVE SARS-CoV-2 target nucleic acids are NOT DETECTED. The SARS-CoV-2 RNA is generally detectable in upper and lower  respiratory specimens during the acute phase of infection. The lowest  concentration of SARS-CoV-2 viral copies this assay can detect is 250  copies / mL. A negative result does not preclude SARS-CoV-2 infection  and should not be used as the sole basis for treatment or other  patient management decisions.  A negative result may occur with  improper specimen collection / handling, submission of specimen other  than nasopharyngeal swab, presence of viral mutation(s) within the  areas targeted by this assay, and inadequate number of viral copies  (<250 copies / mL). A negative result must be combined with clinical  observations, patient history, and epidemiological information. If result is POSITIVE SARS-CoV-2 target nucleic acids are DETECTED. The SARS-CoV-2 RNA is generally detectable in upper and lower  respiratory specimens dur ing the acute phase of infection.  Positive  results are indicative of active infection with SARS-CoV-2.  Clinical  correlation with patient history and other diagnostic information is  necessary to determine patient infection status.  Positive results do  not rule out bacterial infection or co-infection with other viruses. If result is PRESUMPTIVE POSTIVE SARS-CoV-2 nucleic acids MAY BE PRESENT.   A presumptive positive result was obtained on the submitted specimen  and confirmed on repeat  testing.  While 2019 novel coronavirus  (SARS-CoV-2) nucleic acids may be present in the submitted sample  additional confirmatory testing may be necessary for epidemiological  and / or clinical management purposes  to differentiate between  SARS-CoV-2 and other Sarbecovirus currently known to infect humans.  If clinically indicated additional testing with an alternate test  methodology [phone]) is advised. The SARS-CoV-2 RNA is generally  detectable in upper and lower respiratory sp ecimens during the acute  phase of infection. The expected result is Negative. Fact Sheet for Patients:  StrictlyIdeas.no Fact Sheet for Healthcare Providers: BankingDealers.co.za This test is not yet approved or cleared by the [place] FDA and has been authorized for detection and/or diagnosis of SARS-CoV-2 by FDA under an Emergency Use Authorization (EUA).  This EUA will remain in effect (meaning this test can be used) for the duration of the COVID-19 declaration under Section 564(b)(1) of the Act, 21 U.S.C.  section 360bbb-3(b)(1), unless the authorization is terminated or revoked sooner. Performed at  [facility], [address] [place], [place] [address]   Blood Culture (routine x 2)     Status: Abnormal   Collection Time: 01/01/19  5:17 AM  Result Value Ref Range Status   Specimen Description   Final    BLOOD RIGHT ARM Performed at [facility], [address], [place], [place] [address]    Special Requests   Final    BOTTLES DRAWN AEROBIC AND ANAEROBIC Blood Culture adequate volume Performed at [facility], [address] [place], [place] [address]    Culture  Setup Time   Final    GRAM POSITIVE COCCI Gram Stain Report Called to,Read Back By and Verified With: TAYLOR,M ON 01/01/19 AT 1840 BY LOY,C AEROBIC BOTTLE PERFORMED AT [facility]    Culture STREPTOCOCCUS GROUP G (A)  Final   Report Status 01/03/2019 FINAL  Final   Organism ID, Bacteria  STREPTOCOCCUS GROUP G  Final      Susceptibility   Streptococcus group g - MIC*    CLINDAMYCIN RESISTANT Resistant     AMPICILLIN <=0.25 SENSITIVE Sensitive     ERYTHROMYCIN >=8 RESISTANT Resistant     VANCOMYCIN 0.5 SENSITIVE Sensitive     CEFTRIAXONE <=0.12 SENSITIVE Sensitive     LEVOFLOXACIN 0.5 SENSITIVE Sensitive     * STREPTOCOCCUS GROUP G  Blood Culture ID Panel (Reflexed)     Status: Abnormal   Collection Time: 01/01/19  5:17 AM  Result Value Ref Range Status   Enterococcus species NOT DETECTED NOT DETECTED Final   Listeria monocytogenes NOT DETECTED NOT DETECTED Final   Staphylococcus species NOT DETECTED NOT DETECTED Final   Staphylococcus aureus (BCID) NOT DETECTED NOT DETECTED Final   Streptococcus species DETECTED (A) NOT DETECTED Final    Comment: Not Enterococcus species, Streptococcus agalactiae, Streptococcus pyogenes, or Streptococcus pneumoniae. CRITICAL RESULT CALLED TO, READ BACK BY AND VERIFIED WITH: RN T BINES @0230  01/02/19 BY S GEZAHEGN    Streptococcus agalactiae NOT DETECTED NOT DETECTED Final   Streptococcus pneumoniae NOT DETECTED NOT DETECTED Final   Streptococcus pyogenes NOT DETECTED NOT DETECTED Final   Acinetobacter baumannii NOT DETECTED NOT DETECTED Final   Enterobacteriaceae species NOT DETECTED NOT DETECTED Final   Enterobacter cloacae complex NOT DETECTED NOT DETECTED Final   Escherichia coli NOT DETECTED NOT DETECTED Final   Klebsiella oxytoca NOT DETECTED NOT DETECTED Final   Klebsiella pneumoniae NOT DETECTED NOT DETECTED Final   Proteus species NOT DETECTED NOT DETECTED Final   Serratia marcescens NOT DETECTED NOT DETECTED Final   Haemophilus influenzae NOT DETECTED NOT DETECTED Final   Neisseria meningitidis NOT DETECTED NOT DETECTED Final   Pseudomonas aeruginosa NOT DETECTED NOT DETECTED Final   Candida albicans NOT DETECTED NOT DETECTED Final   Candida glabrata NOT DETECTED NOT DETECTED Final   Candida krusei NOT DETECTED NOT  DETECTED Final   Candida parapsilosis NOT DETECTED NOT DETECTED Final   Candida tropicalis NOT DETECTED NOT DETECTED Final    Comment: Performed at [facility], [place] [address] [place], [place] [address]  Blood Culture (routine x 2)     Status: None (Preliminary result)   Collection Time: 01/01/19  5:27 AM  Result Value Ref Range Status   Specimen Description BLOOD LEFT ARM  Final   Special Requests   Final    BOTTLES DRAWN AEROBIC ONLY Blood Culture results may not be optimal due to an inadequate volume of blood received in culture  bottles   Culture   Final    NO GROWTH 4 DAYS Performed at [facility], [address] [place], [place] [address]    Report Status PENDING  Incomplete     Studies: Dg Chest Port 1 View  Result Date: 01/04/2019 CLINICAL DATA:  PICC line placementPost central line placement EXAM: PORTABLE CHEST 1 VIEW COMPARISON:  01/03/2019 FINDINGS: RIGHT IJ catheter with tip in the distal SVC. Normal cardiac silhouette. Enlarged cardiac silhouette. No pneumothorax. Bilateral moderate pleural effusions. Central venous congestion IMPRESSION: 1. RIGHT central venous line in place without complication. 2. Moderate bilateral effusions and pulmonary venous congestion. Electronically Signed   By: Suzy Bouchard M.D.   On: 01/04/2019 15:00   Scheduled Meds: . amiodarone  200 mg Oral Daily  . apixaban  5 mg Oral BID  . atorvastatin  20 mg Oral q1800  . cholecalciferol  1,000 Units Oral Daily  . colestipol  1 g Oral Daily  . furosemide  80 mg Intravenous Q12H  . insulin aspart  0-5 Units Subcutaneous QHS  . insulin aspart  0-9 Units Subcutaneous TID WC  . insulin aspart  4 Units Subcutaneous TID WC  . latanoprost  1 drop Both Eyes QHS  . magnesium oxide  400 mg Oral Daily  . metoprolol succinate  50 mg Oral Daily  . multivitamin with minerals  1 tablet Oral Daily  . pantoprazole  40 mg Oral Daily  . potassium chloride SA  20 mEq Oral Daily  . sodium chloride flush   3 mL Intravenous Q12H  . tiZANidine  4 mg Oral QHS   Continuous Infusions: . sodium chloride    . sodium chloride 250 mL (01/04/19 1732)  . cefTRIAXone (ROCEPHIN)  IV 2 g (01/04/19 1734)    Active Problems:   CVA (cerebral vascular accident) ([place])   S/P CABG x 3   Type 2 diabetes with nephropathy (HCC)   Acute on chronic systolic (congestive) heart failure (HCC)   Persistent atrial fibrillation   Acute exacerbation of CHF (congestive heart failure) (HCC)   Pressure injury of skin  Time spent:   Irwin Brakeman, MD [facility] 01/05/2019, 12:38 PM    LOS: 3 days  How to contact the [facility] Attending or Consulting provider [place] or covering provider during after hours [place], for this patient?  1. Check the care team in [facility] and look for a) attending/consulting [facility] provider listed and b) the [facility] team listed 2. Log into [email or web address] and use [place]'s universal password to access. If you do not have the password, please contact the hospital operator. 3. Locate the [facility] provider you are looking for under [facility] and page to a number that you can be directly reached. 4. If you still have difficulty reaching the provider, please page the [facility] (Director on Call) for the Hospitalists listed on amion for assistance.

## 2019-01-06 ENCOUNTER — Inpatient Hospital Stay (HOSPITAL_COMMUNITY): Payer: Medicare HMO

## 2019-01-06 DIAGNOSIS — I25708 Atherosclerosis of coronary artery bypass graft(s), unspecified, with other forms of angina pectoris: Secondary | ICD-10-CM

## 2019-01-06 DIAGNOSIS — I5043 Acute on chronic combined systolic (congestive) and diastolic (congestive) heart failure: Secondary | ICD-10-CM

## 2019-01-06 DIAGNOSIS — I4819 Other persistent atrial fibrillation: Secondary | ICD-10-CM

## 2019-01-06 DIAGNOSIS — R7881 Bacteremia: Secondary | ICD-10-CM

## 2019-01-06 DIAGNOSIS — L03115 Cellulitis of right lower limb: Secondary | ICD-10-CM

## 2019-01-06 DIAGNOSIS — J810 Acute pulmonary edema: Secondary | ICD-10-CM

## 2019-01-06 DIAGNOSIS — I5020 Unspecified systolic (congestive) heart failure: Secondary | ICD-10-CM

## 2019-01-06 DIAGNOSIS — J90 Pleural effusion, not elsewhere classified: Secondary | ICD-10-CM

## 2019-01-06 DIAGNOSIS — N183 Chronic kidney disease, stage 3 (moderate): Secondary | ICD-10-CM

## 2019-01-06 DIAGNOSIS — N179 Acute kidney failure, unspecified: Secondary | ICD-10-CM

## 2019-01-06 DIAGNOSIS — J811 Chronic pulmonary edema: Secondary | ICD-10-CM

## 2019-01-06 LAB — CBC
HCT: 26.6 % — ABNORMAL LOW (ref 36.0–46.0)
Hemoglobin: 7.6 g/dL — ABNORMAL LOW (ref 12.0–15.0)
MCH: 22.6 pg — ABNORMAL LOW (ref 26.0–34.0)
MCHC: 28.6 g/dL — ABNORMAL LOW (ref 30.0–36.0)
MCV: 79.2 fL — ABNORMAL LOW (ref 80.0–100.0)
Platelets: 123 10*3/uL — ABNORMAL LOW (ref 150–400)
RBC: 3.36 MIL/uL — ABNORMAL LOW (ref 3.87–5.11)
RDW: 20.5 % — ABNORMAL HIGH (ref 11.5–15.5)
WBC: 4 10*3/uL (ref 4.0–10.5)
nRBC: 0 % (ref 0.0–0.2)

## 2019-01-06 LAB — MAGNESIUM: Magnesium: 2.1 mg/dL (ref 1.7–2.4)

## 2019-01-06 LAB — SARS CORONAVIRUS 2 BY RT PCR (HOSPITAL ORDER, PERFORMED IN ~~LOC~~ HOSPITAL LAB): SARS Coronavirus 2: NEGATIVE

## 2019-01-06 LAB — BASIC METABOLIC PANEL
Anion gap: 9 (ref 5–15)
BUN: 43 mg/dL — ABNORMAL HIGH (ref 8–23)
CO2: 27 mmol/L (ref 22–32)
Calcium: 8.4 mg/dL — ABNORMAL LOW (ref 8.9–10.3)
Chloride: 103 mmol/L (ref 98–111)
Creatinine, Ser: 1.67 mg/dL — ABNORMAL HIGH (ref 0.44–1.00)
GFR calc Af Amer: 37 mL/min — ABNORMAL LOW (ref >60)
GFR calc non Af Amer: 32 mL/min — ABNORMAL LOW (ref >60)
Glucose, Bld: 174 mg/dL — ABNORMAL HIGH (ref 70–99)
Potassium: 4.2 mmol/L (ref 3.5–5.1)
Sodium: 139 mmol/L (ref 135–145)

## 2019-01-06 LAB — GLUCOSE, CAPILLARY
Glucose-Capillary: 155 mg/dL — ABNORMAL HIGH (ref 70–99)
Glucose-Capillary: 140 mg/dL — ABNORMAL HIGH (ref 70–99)
Glucose-Capillary: 165 mg/dL — ABNORMAL HIGH (ref 70–99)
Glucose-Capillary: 180 mg/dL — ABNORMAL HIGH (ref 70–99)
Glucose-Capillary: 182 mg/dL — ABNORMAL HIGH (ref 70–99)

## 2019-01-06 LAB — CULTURE, BLOOD (ROUTINE X 2): Culture: NO GROWTH

## 2019-01-06 NOTE — Progress Notes (Signed)
PROGRESS NOTE  Erica [name]  FGH:[id number]  DOB: [birth year]  DOA: 01/01/2019 PCP: Curlene Labrum, MD  Brief Admission Hx: 68 y/o female with CAD, chronic combined systolic and diastolic CHF, HTN, HLD, IDDM, Stage 3 CKD, and prior CVA presented with cellulitis of right leg and acute CHF exacerbation.   MDM/Assessment & Plan:   1. Acute on chronic combined systolic / diastolic CHF - continue IV lasix for diuresis, , monitor renal function, monitor weights. Cardiology consult appreciated.   She has diuresed 3.6L since admission.  2. Bilateral pleural effusions - xrays show improvement, continue diuresis.   3. Persistent Atrial Fibrillation - Pt was in RVR on arrival but now rate is better controlled, follow telemetry, continue apixaban.  Cardiology treating medically for rate control.   4. Cardiomyopathy - EF 25% followed closely by [facility] team.  5. Anemia of chronic disease - following CBC.   6. History of prior CVA - continue apixaban/atorvastatin.  7. Poor IV access - Placed central line, avoiding PICC due to CKD. 8. Stage 3 CKD - creatinine improved slightly with diuresis.   9. Type 2 DM - with hypoglycemia - DC 70/30 and prandial insulin, continue SSI.  10. RLE streptococcus cellulitis - continue ceftriaxone 2 gm daily as it is improving. 11. Streptoccoccus bacteremia - secondary to cellulitis, continue ceftriaxone to 2 gm daily as seems nearly resolved completely.   12. Sacrum pressure injury POA - skin care protocol.  13. Sepsis POA - resolved  DVT prophylaxis: apixaban Code Status: Full  Family Communication: pt says she preferred to speak with and update daughter Disposition Plan: inpatient  Consultants:  cardiology  Procedures:    Antimicrobials:  Ceftriaxone 5/27 >   Subjective: She says swelling in leg is better. She is diuresing well.     Objective: Vitals:   01/05/19 2010 01/05/19 2113 01/06/19 0627 01/06/19 1311  BP:  116/75 (!) 113/58 (!) 101/52   Pulse:  75 81 88  Resp:  20 16 18   Temp:  98.4 F (36.9 C) 98.3 F (36.8 C) 97.8 F (36.6 C)  TempSrc:  Oral Oral Oral  SpO2: 100% 100% 100% 100%  Weight:      Height:        Intake/Output Summary (Last 24 hours) at 01/06/2019 1702 Last data filed at 01/06/2019 1225 Gross per 24 hour  Intake 249 ml  Output 1400 ml  Net -1151 ml   Filed Weights   01/02/19 0505 01/04/19 0500 01/05/19 0500  Weight: 115.1 kg 117.4 kg 118.8 kg   REVIEW OF SYSTEMS  As per history otherwise all reviewed and reported negative  Exam:  General exam: awake, alert, NAD, cooperative.  Respiratory system: Clear. No increased work of breathing. Cardiovascular system: S1 & S2 heard. No JVD, murmurs, gallops, clicks or pedal edema. Gastrointestinal system: Abdomen is nondistended, soft and nontender. Normal bowel sounds heard. Central nervous system: Alert and oriented. No focal neurological deficits. Extremities: cellulitis, nearly completely resolved redness, heat RLE with 1+ pitting edema BLEs.  Data Reviewed: Basic Metabolic Panel: Recent Labs  Lab 01/02/19 0456 01/03/19 0856 01/04/19 0616 01/05/19 0626 01/06/19 0525  NA 139 138 140 138 139  K 4.7 4.3 4.5 3.8 4.2  CL 106 103 103 102 103  CO2 23 25 24 25 27   GLUCOSE 69* 184* 127* 133* 174*  BUN 60* 58* 57* 49* 43*  CREATININE 2.69* 2.42* 2.07* 1.70* 1.67*  CALCIUM 8.4* 8.4* 8.4* 8.3* 8.4*  MG  --   --   --   --  2.1   Liver Function Tests: Recent Labs  Lab 01/01/19 0517  AST 25  ALT 17  ALKPHOS 92  BILITOT 1.9*  PROT 6.9  ALBUMIN 3.7   No results for input(s): LIPASE, AMYLASE in the last 168 hours. No results for input(s): AMMONIA in the last 168 hours. CBC: Recent Labs  Lab 01/01/19 0517 01/02/19 0456 01/04/19 0616 01/05/19 0626 01/06/19 0525  WBC 14.8* 5.9 4.1 3.5* 4.0  NEUTROABS 13.4*  --   --   --   --   HGB 9.3* 7.2* 7.7* 7.7* 7.6*  HCT 32.0* 25.8* 27.6* 26.5* 26.6*  MCV 78.8* 79.9* 80.0 79.3* 79.2*  PLT 139* 94*  106* 115* 123*   Cardiac Enzymes: Recent Labs  Lab 01/01/19 0517  TROPONINI 0.03*   CBG (last 3)  Recent Labs    01/06/19 0729 01/06/19 1131 01/06/19 1624  GLUCAP 140* 165* 180*   Recent Results (from the past 240 hour(s))  SARS Coronavirus 2 (CEPHEID- Performed in [place] hospital lab), Hosp Order     Status: None   Collection Time: 01/01/19  4:54 AM  Result Value Ref Range Status   SARS Coronavirus 2 NEGATIVE NEGATIVE Final    Comment: (NOTE) If result is NEGATIVE SARS-CoV-2 target nucleic acids are NOT DETECTED. The SARS-CoV-2 RNA is generally detectable in upper and lower  respiratory specimens during the acute phase of infection. The lowest  concentration of SARS-CoV-2 viral copies this assay can detect is 250  copies / mL. A negative result does not preclude SARS-CoV-2 infection  and should not be used as the sole basis for treatment or other  patient management decisions.  A negative result may occur with  improper specimen collection / handling, submission of specimen other  than nasopharyngeal swab, presence of viral mutation(s) within the  areas targeted by this assay, and inadequate number of viral copies  (<250 copies / mL). A negative result must be combined with clinical  observations, patient history, and epidemiological information. If result is POSITIVE SARS-CoV-2 target nucleic acids are DETECTED. The SARS-CoV-2 RNA is generally detectable in upper and lower  respiratory specimens dur ing the acute phase of infection.  Positive  results are indicative of active infection with SARS-CoV-2.  Clinical  correlation with patient history and other diagnostic information is  necessary to determine patient infection status.  Positive results do  not rule out bacterial infection or co-infection with other viruses. If result is PRESUMPTIVE POSTIVE SARS-CoV-2 nucleic acids MAY BE PRESENT.   A presumptive positive result was obtained on the submitted specimen   and confirmed on repeat testing.  While 2019 novel coronavirus  (SARS-CoV-2) nucleic acids may be present in the submitted sample  additional confirmatory testing may be necessary for epidemiological  and / or clinical management purposes  to differentiate between  SARS-CoV-2 and other Sarbecovirus currently known to infect humans.  If clinically indicated additional testing with an alternate test  methodology [phone]) is advised. The SARS-CoV-2 RNA is generally  detectable in upper and lower respiratory sp ecimens during the acute  phase of infection. The expected result is Negative. Fact Sheet for Patients:  StrictlyIdeas.no Fact Sheet for Healthcare Providers: BankingDealers.co.za This test is not yet approved or cleared by the [place] FDA and has been authorized for detection and/or diagnosis of SARS-CoV-2 by FDA under an Emergency Use Authorization (EUA).  This EUA will remain in effect (meaning this test can be used) for the duration of the COVID-19 declaration under Section 564(b)(1)  of the Act, 21 U.S.C. section 360bbb-3(b)(1), unless the authorization is terminated or revoked sooner. Performed at [facility], [address], [place], [place] [address]   Blood Culture (routine x 2)     Status: Abnormal   Collection Time: 01/01/19  5:17 AM  Result Value Ref Range Status   Specimen Description   Final    BLOOD RIGHT ARM Performed at [facility], [address], [place], [place] [address]    Special Requests   Final    BOTTLES DRAWN AEROBIC AND ANAEROBIC Blood Culture adequate volume Performed at [facility], [address], [place],  [address]    Culture  Setup Time   Final    GRAM POSITIVE COCCI Gram Stain Report Called to,Read Back By and Verified With: TAYLOR,M ON 01/01/19 AT 1840 BY LOY,C AEROBIC BOTTLE PERFORMED AT [facility]    Culture STREPTOCOCCUS GROUP G (A)  Final   Report Status 01/03/2019 FINAL  Final    Organism ID, Bacteria STREPTOCOCCUS GROUP G  Final      Susceptibility   Streptococcus group g - MIC*    CLINDAMYCIN RESISTANT Resistant     AMPICILLIN <=0.25 SENSITIVE Sensitive     ERYTHROMYCIN >=8 RESISTANT Resistant     VANCOMYCIN 0.5 SENSITIVE Sensitive     CEFTRIAXONE <=0.12 SENSITIVE Sensitive     LEVOFLOXACIN 0.5 SENSITIVE Sensitive     * STREPTOCOCCUS GROUP G  Blood Culture ID Panel (Reflexed)     Status: Abnormal   Collection Time: 01/01/19  5:17 AM  Result Value Ref Range Status   Enterococcus species NOT DETECTED NOT DETECTED Final   Listeria monocytogenes NOT DETECTED NOT DETECTED Final   Staphylococcus species NOT DETECTED NOT DETECTED Final   Staphylococcus aureus (BCID) NOT DETECTED NOT DETECTED Final   Streptococcus species DETECTED (A) NOT DETECTED Final    Comment: Not Enterococcus species, Streptococcus agalactiae, Streptococcus pyogenes, or Streptococcus pneumoniae. CRITICAL RESULT CALLED TO, READ BACK BY AND VERIFIED WITH: RN T BINES @0230  01/02/19 BY S GEZAHEGN    Streptococcus agalactiae NOT DETECTED NOT DETECTED Final   Streptococcus pneumoniae NOT DETECTED NOT DETECTED Final   Streptococcus pyogenes NOT DETECTED NOT DETECTED Final   Acinetobacter baumannii NOT DETECTED NOT DETECTED Final   Enterobacteriaceae species NOT DETECTED NOT DETECTED Final   Enterobacter cloacae complex NOT DETECTED NOT DETECTED Final   Escherichia coli NOT DETECTED NOT DETECTED Final   Klebsiella oxytoca NOT DETECTED NOT DETECTED Final   Klebsiella pneumoniae NOT DETECTED NOT DETECTED Final   Proteus species NOT DETECTED NOT DETECTED Final   Serratia marcescens NOT DETECTED NOT DETECTED Final   Haemophilus influenzae NOT DETECTED NOT DETECTED Final   Neisseria meningitidis NOT DETECTED NOT DETECTED Final   Pseudomonas aeruginosa NOT DETECTED NOT DETECTED Final   Candida albicans NOT DETECTED NOT DETECTED Final   Candida glabrata NOT DETECTED NOT DETECTED Final   Candida krusei  NOT DETECTED NOT DETECTED Final   Candida parapsilosis NOT DETECTED NOT DETECTED Final   Candida tropicalis NOT DETECTED NOT DETECTED Final    Comment: Performed at [facility], [place] [address] [place],  [address]  Blood Culture (routine x 2)     Status: None   Collection Time: 01/01/19  5:27 AM  Result Value Ref Range Status   Specimen Description BLOOD LEFT ARM  Final   Special Requests   Final    BOTTLES DRAWN AEROBIC ONLY Blood Culture results may not be optimal due to an inadequate volume of blood  received in culture bottles   Culture   Final    NO GROWTH 5 DAYS Performed at [facility], [address] [place], [place] [address]    Report Status 01/06/2019 FINAL  Final     Studies: Dg Chest Port 1 View  Result Date: 01/06/2019 CLINICAL DATA:  68 year old female with acute on chronic combined diastolic and systolic dysfunction, CHF exacerbation. EXAM: PORTABLE CHEST 1 VIEW COMPARISON:  01/04/2019 and earlier. FINDINGS: Portable AP upright view at 0515 hours. Stable right IJ central line. Stable cardiomegaly and mediastinal contours. Continued veiling opacity at the right lung base. No pneumothorax. Continued pulmonary vascular congestion and/or mild interstitial edema. No confluent opacity in the left lung. Paucity of bowel gas in the upper abdomen. IMPRESSION: Stable probable small right pleural effusion with vascular congestion and/or mild interstitial edema Electronically Signed   By: Genevie Ann M.D.   On: 01/06/2019 07:06   Scheduled Meds: . apixaban  5 mg Oral BID  . atorvastatin  20 mg Oral q1800  . cholecalciferol  1,000 Units Oral Daily  . colestipol  1 g Oral Daily  . furosemide  80 mg Intravenous Q12H  . insulin aspart  0-5 Units Subcutaneous QHS  . insulin aspart  0-9 Units Subcutaneous TID WC  . insulin aspart  4 Units Subcutaneous TID WC  . latanoprost  1 drop Both Eyes QHS  . magnesium oxide  400 mg Oral Daily  . metoprolol succinate  50 mg Oral Daily  .  multivitamin with minerals  1 tablet Oral Daily  . pantoprazole  40 mg Oral Daily  . potassium chloride SA  20 mEq Oral Daily  . sodium chloride flush  3 mL Intravenous Q12H  . tiZANidine  4 mg Oral QHS   Continuous Infusions: . sodium chloride    . sodium chloride 250 mL (01/04/19 1732)  . cefTRIAXone (ROCEPHIN)  IV 2 g (01/05/19 1736)    Active Problems:   CVA (cerebral vascular accident) ([place])   S/P CABG x 3   Type 2 diabetes with nephropathy (HCC)   Acute on chronic systolic (congestive) heart failure (HCC)   Persistent atrial fibrillation   Acute exacerbation of CHF (congestive heart failure) (HCC)   Pressure injury of skin  Time spent:   Irwin Brakeman, MD [facility] 01/06/2019, 5:02 PM    LOS: 4 days  How to contact the [facility] Attending or Consulting provider [place] or covering provider during after hours [place], for this patient?  1. Check the care team in [facility] and look for a) attending/consulting TR[facility] provider listed and b) the [facility] team listed 2. Log into [email or web address] and use [place]'s universal password to access. If you do not have the password, please contact the hospital operator. 3. Locate the [facility] provider you are looking for under [facility] and page to a number that you can be directly reached. 4. If you still have difficulty reaching the provider, please page the [facility] (Director on Call) for the Hospitalists listed on amion for assistance.

## 2019-01-06 NOTE — Care Management Important Message (Signed)
Important Message  Patient Details  Name: Erica [name] MRN: [id number] Date of Birth: [birth year]   Medicare Important Message Given:  Yes    [name] 01/06/2019, 12:02 PM

## 2019-01-06 NOTE — TOC Progression Note (Signed)
Transition of Care [facility]) - Progression Note    Patient Details  Name: ARMELLA STOGNER MRN: [id number] Date of Birth: [birth year]  Transition of Care [facility]) CM/SW Contact  Ihor Gully, LCSW Phone Number: 01/06/2019, 1:49 PM  Clinical Narrative:    Patient recommended for SNF. Patient is agreeable to SNF and would like to go to SNF in [place] as she lives in [place].  Provided choices. Referrasl sent to requested choices.    Expected Discharge Plan: [place] Barriers to Discharge: No Barriers Identified  Expected Discharge Plan and Services Expected Discharge Plan: [place]   Discharge Planning Services: CM Consult Post Acute Care Choice: Home Health, Resumption of Svcs/PTA Provider   Expected Discharge Date: 01/03/19                                     Social Determinants of Health (SDOH) Interventions    Readmission Risk Interventions Readmission Risk Prevention Plan 01/06/2019 01/03/2019 01/02/2019  Transportation Screening Complete - Complete  PCP or Specialist Appt within 5-7 Days - - -  PCP or Specialist Appt within 3-5 Days - Complete -  Home Care Screening - - Complete  Medication Review (RN CM) - - Complete  HRI or [place] - Complete -  Social Work Consult for Recovery Care Planning/Counseling - Complete -  Palliative Care Screening - Not Applicable -  Medication Review (RN Care Manager) - Complete -  Some recent data might be hidden

## 2019-01-06 NOTE — Progress Notes (Signed)
Progress Note  Patient Name: Erica [name] Date of Encounter: 01/06/2019  Primary Cardiologist: Kate Sable, MD   Subjective   She denies chest pain.  Her breathing and leg swelling have improved.  Inpatient Medications    Scheduled Meds:  amiodarone  200 mg Oral Daily   apixaban  5 mg Oral BID   atorvastatin  20 mg Oral q1800   cholecalciferol  1,000 Units Oral Daily   colestipol  1 g Oral Daily   furosemide  80 mg Intravenous Q12H   insulin aspart  0-5 Units Subcutaneous QHS   insulin aspart  0-9 Units Subcutaneous TID WC   insulin aspart  4 Units Subcutaneous TID WC   latanoprost  1 drop Both Eyes QHS   magnesium oxide  400 mg Oral Daily   metoprolol succinate  50 mg Oral Daily   multivitamin with minerals  1 tablet Oral Daily   pantoprazole  40 mg Oral Daily   potassium chloride SA  20 mEq Oral Daily   sodium chloride flush  3 mL Intravenous Q12H   tiZANidine  4 mg Oral QHS   Continuous Infusions:  sodium chloride     sodium chloride 250 mL (01/04/19 1732)   cefTRIAXone (ROCEPHIN)  IV 2 g (01/05/19 1736)   PRN Meds: sodium chloride, sodium chloride, acetaminophen **OR** acetaminophen, albuterol, nitroGLYCERIN, ondansetron **OR** ondansetron (ZOFRAN) IV, polyethylene glycol, sodium chloride flush, traZODone   Vital Signs    Vitals:   01/05/19 1557 01/05/19 2010 01/05/19 2113 01/06/19 0627  BP: (!) 95/56  116/75 (!) 113/58  Pulse: 81  75 81  Resp: 18  20 16   Temp: 97.7 F (36.5 C)  98.4 F (36.9 C) 98.3 F (36.8 C)  TempSrc: Oral  Oral Oral  SpO2: 100% 100% 100% 100%  Weight:      Height:        Intake/Output Summary (Last 24 hours) at 01/06/2019 0905 Last data filed at 01/06/2019 0600 Gross per 24 hour  Intake 6 ml  Output 1400 ml  Net -1394 ml   Filed Weights   01/02/19 0505 01/04/19 0500 01/05/19 0500  Weight: 115.1 kg 117.4 kg 118.8 kg    Telemetry    Atrial fibrillation, 80 bpm range- Personally  Reviewed   Physical Exam   GEN: No acute distress.   Neck: No JVD Cardiac:  Irregular rhythm, no murmurs, rubs, or gallops.  Respiratory: Clear to auscultation bilaterally. GI: Soft, nontender, non-distended  MS:  1+ bilateral pitting lower extremity edema; No deformity. Neuro:  Nonfocal  Psych: Normal affect   Labs    Chemistry Recent Labs  Lab 01/01/19 0517  01/04/19 0616 01/05/19 0626 01/06/19 0525  NA 139   < > 140 138 139  K 5.1   < > 4.5 3.8 4.2  CL 104   < > 103 102 103  CO2 20*   < > 24 25 27   GLUCOSE 64*   < > 127* 133* 174*  BUN 62*   < > 57* 49* 43*  CREATININE 2.87*   < > 2.07* 1.70* 1.67*  CALCIUM 9.1   < > 8.4* 8.3* 8.4*  PROT 6.9  --   --   --   --   ALBUMIN 3.7  --   --   --   --   AST 25  --   --   --   --   ALT 17  --   --   --   --  ALKPHOS 92  --   --   --   --   BILITOT 1.9*  --   --   --   --   GFRNONAA 16*   < > 24* 31* 32*  GFRAA 19*   < > 28* 36* 37*  ANIONGAP 15   < > 13 11 9    < > = values in this interval not displayed.     Hematology Recent Labs  Lab 01/04/19 0616 01/05/19 0626 01/06/19 0525  WBC 4.1 3.5* 4.0  RBC 3.45* 3.34* 3.36*  HGB 7.7* 7.7* 7.6*  HCT 27.6* 26.5* 26.6*  MCV 80.0 79.3* 79.2*  MCH 22.3* 23.1* 22.6*  MCHC 27.9* 29.1* 28.6*  RDW 20.7* 20.6* 20.5*  PLT 106* 115* 123*    Cardiac Enzymes Recent Labs  Lab 01/01/19 0517  TROPONINI 0.03*   No results for input(s): TROPIPOC in the last 168 hours.   BNP Recent Labs  Lab 01/01/19 0517  BNP 1,987.0*     DDimer No results for input(s): DDIMER in the last 168 hours.   Radiology    Dg Chest Port 1 View  Result Date: 01/06/2019 CLINICAL DATA:  68 year old female with acute on chronic combined diastolic and systolic dysfunction, CHF exacerbation. EXAM: PORTABLE CHEST 1 VIEW COMPARISON:  01/04/2019 and earlier. FINDINGS: Portable AP upright view at 0515 hours. Stable right IJ central line. Stable cardiomegaly and mediastinal contours. Continued veiling  opacity at the right lung base. No pneumothorax. Continued pulmonary vascular congestion and/or mild interstitial edema. No confluent opacity in the left lung. Paucity of bowel gas in the upper abdomen. IMPRESSION: Stable probable small right pleural effusion with vascular congestion and/or mild interstitial edema Electronically Signed   By: Genevie Ann M.D.   On: 01/06/2019 07:06   Dg Chest Port 1 View  Result Date: 01/04/2019 CLINICAL DATA:  PICC line placementPost central line placement EXAM: PORTABLE CHEST 1 VIEW COMPARISON:  01/03/2019 FINDINGS: RIGHT IJ catheter with tip in the distal SVC. Normal cardiac silhouette. Enlarged cardiac silhouette. No pneumothorax. Bilateral moderate pleural effusions. Central venous congestion IMPRESSION: 1. RIGHT central venous line in place without complication. 2. Moderate bilateral effusions and pulmonary venous congestion. Electronically Signed   By: Suzy Bouchard M.D.   On: 01/04/2019 15:00    Cardiac Studies   Echocardiogram: 09/2018 Technically difficult study due to chest wall/lung interference  Severely decreased left ventricular systolic function, ejection fraction  25%  Segmental wall abnormalities (please see below)  Dilated right ventricle - mild  Mildly decreased right ventricular systolic function  Elevated pulmonary artery systolic pressure - mild  Patient Profile     68 y.o. female with past medical history of CAD (s/p CABG in 10/2012), carotid artery stenosis, chronic combined systolic and diastolic CHF (EF previously 40%, 50% by repeat imaging in 03/2018, at 25% by echo in 09/2018),persistent atrial fibrillation (diagnosed in 09/2018, on Eliquis),HTN, HLD, IDDM,Stage 3 CKD,and prior CVA who is being seen today for the evaluation of CHF at the request of Dr. Wynetta Emery.   Assessment & Plan    1. Acute on Chronic Combined Systolic and Diastolic CHF: Symptomatic improvement.  LVEF 25% by echocardiogram in February 2020 with moderate  left atrial enlargement.  She is diuresing well on IV Lasix 80 mg twice daily.  Creatinine down to 1.67.  Approximately 1.4 L output in the past 24 hours.  I will continue current regimen.  Continue Toprol-XL 50 mg daily. I am unable to add ACE inhibitors, angiotensin receptor blockers,  or angiotensin receptor-neprilysin inhibitors due to advanced chronic kidney disease. Weight yesterday 261 pounds but I doubt accuracy.  2. Persistent Atrial Fibrillation: Underwent DCCV last admission but only maintained NSR for a few hours. She is currently on Toprol-XL 50 mg daily and amiodarone 200 mg daily.  Due to her multiple comorbidities, I suspect she will have a high recurrence rate.  I will elect to proceed with a rate control strategy and stop amiodarone. Continue Eliquis for anticoagulation.   3. CAD S/p CABG in 10/2012. She denies any recent chest pain. Has been experiencing dyspnea on exertion as outlined above.  Initial troponin flat at 0.03. Continue BB and statin therapy. No longer on ASA given the need for anticoagulation.   4. Acute on Chronic Stage 3 CKD Previous baseline creatinine of 1.6 - 1.7.  Currently 1.67.  5. Bacteremia: Blood cultures positive for streptococcus. Has been started on Ceftriaxone per the admitting team.    For questions or updates, please contact [place] Please consult [email or web address] for contact info under Cardiology/STEMI.      Signed, Kate Sable, MD  01/06/2019, 9:05 AM

## 2019-01-06 NOTE — Progress Notes (Signed)
Physical Therapy Treatment Patient Details Name: Erica [name] MRN: [id number] DOB: 1950-11-29 Today's Date: 01/06/2019    History of Present Illness Erica [name]  is a 68 y.o. female with past medical history of CAD (s/p CABG in 10/2012), carotid artery stenosis, chronic combined systolic and diastolic CHF (EF previously 40%, at 50% by repeat imaging in 03/2018), HTN, HLD, IDDM, Stage 3 CKD, and prior CVA presents with worsening orthopnea, increasing bilateral lower extremity edema, worsening shortness of breath, worsening dyspnea on exertion----O2 requirement has increased to 4 L from 2 L,...    PT Comments    Patient continues to having difficulty sitting up at bedside mostly due limited use of RUE, normally sleeps in a lounge chair at home, had to have bed raised to complete sit to stands and required assistance when sit to standing from chair.  Patient demonstrates increased endurance/distance for taking steps at bedside, but limited mostly due to RLE pain and weakness.  Patient tolerated sitting up in chair after therapy - RN notified.  Patient will benefit from continued physical therapy in hospital and recommended venue below to increase strength, balance, endurance for safe ADLs and gait.   Follow Up Recommendations  SNF     Equipment Recommendations  None recommended by PT    Recommendations for Other Services       Precautions / Restrictions Restrictions Weight Bearing Restrictions: No    Mobility  Bed Mobility Overal bed mobility: Needs Assistance Bed Mobility: Supine to Sit     Supine to sit: Min guard;Min assist     General bed mobility comments: head of bed raised, had to use side rail, slow labored movement  Transfers Overall transfer level: Needs assistance Equipment used: Hemi-walker Transfers: Sit to/from [employer] Transfers Sit to Stand: Min guard;Min assist Stand pivot transfers: Min guard;Min assist       General transfer comment: required  bed raised for sit to stand, required assistance to sit to stand from chair due to weakness  Ambulation/Gait Ambulation/Gait assistance: Min guard;Min assist Gait Distance (Feet): 15 Feet Assistive device: Hemi-walker Gait Pattern/deviations: Decreased step length - right;Decreased stance time - right;Decreased stride length;Antalgic Gait velocity: slow   General Gait Details: tolerated ambulating from chair to end of bed x 3 laps before having to sit due to increased soreness RLE and fatigue   Stairs             Wheelchair Mobility    Modified Rankin (Stroke Patients Only)       Balance Overall balance assessment: Needs assistance Sitting-balance support: Feet supported;No upper extremity supported Sitting balance-Leahy Scale: Good     Standing balance support: Single extremity supported;During functional activity Standing balance-Leahy Scale: Fair Standing balance comment: using Hemi-walker                            Cognition Arousal/Alertness: Awake/alert Behavior During Therapy: WFL for tasks assessed/performed Overall Cognitive Status: Within Functional Limits for tasks assessed                                        Exercises General Exercises - Lower Extremity Long Arc Quad: Seated;Strengthening;AROM;Both;10 reps Hip Flexion/Marching: Seated;AROM;Strengthening;Both;10 reps Toe Raises: Seated;Strengthening;AROM;Left;10 reps Heel Raises: Seated;AROM;Strengthening;Left;10 reps    General Comments        Pertinent Vitals/Pain Pain Assessment: Faces Faces Pain Scale: Hurts little more  Pain Location: RLE when standing, none at rest Pain Descriptors / Indicators: Sore Pain Intervention(s): Limited activity within patient's tolerance;Monitored during session    Home Living                      Prior Function            PT Goals (current goals can now be found in the care plan section) Acute Rehab PT  Goals Patient Stated Goal: return home after rehab PT Goal Formulation: With patient Time For Goal Achievement: 01/18/19 Potential to Achieve Goals: Good Progress towards PT goals: Progressing toward goals    Frequency    Min 3X/week      PT Plan Current plan remains appropriate    Co-evaluation              AM-PAC PT "6 Clicks" Mobility   Outcome Measure    Help needed moving from lying on your back to sitting on the side of a flat bed without using bedrails?: A Lot Help needed moving to and from a bed to a chair (including a wheelchair)?: A Little Help needed standing up from a chair using your arms (e.g., wheelchair or bedside chair)?: A Lot Help needed to walk in hospital room?: A Lot Help needed climbing 3-5 steps with a railing? : A Lot 6 Click Score: 11    End of Session Equipment Utilized During Treatment: Oxygen Activity Tolerance: Patient tolerated treatment well;Patient limited by fatigue;Patient limited by pain Patient left: in chair;with call bell/phone within reach Nurse Communication: Mobility status PT Visit Diagnosis: Unsteadiness on feet (R26.81);Other abnormalities of gait and mobility (R26.89);Muscle weakness (generalized) (M62.81)     Time: 1751-0258 PT Time Calculation (min) (ACUTE ONLY): 28 min  Charges:  $Therapeutic Exercise: 8-22 mins $Therapeutic Activity: 8-22 mins                     12:40 PM, 01/06/19 Erica [name], MPT Physical Therapist with [facility] [phone] office [phone] mobile phone

## 2019-01-06 NOTE — NC FL2 (Signed)
[place] MEDICAID FL2 LEVEL OF CARE SCREENING TOOL     IDENTIFICATION  Patient Name: Erica [name] Birthdate: 08-02-1951 Sex: female Admission Date (Current Location): 01/01/2019  [facility] and [place] Number:  [employer] and Address:  [place] [address]      Provider Number: [id number]  Attending Physician Name and Address:  Murlean Iba, MD  Relative Name and Phone Number:       Current Level of Care: Hospital Recommended Level of Care: [place] Prior Approval Number:    Date Approved/Denied:   PASRR Number:    Discharge Plan: SNF    Current Diagnoses: Patient Active Problem List   Diagnosis Date Noted  . Pressure injury of skin 01/04/2019  . Acute exacerbation of CHF (congestive heart failure) ([place]) 01/01/2019  . Hypoxia   . Acute renal failure superimposed on stage 3 chronic kidney disease ([place])   . Persistent atrial fibrillation   . Acute on chronic systolic (congestive) heart failure ([place]) 11/29/2018  . Type 2 diabetes with nephropathy ([place]) 08/02/2018  . Obesity, Class III, BMI 40-49.9 (morbid obesity) ([place]) 08/02/2018  . GERD (gastroesophageal reflux disease) 04/14/2015  . Diarrhea 12/10/2013  . Carcinoid tumor   . CVA (cerebral vascular accident) ([place]) 03/07/2013  . 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  . Hyperlipidemia LDL goal <70 04/30/2009  . OVERWEIGHT/OBESITY 04/30/2009  . Essential hypertension 04/30/2009  . CAD, NATIVE VESSEL 04/30/2009  . BRUIT 04/30/2009    Orientation RESPIRATION BLADDER Height & Weight     Self, Time, Situation, Place  O2(3L) Incontinent Weight: 261 lb 14.5 oz (118.8 kg) Height:  5' (152.4 cm)  BEHAVIORAL SYMPTOMS/MOOD NEUROLOGICAL BOWEL NUTRITION STATUS      Continent Diet(Heart healthy)  AMBULATORY STATUS COMMUNICATION OF NEEDS Skin   Limited Assist Verbally PU Stage and Appropriate Care(sacrum )                       Personal Care Assistance  Level of Assistance  Bathing, Feeding, Dressing Bathing Assistance: Limited assistance Feeding assistance: Independent Dressing Assistance: Limited assistance     Functional Limitations Info  Sight, Hearing, Speech Sight Info: Adequate Hearing Info: Adequate Speech Info: Adequate    SPECIAL CARE FACTORS FREQUENCY  PT (By licensed PT)     PT Frequency: 5x/week              Contractures Contractures Info: Not present    Additional Factors Info  Code Status, Allergies Code Status Info: Full Allergies Info: Bee Venom, Tradjenta, Codeine           Current Medications (01/06/2019):  This is the current hospital active medication list Current Facility-Administered Medications  Medication Dose Route Frequency Provider Last Rate Last Dose  . 0.9 %  sodium chloride infusion  250 mL Intravenous PRN Emokpae, Courage, MD      . 0.9 %  sodium chloride infusion   Intravenous PRN Wynetta Emery, Clanford L, MD 10 mL/hr at 01/04/19 1732 250 mL at 01/04/19 1732  . acetaminophen (TYLENOL) tablet 650 mg  650 mg Oral Q6H PRN Roxan Hockey, MD   650 mg at 01/04/19 1738   Or  . acetaminophen (TYLENOL) suppository 650 mg  650 mg Rectal Q6H PRN Emokpae, Courage, MD      . albuterol (PROVENTIL) (2.5 MG/3ML) 0.083% nebulizer solution 2.5 mg  2.5 mg Nebulization Q2H PRN Emokpae, Courage, MD      . apixaban (ELIQUIS) tablet 5  mg  5 mg Oral BID Roxan Hockey, MD   5 mg at 01/06/19 1019  . atorvastatin (LIPITOR) tablet 20 mg  20 mg Oral q1800 Emokpae, Courage, MD   20 mg at 01/05/19 1730  . cefTRIAXone (ROCEPHIN) 2 g in sodium chloride 0.9 % 100 mL IVPB  2 g Intravenous Q24H Johnson, Clanford L, MD 200 mL/hr at 01/05/19 1736 2 g at 01/05/19 1736  . cholecalciferol (VITAMIN D3) tablet 1,000 Units  1,000 Units Oral Daily Denton Brick, Courage, MD   1,000 Units at 01/06/19 1019  . colestipol (COLESTID) tablet 1 g  1 g Oral Daily Emokpae, Courage, MD   1 g at 01/06/19 1027  . furosemide (LASIX) injection 80 mg   80 mg Intravenous Q12H Arnoldo Lenis, MD   80 mg at 01/06/19 0531  . insulin aspart (novoLOG) injection 0-5 Units  0-5 Units Subcutaneous QHS Johnson, Clanford L, MD      . insulin aspart (novoLOG) injection 0-9 Units  0-9 Units Subcutaneous TID WC Johnson, Clanford L, MD   2 Units at 01/06/19 1236  . insulin aspart (novoLOG) injection 4 Units  4 Units Subcutaneous TID WC Johnson, Clanford L, MD   4 Units at 01/06/19 1235  . latanoprost (XALATAN) 0.005 % ophthalmic solution 1 drop  1 drop Both Eyes QHS Emokpae, Courage, MD   1 drop at 01/05/19 2120  . magnesium oxide (MAG-OX) tablet 400 mg  400 mg Oral Daily Emokpae, Courage, MD   400 mg at 01/06/19 1019  . metoprolol succinate (TOPROL-XL) 24 hr tablet 50 mg  50 mg Oral Daily Emokpae, Courage, MD   50 mg at 01/06/19 1019  . multivitamin with minerals tablet 1 tablet  1 tablet Oral Daily Emokpae, Courage, MD   1 tablet at 01/06/19 1019  . nitroGLYCERIN (NITROSTAT) SL tablet 0.4 mg  0.4 mg Sublingual Q5 min PRN Emokpae, Courage, MD      . ondansetron (ZOFRAN) tablet 4 mg  4 mg Oral Q6H PRN Emokpae, Courage, MD       Or  . ondansetron (ZOFRAN) injection 4 mg  4 mg Intravenous Q6H PRN Emokpae, Courage, MD      . pantoprazole (PROTONIX) EC tablet 40 mg  40 mg Oral Daily Emokpae, Courage, MD   40 mg at 01/03/19 0947  . polyethylene glycol (MIRALAX / GLYCOLAX) packet 17 g  17 g Oral Daily PRN Emokpae, Courage, MD      . potassium chloride SA (K-DUR) CR tablet 20 mEq  20 mEq Oral Daily Emokpae, Courage, MD   20 mEq at 01/06/19 1019  . sodium chloride flush (NS) 0.9 % injection 3 mL  3 mL Intravenous Q12H Emokpae, Courage, MD   3 mL at 01/06/19 1029  . sodium chloride flush (NS) 0.9 % injection 3 mL  3 mL Intravenous PRN Roxan Hockey, MD   3 mL at 01/05/19 2117  . tiZANidine (ZANAFLEX) tablet 4 mg  4 mg Oral QHS Emokpae, Courage, MD   4 mg at 01/05/19 2116  . traZODone (DESYREL) tablet 50 mg  50 mg Oral QHS PRN Roxan Hockey, MD   50 mg at  01/02/19 2202     Discharge Medications: Please see discharge summary for a list of discharge medications.  Relevant Imaging Results:  Relevant Lab Results:   Additional Information SSN [id number] Clydene Pugh, LCSW

## 2019-01-07 DIAGNOSIS — R7881 Bacteremia: Secondary | ICD-10-CM

## 2019-01-07 DIAGNOSIS — I5043 Acute on chronic combined systolic (congestive) and diastolic (congestive) heart failure: Secondary | ICD-10-CM

## 2019-01-07 DIAGNOSIS — L03115 Cellulitis of right lower limb: Secondary | ICD-10-CM

## 2019-01-07 DIAGNOSIS — I25708 Atherosclerosis of coronary artery bypass graft(s), unspecified, with other forms of angina pectoris: Secondary | ICD-10-CM

## 2019-01-07 LAB — CBC
HCT: 29 % — ABNORMAL LOW (ref 36.0–46.0)
Hemoglobin: 8.2 g/dL — ABNORMAL LOW (ref 12.0–15.0)
MCH: 22.7 pg — ABNORMAL LOW (ref 26.0–34.0)
MCHC: 28.3 g/dL — ABNORMAL LOW (ref 30.0–36.0)
MCV: 80.3 fL (ref 80.0–100.0)
Platelets: 145 10*3/uL — ABNORMAL LOW (ref 150–400)
RBC: 3.61 MIL/uL — ABNORMAL LOW (ref 3.87–5.11)
RDW: 20.7 % — ABNORMAL HIGH (ref 11.5–15.5)
WBC: 5 10*3/uL (ref 4.0–10.5)
nRBC: 0 % (ref 0.0–0.2)

## 2019-01-07 LAB — GLUCOSE, CAPILLARY
Glucose-Capillary: 191 mg/dL — ABNORMAL HIGH (ref 70–99)
Glucose-Capillary: 169 mg/dL — ABNORMAL HIGH (ref 70–99)
Glucose-Capillary: 253 mg/dL — ABNORMAL HIGH (ref 70–99)
Glucose-Capillary: 172 mg/dL — ABNORMAL HIGH (ref 70–99)

## 2019-01-07 LAB — BASIC METABOLIC PANEL
Anion gap: 13 (ref 5–15)
BUN: 39 mg/dL — ABNORMAL HIGH (ref 8–23)
CO2: 26 mmol/L (ref 22–32)
Calcium: 8.5 mg/dL — ABNORMAL LOW (ref 8.9–10.3)
Chloride: 101 mmol/L (ref 98–111)
Creatinine, Ser: 1.54 mg/dL — ABNORMAL HIGH (ref 0.44–1.00)
GFR calc Af Amer: 40 mL/min — ABNORMAL LOW (ref >60)
GFR calc non Af Amer: 35 mL/min — ABNORMAL LOW (ref >60)
Glucose, Bld: 210 mg/dL — ABNORMAL HIGH (ref 70–99)
Potassium: 4.1 mmol/L (ref 3.5–5.1)
Sodium: 140 mmol/L (ref 135–145)

## 2019-01-07 MED ORDER — CEFDINIR 300 MG PO CAPS: 600.0000 mg | ORAL_CAPSULE | Freq: Every day | ORAL | 0 refills | Status: AC

## 2019-01-07 MED ORDER — INSULIN NPH ISOPHANE & REGULAR (70-30) 100 UNIT/ML ~~LOC~~ SUSP: 12.0000 [IU] | Freq: Two times a day (BID) | SUBCUTANEOUS | 11 refills | Status: AC

## 2019-01-07 MED ORDER — AMIODARONE HCL 200 MG PO TABS: 200.0000 mg | ORAL_TABLET | Freq: Every day | ORAL | Status: DC

## 2019-01-07 MED ORDER — TORSEMIDE 20 MG PO TABS: 40.0000 mg | ORAL_TABLET | Freq: Every day | ORAL | Status: DC

## 2019-01-07 MED ORDER — METOPROLOL SUCCINATE ER 50 MG PO TB24: 50.0000 mg | ORAL_TABLET | Freq: Every day | ORAL | 3 refills | Status: AC

## 2019-01-07 NOTE — Discharge Instructions (Signed)
IMPORTANT INFORMATION: PAY CLOSE ATTENTION   PHYSICIAN DISCHARGE INSTRUCTIONS  Follow with Primary care provider  Burdine, Virgina Evener, MD  and other consultants as instructed your Hospitalist Physician  SEEK MEDICAL CARE OR RETURN TO EMERGENCY ROOM IF SYMPTOMS COME BACK, WORSEN OR NEW PROBLEM DEVELOPS.   Please note: You were cared for by a hospitalist during your hospital stay. Every effort will be made to forward records to your primary care provider.  You can request that your primary care provider send for your hospital records if they have not received them.  Once you are discharged, your primary care physician will handle any further medical issues. Please note that NO REFILLS for any discharge medications will be authorized once you are discharged, as it is imperative that you return to your primary care physician (or establish a relationship with a primary care physician if you do not have one) for your post hospital discharge needs so that they can reassess your need for medications and monitor your lab values.  Please get a complete blood count and chemistry panel checked by your Primary MD at your next visit, and again as instructed by your Primary MD.  Get Medicines reviewed and adjusted: Please take all your medications with you for your next visit with your Primary MD  Laboratory/radiological data: Please request your Primary MD to go over all hospital tests and procedure/radiological results at the follow up, please ask your primary care provider to get all Hospital records sent to his/her office.  In some cases, they will be blood work, cultures and biopsy results pending at the time of your discharge. Please request that your primary care provider follow up on these results.  If you are diabetic, please bring your blood sugar readings with you to your follow up appointment with primary care.    Please call and make your follow up appointments as soon as possible.    Also Note  the following: If you experience worsening of your admission symptoms, develop shortness of breath, life threatening emergency, suicidal or homicidal thoughts you must seek medical attention immediately by calling 911 or calling your MD immediately  if symptoms less severe.  You must read complete instructions/literature along with all the possible adverse reactions/side effects for all the Medicines you take and that have been prescribed to you. Take any new Medicines after you have completely understood and accpet all the possible adverse reactions/side effects.   Do not drive when taking Pain medications or sleeping medications (Benzodiazepines)  Do not take more than prescribed Pain, Sleep and Anxiety Medications. It is not advisable to combine anxiety,sleep and pain medications without talking with your primary care practitioner  Special Instructions: If you have smoked or chewed Tobacco  in the last 2 yrs please stop smoking, stop any regular Alcohol  and or any Recreational drug use.  Wear Seat belts while driving.   Weigh daily first thing in the morning if you have scales and keep a log of this, have ready for your virtual appt.  Call Dr. Kristian Covey office if wt goes up 3 lbs in a day or 5 lbs in a week.     Low salt diet

## 2019-01-07 NOTE — Discharge Summary (Addendum)
Physician Discharge Summary  Erica [name] GQQ:[id number] DOB: 14-May-1951 DOA: 01/01/2019  PCP: Curlene Labrum, MD Cardio: [facility]  Admit date: 01/01/2019 Discharge date: 01/07/2019  Admitted From:  Home  Disposition: SNF  Recommendations for Outpatient Follow-up:  1. Follow up with PCP in 2 weeks 2. Follow up with cardiology in 2 weeks 3. Please obtain BMP/CBC in 1 week to check electrolytes 4. Please weigh daily and report weight gain over 5 lbs to PCP and cardiology team 5. Please check blood sugar 4 times per day and titrate insulin doses as needed  Discharge Condition: STABLE   CODE STATUS: FULL    Brief Hospitalization Summary: Please see all hospital notes, images, labs for full details of the hospitalization. Dr. Denton Brick HPI:  Erica [name]  is a 68 y.o. female with past medical history of CAD (s/p CABG in 10/2012), carotid artery stenosis, chronic combined systolic and diastolic CHF (EF previously 40%, at 50% by repeat imaging in 03/2018), HTN, HLD, IDDM,Stage 3 CKD,and prior CVA presents with worsening orthopnea, increasing bilateral lower extremity edema, worsening shortness of breath, worsening dyspnea on exertion----O2 requirement has increased to 4 L from 2 L,...  Patient states she has been compliant with medications and diet,  No pleuritic symptoms, no frank chest pains,... No fevers or chills no productive cough  In ED... She is found to be in A. fib with RVR with worsening hypoxia BNP is 1987 which is higher than previous baseline, chest x-ray consistent with right-sided pleural effusion and congestive heart failure... Troponin 0 0.03, creatinine is up to 3.87 from a previous baseline of 1.7.Marland KitchenMarland Kitchen Please note the creatinine was also 2.8 about 5 days ago.    Brief Admission Hx: 68 y/o female with CAD, chronic combined systolic and diastolic CHF, HTN, HLD, IDDM, Stage 3 CKD, and prior CVA presented with cellulitis of right leg and acute CHF  exacerbation.   MDM/Assessment & Plan:   1. Acute on chronic combined systolic / diastolic CHF - She was treated IV lasix 80 mg BID for diuresis and responded well to this treatment, monitored renal function which remained stable, monitored weights. Her discharge weight 112.9 Kg.   Cardiology consult appreciated.   She has diuresed 4.8L since admission and feeling much better.  Discharging on torsemide 40 mg daily.  Continue supplemental potassium.  2. Bilateral pleural effusions - xrays show improvement, treated with IV diuresis.   3. Persistent Atrial Fibrillation - Pt was in RVR on arrival but now rate is better controlled, followed telemetry, continue apixaban.  Cardiology treating medically for rate control and did not repeat cardioversion.  She will discharge on toprol XL 50 mg daily.   4. Cardiomyopathy - EF 25% followed closely by [facility] team. 2 week follow up recommended.   5. Anemia of chronic disease - followed CBC, Hg improved to 8.2.    6. History of prior CVA - continue apixaban/atorvastatin.  7. Poor IV access - Placed central line, avoiding PICC due to CKD.  Discontinued central line prior to discharge.  8. Stage 3 CKD - creatinine improved with diuresis.  creatinine 1.54.  9. Type 2 DM - with hypoglycemia - reduced home insulin to 70/30, 12 units BID with meals, check CBG 4 times per day.  10. RLE streptococcus cellulitis - resolved now, treated with ceftriaxone 2 gm daily and discharge on 3 more days of oral omnicef.  11. Streptoccoccus bacteremia - secondary to cellulitis, treated with ceftriaxone to 2 gm daily and discharging on  3 more days of oral omnicef.    12. Sacrum pressure injury POA - skin care protocol.  13. Sepsis POA - resolved  DVT prophylaxis: apixaban Code Status: Full  Family Communication: pt says she preferred to speak with and update daughter/son Disposition Plan: SNF  Consultants:  cardiology  Filed Weights   01/04/19 0500 01/05/19 0500  01/07/19 0541  Weight: 117.4 kg 118.8 kg 112.9 kg     Discharge Diagnoses:  Active Problems:   CVA (cerebral vascular accident) ([place])   S/P CABG x 3   Type 2 diabetes with nephropathy (HCC)   Acute on chronic systolic (congestive) heart failure (HCC)   Persistent atrial fibrillation   Acute exacerbation of CHF (congestive heart failure) (HCC)   Pressure injury of skin   Cellulitis of right leg   Pleural effusion   Pulmonary edema   Systolic congestive heart failure (HCC)   Acute on chronic combined systolic and diastolic CHF (congestive heart failure) ([place])   Coronary artery disease of bypass graft of native heart with stable angina pectoris ([place])   Bacteremia   Discharge Instructions: Discharge Instructions    (HEART FAILURE PATIENTS) Call MD:  Anytime you have any of the following symptoms: 1) 3 pound weight gain in 24 hours or 5 pounds in 1 week 2) shortness of breath, with or without a dry hacking cough 3) swelling in the hands, feet or stomach 4) if you have to sleep on extra pillows at night in order to breathe.   Complete by:  As directed    Call MD for:  persistant dizziness or light-headedness   Complete by:  As directed    Call MD for:  persistant nausea and vomiting   Complete by:  As directed    Call MD for:  severe uncontrolled pain   Complete by:  As directed    Increase activity slowly   Complete by:  As directed      Allergies as of 01/07/2019      Reactions   Bee Venom    Tradjenta [linagliptin]    Codeine Nausea Only, Other (See Comments)   Other reaction(s): Other (See Comments) headache      Medication List    STOP taking these medications   amiodarone 200 MG tablet Commonly known as:  PACERONE   linagliptin 5 MG Tabs tablet Commonly known as:  Tradjenta     TAKE these medications   atorvastatin 20 MG tablet Commonly known as:  LIPITOR Take 1 tablet (20 mg total) by mouth daily at 6 PM.   cefdinir 300 MG capsule Commonly known as:   OMNICEF Take 2 capsules (600 mg total) by mouth daily for 3 days.   cholecalciferol 1000 units tablet Commonly known as:  VITAMIN D Take 1,000 Units by mouth daily.   colestipol 1 g tablet Commonly known as:  COLESTID TAKE 1 TABLET BY MOUTH 30 MINUTES BEFORE BREAKFAST AND SUPPER What changed:  See the new instructions.   Eliquis 5 MG Tabs tablet Generic drug:  apixaban Take 5 mg by mouth 2 (two) times daily.   insulin NPH-regular Human (70-30) 100 UNIT/ML injection Inject 12 Units into the skin 2 (two) times daily with a meal. What changed:    how much to take  when to take this  additional instructions   Magnesium 400 MG Caps Take 1 capsule by mouth daily.   metoprolol succinate 50 MG 24 hr tablet Commonly known as:  TOPROL-XL Take 1 tablet (50 mg total)  by mouth daily. Take with or immediately following a meal. What changed:    how much to take  how to take this  when to take this  additional instructions   multivitamin tablet Take 1 tablet by mouth daily.   nitroGLYCERIN 0.4 MG SL tablet Commonly known as:  Nitrostat Place 1 tablet (0.4 mg total) under the tongue every 5 (five) minutes as needed for chest pain.   omeprazole 20 MG capsule Commonly known as:  PRILOSEC TAKE 1 CAPSULE BY MOUTH EVERY DAY What changed:  how much to take   potassium chloride SA 20 MEQ tablet Commonly known as:  K-DUR Take 1 tablet (20 mEq total) by mouth daily.   tiZANidine 4 MG tablet Commonly known as:  ZANAFLEX Take 4 mg by mouth at bedtime.   torsemide 20 MG tablet Commonly known as:  DEMADEX Take 2 tablets (40 mg total) by mouth daily. What changed:  how much to take   travoprost (benzalkonium) 0.004 % ophthalmic solution Commonly known as:  TRAVATAN Place 1 drop into both eyes at bedtime.      Follow-up Information    [facility] on 01/13/2019.   Why:  Please follow up with Dr. Pleas Koch on Monday, June 8th at 9:30am. Contact  information: [place] [address] [phone]        Erma Heritage, [place]. Schedule an appointment as soon as possible for a visit in 2 week(s).   Specialties:  Physician Assistant, Cardiology Why:  Hospital Follow Up Contact information: [place] [address] [phone]        Curlene Labrum, MD. Schedule an appointment as soon as possible for a visit in 2 week(s).   Specialty:  Family Medicine Why:  Hospital Follow Up  Contact information: [place] [address] [phone]        Herminio Commons, MD. Schedule an appointment as soon as possible for a visit in 4 week(s).   Specialty:  Cardiology Contact information: [place] [address] [phone]          Allergies  Allergen Reactions  . Bee Venom   . Tradjenta [Linagliptin]   . Codeine Nausea Only and Other (See Comments)    Other reaction(s): Other (See Comments) headache   Allergies as of 01/07/2019      Reactions   Bee Venom    Tradjenta [linagliptin]    Codeine Nausea Only, Other (See Comments)   Other reaction(s): Other (See Comments) headache      Medication List    STOP taking these medications   amiodarone 200 MG tablet Commonly known as:  PACERONE   linagliptin 5 MG Tabs tablet Commonly known as:  Tradjenta     TAKE these medications   atorvastatin 20 MG tablet Commonly known as:  LIPITOR Take 1 tablet (20 mg total) by mouth daily at 6 PM.   cefdinir 300 MG capsule Commonly known as:  OMNICEF Take 2 capsules (600 mg total) by mouth daily for 3 days.   cholecalciferol 1000 units tablet Commonly known as:  VITAMIN D Take 1,000 Units by mouth daily.   colestipol 1 g tablet Commonly known as:  COLESTID TAKE 1 TABLET BY MOUTH 30 MINUTES BEFORE BREAKFAST AND SUPPER What changed:  See the new instructions.   Eliquis 5 MG Tabs tablet Generic drug:  apixaban Take 5 mg by mouth 2 (two) times daily.   insulin  NPH-regular Human (70-30) 100 UNIT/ML injection Inject 12 Units into the skin 2 (two) times daily with a meal. What changed:    how much to take  when to take this  additional instructions   Magnesium 400 MG Caps Take 1 capsule by mouth daily.   metoprolol succinate 50 MG 24 hr tablet Commonly known as:  TOPROL-XL Take 1 tablet (50 mg total) by mouth daily. Take with or immediately following a meal. What changed:    how much to take  how to take this  when to take this  additional instructions   multivitamin tablet Take 1 tablet by mouth daily.   nitroGLYCERIN 0.4 MG SL tablet Commonly known as:  Nitrostat Place 1 tablet (0.4 mg total) under the tongue every 5 (five) minutes as needed for chest pain.   omeprazole 20 MG capsule Commonly known as:  PRILOSEC TAKE 1 CAPSULE BY MOUTH EVERY DAY What changed:  how much to take   potassium chloride SA 20 MEQ tablet Commonly known as:  K-DUR Take 1 tablet (20 mEq total) by mouth daily.   tiZANidine 4 MG tablet Commonly known as:  ZANAFLEX Take 4 mg by mouth at bedtime.   torsemide 20 MG tablet Commonly known as:  DEMADEX Take 2 tablets (40 mg total) by mouth daily. What changed:  how much to take   travoprost (benzalkonium) 0.004 % ophthalmic solution Commonly known as:  TRAVATAN Place 1 drop into both eyes at bedtime.       Procedures/Studies: Dg Chest Port 1 View  Result Date: 01/06/2019 CLINICAL DATA:  68 year old female with acute on chronic combined diastolic and systolic dysfunction, CHF exacerbation. EXAM: PORTABLE CHEST 1 VIEW COMPARISON:  01/04/2019 and earlier. FINDINGS: Portable AP upright view at 0515 hours. Stable right IJ central line. Stable cardiomegaly and mediastinal contours. Continued veiling opacity at the right lung base. No pneumothorax. Continued pulmonary vascular congestion and/or mild interstitial edema. No confluent opacity in the left lung. Paucity of bowel gas in the upper abdomen.  IMPRESSION: Stable probable small right pleural effusion with vascular congestion and/or mild interstitial edema Electronically Signed   By: Genevie Ann M.D.   On: 01/06/2019 07:06   Dg Chest Port 1 View  Result Date: 01/04/2019 CLINICAL DATA:  PICC line placementPost central line placement EXAM: PORTABLE CHEST 1 VIEW COMPARISON:  01/03/2019 FINDINGS: RIGHT IJ catheter with tip in the distal SVC. Normal cardiac silhouette. Enlarged cardiac silhouette. No pneumothorax. Bilateral moderate pleural effusions. Central venous congestion IMPRESSION: 1. RIGHT central venous line in place without complication. 2. Moderate bilateral effusions and pulmonary venous congestion. Electronically Signed   By: Suzy Bouchard M.D.   On: 01/04/2019 15:00   Dg Chest Port 1 View  Result Date: 01/03/2019 CLINICAL DATA:  Pleural effusion follow-up EXAM: PORTABLE CHEST 1 VIEW COMPARISON:  Yesterday FINDINGS: Cardiomegaly. Prior median sternotomy. Right pleural effusion causing hazy density at the base. No pulmonary edema. Atelectasis or scarring at the level of the lingula. IMPRESSION: Stable cardiomegaly and small right pleural effusion. Electronically Signed   By: Monte Fantasia M.D.   On: 01/03/2019 08:34   Dg Chest Port 1 View  Result Date: 01/02/2019 CLINICAL DATA:  Worsening shortness of breath. EXAM: PORTABLE CHEST 1 VIEW COMPARISON:  01/01/2019. FINDINGS: Prior median sternotomy. Cardiomegaly. Diffuse bilateral pulmonary infiltrates/edema again noted. Small right pleural effusion again noted. No interim change. No pneumothorax. IMPRESSION: Prior median sternotomy. Cardiomegaly with diffuse bilateral pulmonary infiltrates/edema and small right pleural effusion again noted. Findings suggest  CHF. No change noted from prior exam. Electronically Signed   By: Marcello Moores  Register   On: 01/02/2019 08:00   Dg Chest Portable 1 View  Result Date: 01/01/2019 CLINICAL DATA:  Shortness of breath with fluid retention EXAM: PORTABLE  CHEST 1 VIEW COMPARISON:  11/29/2018 FINDINGS: Cardiomegaly and vascular pedicle widening. Prior median sternotomy for CABG There is haziness of the lower right chest. Vascular congestion. The left lung is relatively clear with only mild atelectasis or scarring the lingula. IMPRESSION: Cardiomegaly and vascular congestion with right pleural effusion. Electronically Signed   By: Monte Fantasia M.D.   On: 01/01/2019 04:58     Subjective: Pt sitting up in chair, feels much better, wants to go to rehab, willing to participate  Discharge Exam: Vitals:   01/07/19 0816 01/07/19 1320  BP:  108/69  Pulse:  72  Resp:  16  Temp:  98.5 F (36.9 C)  SpO2: 97% 100%   Vitals:   01/06/19 2111 01/07/19 0541 01/07/19 0816 01/07/19 1320  BP: 121/65 94/77  108/69  Pulse: 96   72  Resp: (!) 24 20  16   Temp: 98.3 F (36.8 C) 97.9 F (36.6 C)  98.5 F (36.9 C)  TempSrc: Oral Oral  Oral  SpO2: 100%  97% 100%  Weight:  112.9 kg    Height:       General exam: awake, alert, NAD, cooperative.  Respiratory system: Clear. No increased work of breathing. Cardiovascular system: S1 & S2 heard. No JVD, murmurs, gallops, clicks or pedal edema. Gastrointestinal system: Abdomen is nondistended, soft and nontender. Normal bowel sounds heard. Central nervous system: Alert and oriented. No focal neurological deficits. Extremities: cellulitis resolved,  Redness resolved, trace edema.   The results of significant diagnostics from this hospitalization (including imaging, microbiology, ancillary and laboratory) are listed below for reference.     Microbiology: Recent Results (from the past 240 hour(s))  SARS Coronavirus 2 (CEPHEID- Performed in [place] hospital lab), Hosp Order     Status: None   Collection Time: 01/01/19  4:54 AM  Result Value Ref Range Status   SARS Coronavirus 2 NEGATIVE NEGATIVE Final    Comment: (NOTE) If result is NEGATIVE SARS-CoV-2 target nucleic acids are NOT DETECTED. The  SARS-CoV-2 RNA is generally detectable in upper and lower  respiratory specimens during the acute phase of infection. The lowest  concentration of SARS-CoV-2 viral copies this assay can detect is 250  copies / mL. A negative result does not preclude SARS-CoV-2 infection  and should not be used as the sole basis for treatment or other  patient management decisions.  A negative result may occur with  improper specimen collection / handling, submission of specimen other  than nasopharyngeal swab, presence of viral mutation(s) within the  areas targeted by this assay, and inadequate number of viral copies  (<250 copies / mL). A negative result must be combined with clinical  observations, patient history, and epidemiological information. If result is POSITIVE SARS-CoV-2 target nucleic acids are DETECTED. The SARS-CoV-2 RNA is generally detectable in upper and lower  respiratory specimens dur ing the acute phase of infection.  Positive  results are indicative of active infection with SARS-CoV-2.  Clinical  correlation with patient history and other diagnostic information is  necessary to determine patient infection status.  Positive results do  not rule out bacterial infection or co-infection with other viruses. If result is PRESUMPTIVE POSTIVE SARS-CoV-2 nucleic acids MAY BE PRESENT.   A presumptive positive result was  obtained on the submitted specimen  and confirmed on repeat testing.  While 2019 novel coronavirus  (SARS-CoV-2) nucleic acids may be present in the submitted sample  additional confirmatory testing may be necessary for epidemiological  and / or clinical management purposes  to differentiate between  SARS-CoV-2 and other Sarbecovirus currently known to infect humans.  If clinically indicated additional testing with an alternate test  methodology [phone]) is advised. The SARS-CoV-2 RNA is generally  detectable in upper and lower respiratory sp ecimens during the acute   phase of infection. The expected result is Negative. Fact Sheet for Patients:  StrictlyIdeas.no Fact Sheet for Healthcare Providers: BankingDealers.co.za This test is not yet approved or cleared by the [place] FDA and has been authorized for detection and/or diagnosis of SARS-CoV-2 by FDA under an Emergency Use Authorization (EUA).  This EUA will remain in effect (meaning this test can be used) for the duration of the COVID-19 declaration under Section 564(b)(1) of the Act, 21 U.S.C. section 360bbb-3(b)(1), unless the authorization is terminated or revoked sooner. Performed at [facility], [address] [place], [place] [address]   Blood Culture (routine x 2)     Status: Abnormal   Collection Time: 01/01/19  5:17 AM  Result Value Ref Range Status   Specimen Description   Final    BLOOD RIGHT ARM Performed at [facility], [address], [place], [place] [address]    Special Requests   Final    BOTTLES DRAWN AEROBIC AND ANAEROBIC Blood Culture adequate volume Performed at [facility], [address], [place], [place] [address]    Culture  Setup Time   Final    GRAM POSITIVE COCCI Gram Stain Report Called to,Read Back By and Verified With: TAYLOR,M ON 01/01/19 AT 1840 BY LOY,C AEROBIC BOTTLE PERFORMED AT [facility]    Culture STREPTOCOCCUS GROUP G (A)  Final   Report Status 01/03/2019 FINAL  Final   Organism ID, Bacteria STREPTOCOCCUS GROUP G  Final      Susceptibility   Streptococcus group g - MIC*    CLINDAMYCIN RESISTANT Resistant     AMPICILLIN <=0.25 SENSITIVE Sensitive     ERYTHROMYCIN >=8 RESISTANT Resistant     VANCOMYCIN 0.5 SENSITIVE Sensitive     CEFTRIAXONE <=0.12 SENSITIVE Sensitive     LEVOFLOXACIN 0.5 SENSITIVE Sensitive     * STREPTOCOCCUS GROUP G  Blood Culture ID Panel (Reflexed)     Status: Abnormal   Collection Time: 01/01/19  5:17 AM  Result Value Ref Range Status   Enterococcus species NOT DETECTED NOT  DETECTED Final   Listeria monocytogenes NOT DETECTED NOT DETECTED Final   Staphylococcus species NOT DETECTED NOT DETECTED Final   Staphylococcus aureus (BCID) NOT DETECTED NOT DETECTED Final   Streptococcus species DETECTED (A) NOT DETECTED Final    Comment: Not Enterococcus species, Streptococcus agalactiae, Streptococcus pyogenes, or Streptococcus pneumoniae. CRITICAL RESULT CALLED TO, READ BACK BY AND VERIFIED WITH: RN T BINES @0230  01/02/19 BY S GEZAHEGN    Streptococcus agalactiae NOT DETECTED NOT DETECTED Final   Streptococcus pneumoniae NOT DETECTED NOT DETECTED Final   Streptococcus pyogenes NOT DETECTED NOT DETECTED Final   Acinetobacter baumannii NOT DETECTED NOT DETECTED Final   Enterobacteriaceae species NOT DETECTED NOT DETECTED Final   Enterobacter cloacae complex NOT DETECTED NOT DETECTED Final   Escherichia coli NOT DETECTED NOT DETECTED Final   Klebsiella oxytoca NOT DETECTED NOT DETECTED Final   Klebsiella pneumoniae NOT DETECTED NOT DETECTED Final   Proteus species NOT DETECTED  NOT DETECTED Final   Serratia marcescens NOT DETECTED NOT DETECTED Final   Haemophilus influenzae NOT DETECTED NOT DETECTED Final   Neisseria meningitidis NOT DETECTED NOT DETECTED Final   Pseudomonas aeruginosa NOT DETECTED NOT DETECTED Final   Candida albicans NOT DETECTED NOT DETECTED Final   Candida glabrata NOT DETECTED NOT DETECTED Final   Candida krusei NOT DETECTED NOT DETECTED Final   Candida parapsilosis NOT DETECTED NOT DETECTED Final   Candida tropicalis NOT DETECTED NOT DETECTED Final    Comment: Performed at [facility], [place] [address] [place], [place] [address]  Blood Culture (routine x 2)     Status: None   Collection Time: 01/01/19  5:27 AM  Result Value Ref Range Status   Specimen Description BLOOD LEFT ARM  Final   Special Requests   Final    BOTTLES DRAWN AEROBIC ONLY Blood Culture results may not be optimal due to an inadequate volume of blood received in  culture bottles   Culture   Final    NO GROWTH 5 DAYS Performed at [facility], [address] [place], [place] [address]    Report Status 01/06/2019 FINAL  Final  SARS Coronavirus 2 (CEPHEID - Performed in [place] hospital lab), Hosp Order     Status: None   Collection Time: 01/06/19  6:33 PM  Result Value Ref Range Status   SARS Coronavirus 2 NEGATIVE NEGATIVE Final    Comment: (NOTE) If result is NEGATIVE SARS-CoV-2 target nucleic acids are NOT DETECTED. The SARS-CoV-2 RNA is generally detectable in upper and lower  respiratory specimens during the acute phase of infection. The lowest  concentration of SARS-CoV-2 viral copies this assay can detect is 250  copies / mL. A negative result does not preclude SARS-CoV-2 infection  and should not be used as the sole basis for treatment or other  patient management decisions.  A negative result may occur with  improper specimen collection / handling, submission of specimen other  than nasopharyngeal swab, presence of viral mutation(s) within the  areas targeted by this assay, and inadequate number of viral copies  (<250 copies / mL). A negative result must be combined with clinical  observations, patient history, and epidemiological information. If result is POSITIVE SARS-CoV-2 target nucleic acids are DETECTED. The SARS-CoV-2 RNA is generally detectable in upper and lower  respiratory specimens dur ing the acute phase of infection.  Positive  results are indicative of active infection with SARS-CoV-2.  Clinical  correlation with patient history and other diagnostic information is  necessary to determine patient infection status.  Positive results do  not rule out bacterial infection or co-infection with other viruses. If result is PRESUMPTIVE POSTIVE SARS-CoV-2 nucleic acids MAY BE PRESENT.   A presumptive positive result was obtained on the submitted specimen  and confirmed on repeat testing.  While 2019 novel coronavirus   (SARS-CoV-2) nucleic acids may be present in the submitted sample  additional confirmatory testing may be necessary for epidemiological  and / or clinical management purposes  to differentiate between  SARS-CoV-2 and other Sarbecovirus currently known to infect humans.  If clinically indicated additional testing with an alternate test  methodology [phone]) is advised. The SARS-CoV-2 RNA is generally  detectable in upper and lower respiratory sp ecimens during the acute  phase of infection. The expected result is Negative. Fact Sheet for Patients:  StrictlyIdeas.no Fact Sheet for Healthcare Providers: BankingDealers.co.za This test is not yet approved or cleared by the [place] FDA and  has been authorized for detection and/or diagnosis of SARS-CoV-2 by FDA under an Emergency Use Authorization (EUA).  This EUA will remain in effect (meaning this test can be used) for the duration of the COVID-19 declaration under Section 564(b)(1) of the Act, 21 U.S.C. section 360bbb-3(b)(1), unless the authorization is terminated or revoked sooner. Performed at [facility], [address], [place] [place], [place] [address]      Labs: BNP (last 3 results) Recent Labs    11/29/18 0915 01/01/19 0517  BNP 1,482.0* 6,659.9*   Basic Metabolic Panel: Recent Labs  Lab 01/03/19 0856 01/04/19 0616 01/05/19 0626 01/06/19 0525 01/07/19 1050  NA 138 140 138 139 140  K 4.3 4.5 3.8 4.2 4.1  CL 103 103 102 103 101  CO2 25 24 25 27 26   GLUCOSE 184* 127* 133* 174* 210*  BUN 58* 57* 49* 43* 39*  CREATININE 2.42* 2.07* 1.70* 1.67* 1.54*  CALCIUM 8.4* 8.4* 8.3* 8.4* 8.5*  MG  --   --   --  2.1  --    Liver Function Tests: Recent Labs  Lab 01/01/19 0517  AST 25  ALT 17  ALKPHOS 92  BILITOT 1.9*  PROT 6.9  ALBUMIN 3.7   No results for input(s): LIPASE, AMYLASE in the last 168 hours. No results for input(s): AMMONIA in the last 168  hours. CBC: Recent Labs  Lab 01/01/19 0517 01/02/19 0456 01/04/19 0616 01/05/19 0626 01/06/19 0525 01/07/19 1050  WBC 14.8* 5.9 4.1 3.5* 4.0 5.0  NEUTROABS 13.4*  --   --   --   --   --   HGB 9.3* 7.2* 7.7* 7.7* 7.6* 8.2*  HCT 32.0* 25.8* 27.6* 26.5* 26.6* 29.0*  MCV 78.8* 79.9* 80.0 79.3* 79.2* 80.3  PLT 139* 94* 106* 115* 123* 145*   Cardiac Enzymes: Recent Labs  Lab 01/01/19 0517  TROPONINI 0.03*   BNP: Invalid input(s): POCBNP CBG: Recent Labs  Lab 01/06/19 1624 01/06/19 2112 01/07/19 0304 01/07/19 0718 01/07/19 1120  GLUCAP 180* 182* 191* 169* 253*   D-Dimer No results for input(s): DDIMER in the last 72 hours. Hgb A1c No results for input(s): HGBA1C in the last 72 hours. Lipid Profile No results for input(s): CHOL, HDL, LDLCALC, TRIG, CHOLHDL, LDLDIRECT in the last 72 hours. Thyroid function studies No results for input(s): TSH, T4TOTAL, T3FREE, THYROIDAB in the last 72 hours.  Invalid input(s): FREET3 Anemia work up No results for input(s): VITAMINB12, FOLATE, FERRITIN, TIBC, IRON, RETICCTPCT in the last 72 hours. Urinalysis    Component Value Date/Time   COLORURINE YELLOW 01/01/2019 0433   APPEARANCEUR HAZY (A) 01/01/2019 0433   LABSPEC 1.009 01/01/2019 0433   PHURINE 5.0 01/01/2019 0433   GLUCOSEU NEGATIVE 01/01/2019 0433   HGBUR LARGE (A) 01/01/2019 0433   BILIRUBINUR NEGATIVE 01/01/2019 0433   KETONESUR NEGATIVE 01/01/2019 0433   PROTEINUR NEGATIVE 01/01/2019 0433   UROBILINOGEN 0.2 11/05/2008 0627   NITRITE NEGATIVE 01/01/2019 0433   LEUKOCYTESUR NEGATIVE 01/01/2019 0433   Sepsis Labs Invalid input(s): PROCALCITONIN,  WBC,  LACTICIDVEN Microbiology Recent Results (from the past 240 hour(s))  SARS Coronavirus 2 (CEPHEID- Performed in [place] hospital lab), Hosp Order     Status: None   Collection Time: 01/01/19  4:54 AM  Result Value Ref Range Status   SARS Coronavirus 2 NEGATIVE NEGATIVE Final    Comment: (NOTE) If result is  NEGATIVE SARS-CoV-2 target nucleic acids are NOT DETECTED. The SARS-CoV-2 RNA is generally detectable in upper and lower  respiratory specimens during  the acute phase of infection. The lowest  concentration of SARS-CoV-2 viral copies this assay can detect is 250  copies / mL. A negative result does not preclude SARS-CoV-2 infection  and should not be used as the sole basis for treatment or other  patient management decisions.  A negative result may occur with  improper specimen collection / handling, submission of specimen other  than nasopharyngeal swab, presence of viral mutation(s) within the  areas targeted by this assay, and inadequate number of viral copies  (<250 copies / mL). A negative result must be combined with clinical  observations, patient history, and epidemiological information. If result is POSITIVE SARS-CoV-2 target nucleic acids are DETECTED. The SARS-CoV-2 RNA is generally detectable in upper and lower  respiratory specimens dur ing the acute phase of infection.  Positive  results are indicative of active infection with SARS-CoV-2.  Clinical  correlation with patient history and other diagnostic information is  necessary to determine patient infection status.  Positive results do  not rule out bacterial infection or co-infection with other viruses. If result is PRESUMPTIVE POSTIVE SARS-CoV-2 nucleic acids MAY BE PRESENT.   A presumptive positive result was obtained on the submitted specimen  and confirmed on repeat testing.  While 2019 novel coronavirus  (SARS-CoV-2) nucleic acids may be present in the submitted sample  additional confirmatory testing may be necessary for epidemiological  and / or clinical management purposes  to differentiate between  SARS-CoV-2 and other Sarbecovirus currently known to infect humans.  If clinically indicated additional testing with an alternate test  methodology [phone]) is advised. The SARS-CoV-2 RNA is generally  detectable  in upper and lower respiratory sp ecimens during the acute  phase of infection. The expected result is Negative. Fact Sheet for Patients:  StrictlyIdeas.no Fact Sheet for Healthcare Providers: BankingDealers.co.za This test is not yet approved or cleared by the [place] FDA and has been authorized for detection and/or diagnosis of SARS-CoV-2 by FDA under an Emergency Use Authorization (EUA).  This EUA will remain in effect (meaning this test can be used) for the duration of the COVID-19 declaration under Section 564(b)(1) of the Act, 21 U.S.C. section 360bbb-3(b)(1), unless the authorization is terminated or revoked sooner. Performed at [facility], [address], [place], [place] [address]   Blood Culture (routine x 2)     Status: Abnormal   Collection Time: 01/01/19  5:17 AM  Result Value Ref Range Status   Specimen Description   Final    BLOOD RIGHT ARM Performed at [facility], [address], [place], [place] [address]    Special Requests   Final    BOTTLES DRAWN AEROBIC AND ANAEROBIC Blood Culture adequate volume Performed at [place] [place], [place] [address]    Culture  Setup Time   Final    GRAM POSITIVE COCCI Gram Stain Report Called to,Read Back By and Verified With: TAYLOR,M ON 01/01/19 AT 1840 BY LOY,C AEROBIC BOTTLE PERFORMED AT [facility]    Culture STREPTOCOCCUS GROUP G (A)  Final   Report Status 01/03/2019 FINAL  Final   Organism ID, Bacteria STREPTOCOCCUS GROUP G  Final      Susceptibility   Streptococcus group g - MIC*    CLINDAMYCIN RESISTANT Resistant     AMPICILLIN <=0.25 SENSITIVE Sensitive     ERYTHROMYCIN >=8 RESISTANT Resistant     VANCOMYCIN 0.5 SENSITIVE Sensitive     CEFTRIAXONE <=0.12 SENSITIVE Sensitive     LEVOFLOXACIN 0.5 SENSITIVE Sensitive     *  STREPTOCOCCUS GROUP G  Blood Culture ID Panel (Reflexed)     Status: Abnormal   Collection Time: 01/01/19  5:17 AM  Result Value  Ref Range Status   Enterococcus species NOT DETECTED NOT DETECTED Final   Listeria monocytogenes NOT DETECTED NOT DETECTED Final   Staphylococcus species NOT DETECTED NOT DETECTED Final   Staphylococcus aureus (BCID) NOT DETECTED NOT DETECTED Final   Streptococcus species DETECTED (A) NOT DETECTED Final    Comment: Not Enterococcus species, Streptococcus agalactiae, Streptococcus pyogenes, or Streptococcus pneumoniae. CRITICAL RESULT CALLED TO, READ BACK BY AND VERIFIED WITH: RN T BINES @0230  01/02/19 BY S GEZAHEGN    Streptococcus agalactiae NOT DETECTED NOT DETECTED Final   Streptococcus pneumoniae NOT DETECTED NOT DETECTED Final   Streptococcus pyogenes NOT DETECTED NOT DETECTED Final   Acinetobacter baumannii NOT DETECTED NOT DETECTED Final   Enterobacteriaceae species NOT DETECTED NOT DETECTED Final   Enterobacter cloacae complex NOT DETECTED NOT DETECTED Final   Escherichia coli NOT DETECTED NOT DETECTED Final   Klebsiella oxytoca NOT DETECTED NOT DETECTED Final   Klebsiella pneumoniae NOT DETECTED NOT DETECTED Final   Proteus species NOT DETECTED NOT DETECTED Final   Serratia marcescens NOT DETECTED NOT DETECTED Final   Haemophilus influenzae NOT DETECTED NOT DETECTED Final   Neisseria meningitidis NOT DETECTED NOT DETECTED Final   Pseudomonas aeruginosa NOT DETECTED NOT DETECTED Final   Candida albicans NOT DETECTED NOT DETECTED Final   Candida glabrata NOT DETECTED NOT DETECTED Final   Candida krusei NOT DETECTED NOT DETECTED Final   Candida parapsilosis NOT DETECTED NOT DETECTED Final   Candida tropicalis NOT DETECTED NOT DETECTED Final    Comment: Performed at [facility], [place] [address] [place], [place] [address]  Blood Culture (routine x 2)     Status: None   Collection Time: 01/01/19  5:27 AM  Result Value Ref Range Status   Specimen Description BLOOD LEFT ARM  Final   Special Requests   Final    BOTTLES DRAWN AEROBIC ONLY Blood Culture results may not be  optimal due to an inadequate volume of blood received in culture bottles   Culture   Final    NO GROWTH 5 DAYS Performed at [facility], [address] [place], [place] [address]    Report Status 01/06/2019 FINAL  Final  SARS Coronavirus 2 (CEPHEID - Performed in [place] hospital lab), Hosp Order     Status: None   Collection Time: 01/06/19  6:33 PM  Result Value Ref Range Status   SARS Coronavirus 2 NEGATIVE NEGATIVE Final    Comment: (NOTE) If result is NEGATIVE SARS-CoV-2 target nucleic acids are NOT DETECTED. The SARS-CoV-2 RNA is generally detectable in upper and lower  respiratory specimens during the acute phase of infection. The lowest  concentration of SARS-CoV-2 viral copies this assay can detect is 250  copies / mL. A negative result does not preclude SARS-CoV-2 infection  and should not be used as the sole basis for treatment or other  patient management decisions.  A negative result may occur with  improper specimen collection / handling, submission of specimen other  than nasopharyngeal swab, presence of viral mutation(s) within the  areas targeted by this assay, and inadequate number of viral copies  (<250 copies / mL). A negative result must be combined with clinical  observations, patient history, and epidemiological information. If result is POSITIVE SARS-CoV-2 target nucleic acids are DETECTED. The SARS-CoV-2 RNA is generally detectable in upper and lower  respiratory specimens dur ing the acute phase of infection.  Positive  results are indicative of active infection with SARS-CoV-2.  Clinical  correlation with patient history and other diagnostic information is  necessary to determine patient infection status.  Positive results do  not rule out bacterial infection or co-infection with other viruses. If result is PRESUMPTIVE POSTIVE SARS-CoV-2 nucleic acids MAY BE PRESENT.   A presumptive positive result was obtained on the submitted specimen  and  confirmed on repeat testing.  While 2019 novel coronavirus  (SARS-CoV-2) nucleic acids may be present in the submitted sample  additional confirmatory testing may be necessary for epidemiological  and / or clinical management purposes  to differentiate between  SARS-CoV-2 and other Sarbecovirus currently known to infect humans.  If clinically indicated additional testing with an alternate test  methodology [phone]) is advised. The SARS-CoV-2 RNA is generally  detectable in upper and lower respiratory sp ecimens during the acute  phase of infection. The expected result is Negative. Fact Sheet for Patients:  StrictlyIdeas.no Fact Sheet for Healthcare Providers: BankingDealers.co.za This test is not yet approved or cleared by the [place] FDA and has been authorized for detection and/or diagnosis of SARS-CoV-2 by FDA under an Emergency Use Authorization (EUA).  This EUA will remain in effect (meaning this test can be used) for the duration of the COVID-19 declaration under Section 564(b)(1) of the Act, 21 U.S.C. section 360bbb-3(b)(1), unless the authorization is terminated or revoked sooner. Performed at [facility], [address], [place], [place] [address]     Time coordinating discharge: 35 minutes  SIGNED:  Irwin Brakeman, MD  [facility] 01/07/2019, 1:31 PM How to contact the [facility] Attending or Consulting provider [place] or covering provider during after hours [place], for this patient?  1. Check the care team in [facility] and look for a) attending/consulting TR[facility] provider listed and b) the [facility] team listed 2. Log into [email or web address] and use [place]'s universal password to access. If you do not have the password, please contact the hospital operator. 3. Locate the [facility] provider you are looking for under [facility] and page to a number that you can be directly reached. 4. If you still have difficulty reaching the  provider, please page the [facility] (Director on Call) for the Hospitalists listed on amion for assistance.

## 2019-01-07 NOTE — Care Management (Addendum)
DC clinicals faxed. Notified Rebecca with [employer]. Bedside RN to call son and notify him to pick up patient.   ADDENDUM: [facility] states they do NOT have auth yet. DC on hold. Patient updated, she is calling son.

## 2019-01-07 NOTE — Progress Notes (Addendum)
Progress Note  Patient Name: Erica [name] Date of Encounter: 01/07/2019  Primary Cardiologist: Kate Sable, MD   Subjective   Sitting up in chair, doing well and without complaints.  Leg swelling continues to improve.  Inpatient Medications    Scheduled Meds: . apixaban  5 mg Oral BID  . atorvastatin  20 mg Oral q1800  . cholecalciferol  1,000 Units Oral Daily  . colestipol  1 g Oral Daily  . furosemide  80 mg Intravenous Q12H  . insulin aspart  0-5 Units Subcutaneous QHS  . insulin aspart  0-9 Units Subcutaneous TID WC  . insulin aspart  4 Units Subcutaneous TID WC  . latanoprost  1 drop Both Eyes QHS  . magnesium oxide  400 mg Oral Daily  . metoprolol succinate  50 mg Oral Daily  . multivitamin with minerals  1 tablet Oral Daily  . pantoprazole  40 mg Oral Daily  . potassium chloride SA  20 mEq Oral Daily  . sodium chloride flush  3 mL Intravenous Q12H  . tiZANidine  4 mg Oral QHS   Continuous Infusions: . sodium chloride    . sodium chloride 250 mL (01/04/19 1732)  . cefTRIAXone (ROCEPHIN)  IV 2 g (01/06/19 1831)   PRN Meds: sodium chloride, sodium chloride, acetaminophen **OR** acetaminophen, albuterol, nitroGLYCERIN, ondansetron **OR** ondansetron (ZOFRAN) IV, polyethylene glycol, sodium chloride flush, traZODone   Vital Signs    Vitals:   01/06/19 1311 01/06/19 2111 01/07/19 0541 01/07/19 0816  BP: (!) 101/52 121/65 94/77   Pulse: 88 96    Resp: 18 (!) 24 20   Temp: 97.8 F (36.6 C) 98.3 F (36.8 C) 97.9 F (36.6 C)   TempSrc: Oral Oral Oral   SpO2: 100% 100%  97%  Weight:   112.9 kg   Height:        Intake/Output Summary (Last 24 hours) at 01/07/2019 0830 Last data filed at 01/06/2019 1845 Gross per 24 hour  Intake 483 ml  Output 900 ml  Net -417 ml   Last 3 Weights 01/07/2019 01/05/2019 01/04/2019  Weight (lbs) 249 lb 261 lb 14.5 oz 258 lb 13.1 oz  Weight (kg) 112.946 kg 118.8 kg 117.4 kg      Telemetry    Atrial fibrillation, 70 bpm  range- Personally Reviewed   Physical Exam   GEN: No acute distress.   Neck: No JVD Cardiac:  Irregular rhythm, no murmurs, rubs, or gallops.  Respiratory: Clear to auscultation bilaterally. GI: Soft, nontender, non-distended  MS:  1+ pitting bilateral lower extremity edema, right greater than left, mild erythema of right calf; No deformity. Neuro:  Nonfocal  Psych: Normal affect   Labs    Chemistry Recent Labs  Lab 01/01/19 0517  01/04/19 0616 01/05/19 0626 01/06/19 0525  NA 139   < > 140 138 139  K 5.1   < > 4.5 3.8 4.2  CL 104   < > 103 102 103  CO2 20*   < > 24 25 27   GLUCOSE 64*   < > 127* 133* 174*  BUN 62*   < > 57* 49* 43*  CREATININE 2.87*   < > 2.07* 1.70* 1.67*  CALCIUM 9.1   < > 8.4* 8.3* 8.4*  PROT 6.9  --   --   --   --   ALBUMIN 3.7  --   --   --   --   AST 25  --   --   --   --  ALT 17  --   --   --   --   ALKPHOS 92  --   --   --   --   BILITOT 1.9*  --   --   --   --   GFRNONAA 16*   < > 24* 31* 32*  GFRAA 19*   < > 28* 36* 37*  ANIONGAP 15   < > 13 11 9    < > = values in this interval not displayed.     Hematology Recent Labs  Lab 01/04/19 0616 01/05/19 0626 01/06/19 0525  WBC 4.1 3.5* 4.0  RBC 3.45* 3.34* 3.36*  HGB 7.7* 7.7* 7.6*  HCT 27.6* 26.5* 26.6*  MCV 80.0 79.3* 79.2*  MCH 22.3* 23.1* 22.6*  MCHC 27.9* 29.1* 28.6*  RDW 20.7* 20.6* 20.5*  PLT 106* 115* 123*    Cardiac Enzymes Recent Labs  Lab 01/01/19 0517  TROPONINI 0.03*   No results for input(s): TROPIPOC in the last 168 hours.   BNP Recent Labs  Lab 01/01/19 0517  BNP 1,987.0*     DDimer No results for input(s): DDIMER in the last 168 hours.   Radiology    Dg Chest Port 1 View  Result Date: 01/06/2019 CLINICAL DATA:  68 year old female with acute on chronic combined diastolic and systolic dysfunction, CHF exacerbation. EXAM: PORTABLE CHEST 1 VIEW COMPARISON:  01/04/2019 and earlier. FINDINGS: Portable AP upright view at 0515 hours. Stable right IJ central  line. Stable cardiomegaly and mediastinal contours. Continued veiling opacity at the right lung base. No pneumothorax. Continued pulmonary vascular congestion and/or mild interstitial edema. No confluent opacity in the left lung. Paucity of bowel gas in the upper abdomen. IMPRESSION: Stable probable small right pleural effusion with vascular congestion and/or mild interstitial edema Electronically Signed   By: Genevie Ann M.D.   On: 01/06/2019 07:06    Cardiac Studies   Echocardiogram: 09/2018 Technically difficult study due to chest wall/lung interference  Severely decreased left ventricular systolic function, ejection fraction  25%  Segmental wall abnormalities (please see below)  Dilated right ventricle - mild  Mildly decreased right ventricular systolic function  Elevated pulmonary artery systolic pressure - mild  Patient Profile     68 y.o. female with CAD, chronic combined systolic and diastolic CHF, HTN, HLD, IDDM, Stage 3 CKD, and prior CVA presented with cellulitis of right leg and acute CHF exacerbation.   Assessment & Plan    1. Acute on Chronic Combined Systolic and Diastolic CHF: Symptomatic improvement.  LVEF 25% by echocardiogram in February 2020 with moderate left atrial enlargement.  She is diuresing well on IV Lasix 80 mg twice daily.  Creatinine pending today.  Approximately 1.4 L output in the past 24 hours.  I will continue current regimen.  Continue Toprol-XL 50 mg daily. I am unable to add ACE inhibitors, angiotensin receptor blockers, or angiotensin receptor-neprilysin inhibitors due to advanced chronic kidney disease. Weight today 248 poundsy.  2. Persistent Atrial Fibrillation: Underwent DCCV last admission but only maintained NSR for a few hours. She is currently on Toprol-XL 50 mg daily.  Due to her multiple comorbidities, I suspect she will have a high recurrence rate if DCCV were to be attempted and have thus elected to proceed with a rate control strategy.  Continue Eliquis for anticoagulation.   3. CAD S/p CABG in 10/2012. She denies any recent chest pain. Has been experiencing dyspnea on exertion as outlined above.  Initial troponin flat at 0.03. Continue BB and  statin therapy. No longer on ASA given the need for anticoagulation.   4. Acute on Chronic Stage 3 CKD Previous baseline creatinine of 1.6 - 1.7.  Currently pending.  5. Bacteremia: Blood cultures positive for streptococcus. Has been started on Ceftriaxone per the admitting team.      For questions or updates, please contact [place] Please consult [email or web address] for contact info under        Signed, Cecilie Kicks, NP  01/07/2019, 8:30 AM    The patient was seen and examined, and I agree with the history, physical exam, assessment and plan as documented above. I have mae modifications to the physical exam and assessment and plan to reflect my thoughts.  Kate Sable, MD, [facility]  01/07/2019 8:59 AM

## 2019-01-07 NOTE — TOC Transition Note (Addendum)
Transition of Care [facility]) - CM/SW Discharge Note   Patient Details  Name: Erica [name] MRN: [id number] Date of Birth: [birth year]  Transition of Care [facility]) CM/SW Contact:  Adarian Bur, Chauncey Reading, RN Phone Number: 01/07/2019, 10:30 AM   Clinical Narrative:   Bed offered and accepted at [facility]. Patient calling son for transport, will send DC clinicals when available.   ADDENDUM: Left VM with [facility] to update. Son will transport patient. Updated [facility] health that patient will be going to SNF. Son needs to pick patient up by 2 pm. Attending and bedside RN updated.   Final next level of care: Skilled Nursing Facility Barriers to Discharge: No Barriers Identified   Patient Goals and CMS Choice Patient states their goals for this hospitalization and ongoing recovery are:: go to SNF and get home CMS Medicare.gov Compare Post Acute Care list provided to:: Patient Choice offered to / list presented to : Patient  Discharge Placement              Patient chooses bed at: [facility] and [place] Patient to be transferred to facility by: family Name of family member notified: patient calling son to transport Patient and family notified of of transfer: 01/07/19  Discharge Plan and Services   Discharge Planning Services: CM Consult Post Acute Care Choice: Home Health, Resumption of Svcs/PTA Provider            Readmission Risk Interventions Readmission Risk Prevention Plan 01/06/2019 01/03/2019 01/02/2019  Transportation Screening Complete - Complete  PCP or Specialist Appt within 5-7 Days - - -  PCP or Specialist Appt within 3-5 Days - Complete -  Home Care Screening - - Complete  Medication Review (RN CM) - - Complete  HRI or Home Care Consult - Complete -  Social Work Consult for Recovery Care Planning/Counseling - Complete -  Palliative Care Screening - Not Applicable -  Medication Review Press photographer) - Complete -  Some recent data might be hidden

## 2019-01-07 NOTE — Progress Notes (Signed)
Discharge paperwork and instruction discussed with patient. Patient actively engaged in discharge learning.  Left IJ pulled. Called report to [facility] and spoke with Casey Burkitt. Elodia Florence RN 01/07/2019 @ 1850

## 2019-01-08 DIAGNOSIS — I509 Heart failure, unspecified: Secondary | ICD-10-CM | POA: Diagnosis not present

## 2019-01-08 DIAGNOSIS — E785 Hyperlipidemia, unspecified: Secondary | ICD-10-CM | POA: Diagnosis not present

## 2019-01-08 DIAGNOSIS — L0390 Cellulitis, unspecified: Secondary | ICD-10-CM | POA: Diagnosis not present

## 2019-01-08 DIAGNOSIS — I4891 Unspecified atrial fibrillation: Secondary | ICD-10-CM | POA: Diagnosis not present

## 2019-01-09 DIAGNOSIS — I509 Heart failure, unspecified: Secondary | ICD-10-CM | POA: Diagnosis not present

## 2019-01-09 DIAGNOSIS — I4891 Unspecified atrial fibrillation: Secondary | ICD-10-CM | POA: Diagnosis not present

## 2019-01-09 DIAGNOSIS — E785 Hyperlipidemia, unspecified: Secondary | ICD-10-CM | POA: Diagnosis not present

## 2019-01-12 DIAGNOSIS — S81801A Unspecified open wound, right lower leg, initial encounter: Secondary | ICD-10-CM | POA: Diagnosis not present

## 2019-01-14 DIAGNOSIS — I509 Heart failure, unspecified: Secondary | ICD-10-CM | POA: Diagnosis not present

## 2019-01-14 DIAGNOSIS — I5043 Acute on chronic combined systolic (congestive) and diastolic (congestive) heart failure: Secondary | ICD-10-CM | POA: Diagnosis not present

## 2019-01-14 DIAGNOSIS — I4819 Other persistent atrial fibrillation: Secondary | ICD-10-CM | POA: Diagnosis not present

## 2019-01-14 DIAGNOSIS — L0390 Cellulitis, unspecified: Secondary | ICD-10-CM | POA: Diagnosis not present

## 2019-01-14 DIAGNOSIS — I10 Essential (primary) hypertension: Secondary | ICD-10-CM | POA: Diagnosis not present

## 2019-01-14 DIAGNOSIS — A491 Streptococcal infection, unspecified site: Secondary | ICD-10-CM | POA: Diagnosis not present

## 2019-01-15 DIAGNOSIS — L89150 Pressure ulcer of sacral region, unstageable: Secondary | ICD-10-CM | POA: Diagnosis not present

## 2019-01-15 DIAGNOSIS — N200 Calculus of kidney: Secondary | ICD-10-CM | POA: Diagnosis not present

## 2019-01-15 DIAGNOSIS — I83018 Varicose veins of right lower extremity with ulcer other part of lower leg: Secondary | ICD-10-CM | POA: Diagnosis not present

## 2019-01-15 DIAGNOSIS — I83012 Varicose veins of right lower extremity with ulcer of calf: Secondary | ICD-10-CM | POA: Diagnosis not present

## 2019-01-15 DIAGNOSIS — N183 Chronic kidney disease, stage 3 (moderate): Secondary | ICD-10-CM | POA: Diagnosis not present

## 2019-01-16 DIAGNOSIS — L0390 Cellulitis, unspecified: Secondary | ICD-10-CM | POA: Diagnosis not present

## 2019-01-16 DIAGNOSIS — I509 Heart failure, unspecified: Secondary | ICD-10-CM | POA: Diagnosis not present

## 2019-01-16 DIAGNOSIS — Z79899 Other long term (current) drug therapy: Secondary | ICD-10-CM | POA: Diagnosis not present

## 2019-01-16 DIAGNOSIS — J189 Pneumonia, unspecified organism: Secondary | ICD-10-CM | POA: Diagnosis not present

## 2019-01-16 DIAGNOSIS — E039 Hypothyroidism, unspecified: Secondary | ICD-10-CM | POA: Diagnosis not present

## 2019-01-16 DIAGNOSIS — E785 Hyperlipidemia, unspecified: Secondary | ICD-10-CM | POA: Diagnosis not present

## 2019-01-16 DIAGNOSIS — M8580 Other specified disorders of bone density and structure, unspecified site: Secondary | ICD-10-CM | POA: Diagnosis not present

## 2019-01-16 DIAGNOSIS — M6281 Muscle weakness (generalized): Secondary | ICD-10-CM | POA: Diagnosis not present

## 2019-01-16 DIAGNOSIS — I517 Cardiomegaly: Secondary | ICD-10-CM | POA: Diagnosis not present

## 2019-01-16 DIAGNOSIS — M1990 Unspecified osteoarthritis, unspecified site: Secondary | ICD-10-CM | POA: Diagnosis not present

## 2019-01-17 DIAGNOSIS — Y95 Nosocomial condition: Secondary | ICD-10-CM | POA: Diagnosis not present

## 2019-01-17 DIAGNOSIS — J189 Pneumonia, unspecified organism: Secondary | ICD-10-CM | POA: Diagnosis not present

## 2019-01-20 DIAGNOSIS — I509 Heart failure, unspecified: Secondary | ICD-10-CM | POA: Diagnosis not present

## 2019-01-20 DIAGNOSIS — R197 Diarrhea, unspecified: Secondary | ICD-10-CM | POA: Diagnosis not present

## 2019-01-21 DIAGNOSIS — A491 Streptococcal infection, unspecified site: Secondary | ICD-10-CM | POA: Diagnosis not present

## 2019-01-21 DIAGNOSIS — R609 Edema, unspecified: Secondary | ICD-10-CM | POA: Diagnosis not present

## 2019-01-21 DIAGNOSIS — A0472 Enterocolitis due to Clostridium difficile, not specified as recurrent: Secondary | ICD-10-CM | POA: Diagnosis not present

## 2019-01-21 DIAGNOSIS — R197 Diarrhea, unspecified: Secondary | ICD-10-CM | POA: Diagnosis not present

## 2019-01-21 DIAGNOSIS — I10 Essential (primary) hypertension: Secondary | ICD-10-CM | POA: Diagnosis not present

## 2019-01-21 DIAGNOSIS — D649 Anemia, unspecified: Secondary | ICD-10-CM | POA: Diagnosis not present

## 2019-01-22 DIAGNOSIS — I509 Heart failure, unspecified: Secondary | ICD-10-CM | POA: Diagnosis not present

## 2019-01-22 DIAGNOSIS — S81801A Unspecified open wound, right lower leg, initial encounter: Secondary | ICD-10-CM | POA: Diagnosis not present

## 2019-01-23 ENCOUNTER — Encounter: Payer: Self-pay | Admitting: Physician Assistant

## 2019-01-23 ENCOUNTER — Telehealth (INDEPENDENT_AMBULATORY_CARE_PROVIDER_SITE_OTHER): Payer: Medicare HMO | Admitting: Physician Assistant

## 2019-01-23 ENCOUNTER — Other Ambulatory Visit: Payer: Self-pay

## 2019-01-23 VITALS — Wt 241.0 lb

## 2019-01-23 DIAGNOSIS — I2510 Atherosclerotic heart disease of native coronary artery without angina pectoris: Secondary | ICD-10-CM

## 2019-01-23 DIAGNOSIS — I5042 Chronic combined systolic (congestive) and diastolic (congestive) heart failure: Secondary | ICD-10-CM

## 2019-01-23 DIAGNOSIS — I4819 Other persistent atrial fibrillation: Secondary | ICD-10-CM

## 2019-01-23 DIAGNOSIS — I509 Heart failure, unspecified: Secondary | ICD-10-CM | POA: Diagnosis not present

## 2019-01-23 DIAGNOSIS — N183 Chronic kidney disease, stage 3 unspecified: Secondary | ICD-10-CM

## 2019-01-23 NOTE — Patient Instructions (Signed)
Medication Instructions: Your physician recommends that you continue on your current medications as directed. Please refer to the Current Medication list given to you today.   Labwork: We will request your lab work from [facility]  Procedures/Testing: none  Follow-Up: 6 weeks with Dr.Koneswaran  Any Additional Special Instructions Will Be Listed Below (If Applicable).     If you need a refill on your cardiac medications before your next appointment, please call your pharmacy.      Thank you for choosing [facility] !

## 2019-01-23 NOTE — Progress Notes (Signed)
Virtual Visit via Telephone Note   This visit type was conducted due to national recommendations for restrictions regarding the COVID-19 Pandemic (e.g. social distancing) in an effort to limit this patient's exposure and mitigate transmission in our community.  Due to her co-morbid illnesses, this patient is at least at moderate risk for complications without adequate follow up.  This format is felt to be most appropriate for this patient at this time.  The patient did not have access to video technology/had technical difficulties with video requiring transitioning to audio format only (telephone).  All issues noted in this document were discussed and addressed.  No physical exam could be performed with this format.  Please refer to the patient's chart for her  consent to telehealth for [facility].  Evaluation Performed:  Follow-up visit  This visit type was conducted due to national recommendations for restrictions regarding the COVID-19 Pandemic (e.g. social distancing).  This format is felt to be most appropriate for this patient at this time.  All issues noted in this document were discussed and addressed.  No physical exam was performed (except for noted visual exam findings with Video Visits).  Please refer to the patient's chart (MyChart message for video visits and phone note for telephone visits) for the patient's consent to telehealth for [facility].  Date:  01/23/2019   ID:  Erica [name], DOB February 26, 1951, MRN [id number]  Patient Location:  Rehab  Provider location:   Office   PCP:  Curlene Labrum, MD  Cardiologist:  Kate Sable, MD 01/07/2019 in-hospital Electrophysiologist:  None   Chief Complaint:  Post-hospital  History of Present Illness:    Erica [name] is a 68 y.o. female who presents via audio/video conferencing for a telehealth visit today.    68 y.o. yo female who has a hx of MI 10/01/2012, s/p CABG w/ LIMA-LAD, SVG-OM, SVG-Diag via Y graft  off OM at [facility], SVG, S-D- CHF w/ EF 50% 2019, HTN, HLD, IDDM, Stage 3 CKD, Afib dx 09/2018 on rate control and Eliquis, and prior CVA   Pt admitted 05/27-06/02 for CHF exacerbation w/ EF now 25%, acute on chronic renal failure w/ Cr 3.87, acute on chronic anemia w/ H&H 7.2/25.8, sepsis 2nd RLE strep cellulitis w/ bacteremia, wt at d/c 112.9 kg, dc to SNF, on BB, amio d/c'd  In rehab, she is doing better, getting stronger, plans to go home Monday.   She is working hard, walking more. She is still on O2, feels she will be on this long term.   Her breathing is doing better, her weight was 241 lbs today. LE edema improving. Denies orthopnea or PND.   Has completed ABX for cellulitis.  If she does too much, will feel her heart race, but it does not last long. Is not generally aware of the Afib.  At home, she checks her weight, BP, HR, Oxygen level and CBG every am.  She has made herself a chart to record all of this.  She will be staying with her daughter at night.  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  She wants to be independent.  The patient does not have symptoms concerning for COVID-19 infection (fever, chills, cough, or new shortness of breath).    Prior CV studies:   The following studies were reviewed today:  Echocardiogram: 09/2018 Technically  difficult study due to chest wall/lung interference  Severely decreased left ventricular systolic function, ejection fraction  25%  Segmental wall abnormalities (please see below)  Dilated right ventricle - mild  Mildly decreased right ventricular systolic function  Elevated pulmonary artery systolic pressure - mild  Past Medical History:  Diagnosis Date  . Atrial fibrillation ([place])    a. diagnosed in 09/2018  . CAD (coronary artery disease)    a. s/p CABG in 10/2012 at [facility] with LIMA-LAD, SVG-OM, SVG-Diag via Y graft off OM  . Carcinoid  tumor   . CHF (congestive heart failure) ([place])    a. EF previously 40%, at 50% by imaging in 03/2018, 25% by echo 2020  . Colon cancer ([place]) 2008  . DM (diabetes mellitus) ([place])   . HTN (hypertension)   . MI (myocardial infarction) ([place]) FEB 2014   Acute MI of anterior wall  . Pulmonary edema   . Stroke [facility]) 2010   Residual R weakness   Past Surgical History:  Procedure Laterality Date  . BACTERIAL OVERGROWTH TEST N/A 03/18/2015   Procedure: BACTERIAL OVERGROWTH TEST;  Surgeon: Danie Binder, MD;  Location: [facility] END[facility];  Service: Endoscopy;  Laterality: N/A;  0800  . CARDIAC CATHETERIZATION  08/2008  . CARDIOVERSION N/A 12/03/2018   Procedure: CARDIOVERSION;  Surgeon: Satira Sark, MD;  Location: [facility] ENDO SUITE;  Service: Cardiovascular;  Laterality: N/A;  . CHOLECYSTECTOMY    . COLONOSCOPY N/A 03/03/2015   SLF: 1. One colon polyp removed 2. Mild diverticulosis in the sigmoid colon 3. Small internal hemorrhoids  . CORONARY ARTERY BYPASS GRAFT  10/08/2012  . SHOULDER SURGERY    . TUBAL LIGATION       Current Meds  Medication Sig  . Amino Acids-Protein Hydrolys (PRO-STAT PO) Take by mouth.  Marland Kitchen atorvastatin (LIPITOR) 20 MG tablet Take 1 tablet (20 mg total) by mouth daily at 6 PM.  . cholecalciferol (VITAMIN D) 1000 UNITS tablet Take 1,000 Units by mouth daily.  . colestipol (COLESTID) 1 g tablet TAKE 1 TABLET BY MOUTH 30 MINUTES BEFORE BREAKFAST AND SUPPER (Patient taking differently: Take 1 g by mouth daily. )  . ELIQUIS 5 MG TABS tablet Take 5 mg by mouth 2 (two) times daily.  . Fluticasone-Salmeterol (ADVAIR DISKUS) 250-50 MCG/DOSE AEPB Inhale 1 puff into the lungs 2 (two) times daily.  . insulin NPH-regular Human (70-30) 100 UNIT/ML injection Inject 12 Units into the skin 2 (two) times daily with a meal. (Patient taking differently: Inject 15 Units into the skin 2 (two) times daily with a meal. )  . Magnesium 400 MG CAPS Take 1 capsule by mouth daily.  . metolazone  (ZAROXOLYN) 2.5 MG tablet Take 2.5 mg by mouth daily.  . metoprolol succinate (TOPROL-XL) 50 MG 24 hr tablet Take 1 tablet (50 mg total) by mouth daily. Take with or immediately following a meal.  . Multiple Vitamin (MULTIVITAMIN) tablet Take 1 tablet by mouth daily.  . nitroGLYCERIN (NITROSTAT) 0.4 MG SL tablet Place 1 tablet (0.4 mg total) under the tongue every 5 (five) minutes as needed for chest pain.  Marland Kitchen omeprazole (PRILOSEC) 20 MG capsule TAKE 1 CAPSULE BY MOUTH EVERY DAY (Patient taking differently: Take 20 mg by mouth daily. )  . potassium chloride SA (K-DUR) 20 MEQ tablet Take 1 tablet (20 mEq total) by mouth daily.  Marland Kitchen tiZANidine (ZANAFLEX) 4 MG tablet Take 4 mg by mouth at bedtime.   . torsemide (DEMADEX) 20 MG tablet Take 2 tablets (  40 mg total) by mouth daily.  . travoprost, benzalkonium, (TRAVATAN) 0.004 % ophthalmic solution Place 1 drop into both eyes at bedtime.     Allergies:   Bee venom, Tradjenta [linagliptin], and Codeine   Social History   Tobacco Use  . Smoking status: Never Smoker  . Smokeless tobacco: Never Used  Substance Use Topics  . Alcohol use: No    Alcohol/week: 0.0 standard drinks  . Drug use: No     Family Hx: The patient's family history includes Colon cancer in her paternal uncle; Diabetes in her mother; Heart disease in her mother. There is no history of Colon polyps.  ROS:   Please see the history of present illness.    All other systems reviewed and are negative.   Labs/Other Tests and Data Reviewed:    Recent Labs: 01/01/2019: ALT 17; B Natriuretic Peptide 1,987.0 01/06/2019: Magnesium 2.1 01/07/2019: BUN 39; Creatinine, Ser 1.54; Hemoglobin 8.2; Platelets 145; Potassium 4.1; Sodium 140   Recent Lipid Panel No results found for: CHOL, TRIG, HDL, CHOLHDL, LDLCALC, LDLDIRECT  Wt Readings from Last 3 Encounters:  01/23/19 241 lb (109.3 kg)  01/07/19 249 lb (112.9 kg)  12/10/18 244 lb 6.4 oz (110.9 kg)     Objective:    Vital Signs:  Wt  241 lb (109.3 kg)   BMI 47.07 kg/m    68 y.o. female in no acute distress over the phone   ASSESSMENT & PLAN:    The facility has been contacted to send [place] notes and labs, MAR was sent  1.  Chronic systolic CHF: - Upon review of the records from [facility] and [facility], she seems to have been on torsemide 40 mg daily since being there, but it is discontinued although they did not discontinue the potassium 20 mEq daily.  She is also on magnesium. -She was on metolazone 2.5 mg daily as needed for edema, but has only gotten this 2 days, the 16th and 17 June. -We will clarify this with the facility. -According to the patient, she has had multiple blood draws, we will get the labs faxed over from the facility -Her symptoms and weight have improved, but she will need to be on diuretics long-term - Once we clarify her medications, and get updated labs from the facility, a final decision can be made on doses needed. - The patient will also make sure that we get discharge information from the facility  2.  CAD: -She is not having consistent chest pain with exertion. -She is not on aspirin because of the need for anticoagulation.  Continue beta-blocker and statin.  3.  Chronic atrial fibrillation: - She is rarely aware of her heart rate, only with exertion. -CHA2DS2-VASc = 8 (age x 1, female, CHF, CAD, HTN, DM, CVA x 2) -Continue Eliquis - She has a history of anemia, follow-up on labs to make sure her hemoglobin is stable  4.  CKD stage III - Once the labs from the facility are reviewed, a determination can be made on when her renal function needs to be checked again. - She says she will have a home health nurse after discharge, the home health RN can draw blood  COVID-19 Education: The signs and symptoms of COVID-19 were discussed with the patient and how to seek care for testing (follow up with PCP or arrange E-visit).  The importance of social distancing was discussed today.   Patient Risk:   After full review of this patient's clinical status, I  feel that they are at least moderate risk at this time.  Time:   Today, I have spent 22 minutes with the patient with telehealth technology discussing cardiac and other issues.     Medication Adjustments/Labs and Tests Ordered: Current medicines are reviewed at length with the patient today.  Concerns regarding medicines are outlined above.  Tests Ordered: No orders of the defined types were placed in this encounter.  Medication Changes: No orders of the defined types were placed in this encounter.   Disposition:  Follow up with Dr Bronson Ing in 6 weeks.  Jonetta Speak, PA-C  01/23/2019 1:44 PM    [facility]

## 2019-01-24 ENCOUNTER — Telehealth: Payer: Self-pay | Admitting: Physician Assistant

## 2019-01-24 DIAGNOSIS — R609 Edema, unspecified: Secondary | ICD-10-CM | POA: Diagnosis not present

## 2019-01-24 DIAGNOSIS — I4891 Unspecified atrial fibrillation: Secondary | ICD-10-CM | POA: Diagnosis not present

## 2019-01-24 DIAGNOSIS — I509 Heart failure, unspecified: Secondary | ICD-10-CM | POA: Diagnosis not present

## 2019-01-24 NOTE — Telephone Encounter (Signed)
   Received faxed labs and MAR from [facility].   These were reviewed and then I spoke with the nurse practitioner who works there.  Last week, [name] was diagnosed with pneumonia.  She received meropenem and completed the course of antibiotics.  She had been doing well from a cardiac standpoint, but after the antibiotics, she had significant volume overload.  The NP did not have the exact weights in front of her but stated she had lost 4 pounds this week by diuresing her.  She was first diuresed with metolazone 2.5 mg and then increased to 5 mg.  Yesterday, she was changed to Lasix 40 mg IM twice daily to try and pull fluid off so she could be discharged this coming Monday as previously planned.  Her potassium was 5.0, but with the increased metolazone and IM Lasix, it is felt that she should continue the potassium for now.  Labs are to be rechecked on Monday.  The plan is to pull fluid off over the weekend with the IM Lasix and then consider discharge on Monday if her volume status is felt to be at baseline.  The plan is to discharge her on torsemide 40 mg and K-Dur 20 mEq as she was previously on.  It was strongly suggested that we get her in for an office visit next week.  I agree, she is at extremely high risk for readmission.  Arrangements have been made for her to be added onto [place] Strader's schedule next Friday.  If she has not had a BMET next week, we will need to do that on Friday.  She is to get an early follow-up appointment with her PCP as well, to address the anemia and other issues.  So may have had labs there.  I will route this to [place] into [place] to make sure all parties are aware.  Rosaria Ferries, PA-C 01/24/2019 1:43 PM [facility] [phone]

## 2019-01-30 ENCOUNTER — Ambulatory Visit: Payer: Medicare HMO | Admitting: Student

## 2019-01-30 ENCOUNTER — Encounter: Payer: Self-pay | Admitting: Student

## 2019-01-30 ENCOUNTER — Other Ambulatory Visit: Payer: Self-pay

## 2019-01-30 VITALS — BP 110/60 | HR 88 | Temp 98.7°F | Wt 235.0 lb

## 2019-01-30 DIAGNOSIS — I2510 Atherosclerotic heart disease of native coronary artery without angina pectoris: Secondary | ICD-10-CM

## 2019-01-30 DIAGNOSIS — E785 Hyperlipidemia, unspecified: Secondary | ICD-10-CM | POA: Diagnosis not present

## 2019-01-30 DIAGNOSIS — N183 Chronic kidney disease, stage 3 unspecified: Secondary | ICD-10-CM

## 2019-01-30 DIAGNOSIS — I5042 Chronic combined systolic (congestive) and diastolic (congestive) heart failure: Secondary | ICD-10-CM

## 2019-01-30 DIAGNOSIS — I4819 Other persistent atrial fibrillation: Secondary | ICD-10-CM | POA: Diagnosis not present

## 2019-01-30 DIAGNOSIS — I10 Essential (primary) hypertension: Secondary | ICD-10-CM | POA: Diagnosis not present

## 2019-01-30 NOTE — Progress Notes (Signed)
Cardiology Office Note    Date:  01/30/2019   ID:  Erica [name], Erica [name] 1951-01-24, MRN [id number]  PCP:  Curlene Labrum, MD  Cardiologist: Kate Sable, MD    Chief Complaint  Patient presents with  . Follow-up    1 week visit    History of Present Illness:    Erica [name] is a 68 y.o. female with past medical history of CAD (s/p CABG in 10/2012), carotid artery stenosis, chronic combined systolic and diastolic CHF (EF previously 40%, 50% by repeat imaging in 03/2018, at 25% by echo in 09/2018),persistent atrial fibrillation (diagnosed in 09/2018, s/p DCCV in 11/2018 but did not maintain NSR --> rate-control strategy pursued),HTN, HLD, IDDM,Stage 3 CKD,and prior CVA who presents for a one-week follow-up visit.  She was most recently admitted to [facility] from 01/01/2019 to 01/07/2019 for an acute CHF exacerbation. Diuresed well with IV Lasix 80 mg twice daily during admission and weight declined to 248 lbs at the time of discharge. Was transitioned to Torsemide 40mg  daily at discharge with creatinine improved to 1.54. Amiodarone was discontinued as a rate control strategy was recommended for her atrial fibrillation and she was continued on Eliquis and Toprol-XL 50 mg daily.  She was discharged to SNF and had a follow-up telehealth visit with Rosaria Ferries, PA-C on 01/23/2019. Reported her weight was 241 lbs on the most recent check.  While at SNF, she had been requiring intermittent doses of IM Lasix along with Metolazone with plans to switch back to her PTA Torsemide dosing at discharge.  In talking with the patient today, she reports her breathing continues to improve. ON 3L [place] at baseline and is using her portable concentrator today. She denies any specific orthopnea, PND, or edema. Says her legs are the smallest they have been in several months. Weight has been stable at 231 - 232 lbs on her home scales (at 235 lbs on the office scales). Denies any recent chest pain. HR  has been well-controlled in the 60's to 80's when checked at home. Does experience occasional palpitations but says this typically occurs after utilizing her Albuterol inhaler.   Past Medical History:  Diagnosis Date  . Atrial fibrillation ([place])    a. diagnosed in 09/2018 --> s/p DCCV in 11/2018 but did not maintain NSR --> rate-control strategy pursued  . CAD (coronary artery disease)    a. s/p CABG in 10/2012 at [facility] with LIMA-LAD, SVG-OM, SVG-Diag via Y graft off OM  . Carcinoid tumor   . CHF (congestive heart failure) ([place])    a. EF previously 40%, at 50% by imaging in 03/2018, 25% by echo 2020  . Colon cancer ([place]) 2008  . DM (diabetes mellitus) ([place])   . HTN (hypertension)   . MI (myocardial infarction) ([place]) FEB 2014   Acute MI of anterior wall  . Pulmonary edema   . Stroke [facility]) 2010   Residual R weakness    Past Surgical History:  Procedure Laterality Date  . BACTERIAL OVERGROWTH TEST N/A 03/18/2015   Procedure: BACTERIAL OVERGROWTH TEST;  Surgeon: Danie Binder, MD;  Location: [facility] ENDO SUITE;  Service: Endoscopy;  Laterality: N/A;  0800  . CARDIAC CATHETERIZATION  08/2008  . CARDIOVERSION N/A 12/03/2018   Procedure: CARDIOVERSION;  Surgeon: Satira Sark, MD;  Location: [facility] ENDO SUITE;  Service: Cardiovascular;  Laterality: N/A;  . CHOLECYSTECTOMY    . COLONOSCOPY N/A 03/03/2015   SLF: 1. One colon polyp removed 2. Mild diverticulosis in  the sigmoid colon 3. Small internal hemorrhoids  . CORONARY ARTERY BYPASS GRAFT  10/08/2012  . SHOULDER SURGERY    . TUBAL LIGATION      Current Medications: Outpatient Medications Prior to Visit  Medication Sig Dispense Refill  . Amino Acids-Protein Hydrolys (PRO-STAT PO) Take by mouth.    Marland Kitchen atorvastatin (LIPITOR) 20 MG tablet Take 1 tablet (20 mg total) by mouth daily at 6 PM. 30 tablet 1  . cholecalciferol (VITAMIN D) 1000 UNITS tablet Take 1,000 Units by mouth daily.    . colestipol (COLESTID) 1 g tablet TAKE 1 TABLET BY MOUTH  30 MINUTES BEFORE BREAKFAST AND SUPPER (Patient taking differently: Take 1 g by mouth daily. ) 60 tablet 6  . ELIQUIS 5 MG TABS tablet Take 5 mg by mouth 2 (two) times daily.    Marland Kitchen amiodarone (PACERONE) 400 MG tablet Take 400 mg by mouth 2 (two) times daily.    . Fluticasone-Salmeterol (ADVAIR DISKUS) 250-50 MCG/DOSE AEPB Inhale 1 puff into the lungs 2 (two) times daily.    . insulin NPH-regular Human (70-30) 100 UNIT/ML injection Inject 12 Units into the skin 2 (two) times daily with a meal. (Patient taking differently: Inject 15 Units into the skin 2 (two) times daily with a meal. ) 10 mL 11  . Magnesium 400 MG CAPS Take 1 capsule by mouth daily. 30 capsule 6  . metoprolol succinate (TOPROL-XL) 50 MG 24 hr tablet Take 1 tablet (50 mg total) by mouth daily. Take with or immediately following a meal. 135 tablet 3  . Multiple Vitamin (MULTIVITAMIN) tablet Take 1 tablet by mouth daily.    . nitroGLYCERIN (NITROSTAT) 0.4 MG SL tablet Place 1 tablet (0.4 mg total) under the tongue every 5 (five) minutes as needed for chest pain. 25 tablet 3  . omeprazole (PRILOSEC) 20 MG capsule TAKE 1 CAPSULE BY MOUTH EVERY DAY (Patient taking differently: Take 20 mg by mouth daily. ) 90 capsule 0  . tiZANidine (ZANAFLEX) 4 MG tablet Take 4 mg by mouth at bedtime.     . torsemide (DEMADEX) 20 MG tablet Take 2 tablets (40 mg total) by mouth daily.    . travoprost, benzalkonium, (TRAVATAN) 0.004 % ophthalmic solution Place 1 drop into both eyes at bedtime.    . metolazone (ZAROXOLYN) 2.5 MG tablet Take 2.5 mg by mouth daily.     No facility-administered medications prior to visit.      Allergies:   Bee venom, Tradjenta [linagliptin], and Codeine   Social History   Socioeconomic History  . Marital status: Divorced    Spouse name: Not on file  . Number of children: Not on file  . Years of education: Not on file  . Highest education level: Not on file  Occupational History  . Not on file  Social Needs  .  Financial resource strain: Not on file  . Food insecurity    Worry: Not on file    Inability: Not on file  . Transportation needs    Medical: Not on file    Non-medical: Not on file  Tobacco Use  . Smoking status: Never Smoker  . Smokeless tobacco: Never Used  Substance and Sexual Activity  . Alcohol use: No    Alcohol/week: 0.0 standard drinks  . Drug use: No  . Sexual activity: Not on file  Lifestyle  . Physical activity    Days per week: Not on file    Minutes per session: Not on file  . Stress: Not  on file  Relationships  . Social Herbalist on phone: Not on file    Gets together: Not on file    Attends religious service: Not on file    Active member of club or organization: Not on file    Attends meetings of clubs or organizations: Not on file    Relationship status: Not on file  Other Topics Concern  . Not on file  Social History Narrative  . Not on file     Family History:  The patient's family history includes Colon cancer in her paternal uncle; Diabetes in her mother; Heart disease in her mother.   Review of Systems:   Please see the history of present illness.     General:  No chills, fever, night sweats or weight changes.  Cardiovascular:  No chest pain, edema, orthopnea,  paroxysmal nocturnal dyspnea. Positive for dyspnea on exertion and palpitations.  Dermatological: No rash, lesions/masses Respiratory: No cough, dyspnea Urologic: No hematuria, dysuria Abdominal:   No nausea, vomiting, diarrhea, bright red blood per rectum, melena, or hematemesis Neurologic:  No visual changes, wkns, changes in mental status. All other systems reviewed and are otherwise negative except as noted above.   Physical Exam:    VS:  BP 110/60   Pulse 88   Temp 98.7 F (37.1 C)   Wt 235 lb (106.6 kg)   SpO2 96% Comment: on 3 L [place]  BMI 45.90 kg/m    General: Well developed, overweight female appearing in no acute distress. Head: Normocephalic, atraumatic,  sclera non-icteric, no xanthomas, nares are without discharge.  Neck: No carotid bruits. JVD not elevated.  Lungs: Respirations regular and unlabored, without wheezes or rales. On 3L [place].  Heart: Irregularly irregular. No S3 or S4.  No murmur, no rubs, or gallops appreciated. Abdomen: Soft, non-tender, non-distended with normoactive bowel sounds. No hepatomegaly. No rebound/guarding. No obvious abdominal masses. Msk:  Strength and tone appear normal for age. No joint deformities or effusions. Extremities: No clubbing or cyanosis. Trace ankle edema. Right leg wrapped.  Distal pedal pulses are 2+ bilaterally. Neuro: Alert and oriented X 3. Moves all extremities spontaneously. No focal deficits noted. Psych:  Responds to questions appropriately with a normal affect. Skin: No rashes or lesions noted  Wt Readings from Last 3 Encounters:  01/30/19 235 lb (106.6 kg)  01/23/19 241 lb (109.3 kg)  01/07/19 249 lb (112.9 kg)     Studies/Labs Reviewed:   EKG:  EKG is not ordered today.    Recent Labs: 01/01/2019: ALT 17; B Natriuretic Peptide 1,987.0 01/06/2019: Magnesium 2.1 01/07/2019: BUN 39; Creatinine, Ser 1.54; Hemoglobin 8.2; Platelets 145; Potassium 4.1; Sodium 140   Lipid Panel No results found for: CHOL, TRIG, HDL, CHOLHDL, VLDL, LDLCALC, LDLDIRECT  Additional studies/ records that were reviewed today include:   Echocardiogram: 09/2018 Technically difficult study due to chest wall/lung interference  Severely decreased left ventricular systolic function, ejection fraction  25%  Segmental wall abnormalities (please see below)  Dilated right ventricle - mild  Mildly decreased right ventricular systolic function  Elevated pulmonary artery systolic pressure - mild  Assessment:    1. Chronic combined systolic (congestive) and diastolic (congestive) heart failure (HCC)   2. Persistent atrial fibrillation   3. Coronary artery disease involving native coronary artery of native heart  without angina pectoris   4. Essential hypertension   5. Hyperlipidemia LDL goal <70   6. CKD (chronic kidney disease) stage 3, GFR 30-59 ml/min (HCC)  Plan:   In order of problems listed above:  1. Chronic Combined Systolic and Diastolic CHF - She has experienced multiple admissions over the past several months for recurrent CHF exacerbations. EF was reduced to 25% by most recent echocardiogram in 09/2018. - Since her most recent admission, her weight was overall stable while at SNF and since returning home this has been stable at 231 - 232 lbs (at 235 lbs on office scales). Importance of continued monitoring of fluid intake and sodium consumption was reviewed. Will continue on Torsemide 20mg  BID. She is no longer on Metolazone and this was removed from her medication list. Will recheck BNP and BMET today.  - remains on Toprol-XL. Not on ACE-I/ARB/ARNI at this time due to her variable renal function.   2. Persistent Atrial Fibrillation - initially diagnosed in 09/2018 and she underwent DCCV in 11/2018 but did not maintain NSR and a rate-control strategy has been pursued since given her multiple co-morbidities. Amiodarone was therefore discontinued during her most recent admission. She remains on Toprol-XL 50 mg daily for rate control. - She denies any evidence of active bleeding. Continue Eliquis for anticoagulation.  3. CAD - s/p CABG in 2014. She denies any recent chest pain and her respiratory status has been at baseline.  - continue BB and statin therapy. No longer on ASA given the need for anticoagulation.   4. HTN - BP is well controlled at 110/60 during today's visit. Continue Toprol-XL 50 mg daily.  5. HLD - Followed by PCP. Goal LDL is less than 70 in the setting of known CAD. She remains on Atorvastatin 20 mg daily.  6. Stage 3 CKD - Creatinine peaked at 3.52 in 11/2018 and was elevated to 2.87 at the time of admission in 12/2018. This was stable at 1.54 upon discharge  on 01/07/2019. Her most recent labs from SNF were obtained on 01/16/2019 and showed her creatinine was 1.82. Will repeat BMET today. She has never been evaluated by Nephrology but has been referred to Dr. Lowanda Foster and is awaiting an appointment.    Medication Adjustments/Labs and Tests Ordered: Current medicines are reviewed at length with the patient today.  Concerns regarding medicines are outlined above.  Medication changes, Labs and Tests ordered today are listed in the Patient Instructions below. Patient Instructions  Medication Instructions: DO NOT take Amiodarone  DO NOT take Metolazone  Labwork: BMET BNP  Procedures/Testing: none  Follow-Up: 03/06/19  At 1:30 pm with B Shanna Strength PA-C  Any Additional Special Instructions Will Be Listed Below (If Applicable).   If you need a refill on your cardiac medications before your next appointment, please call your pharmacy.     Signed, Erma Heritage, PA-C  01/30/2019 12:58 PM    [facility] [address] [place], [place] [address] Phone: [phone] Fax: [phone]

## 2019-01-30 NOTE — Patient Instructions (Addendum)
Medication Instructions: DO NOT take Amiodarone  DO NOT take Metolazone  Labwork: Bmet,bnp   Procedures/Testing: none  Follow-Up: 03/06/19  At 1:30 pm with B Strader PA-C  Any Additional Special Instructions Will Be Listed Below (If Applicable).     If you need a refill on your cardiac medications before your next appointment, please call your pharmacy.

## 2019-01-31 ENCOUNTER — Ambulatory Visit: Payer: Medicare HMO | Admitting: Student

## 2019-01-31 ENCOUNTER — Telehealth: Payer: Self-pay

## 2019-01-31 DIAGNOSIS — Z79899 Other long term (current) drug therapy: Secondary | ICD-10-CM

## 2019-01-31 LAB — BASIC METABOLIC PANEL
BUN/Creatinine Ratio: 24 (calc) — ABNORMAL HIGH (ref 6–22)
BUN: 47 mg/dL — ABNORMAL HIGH (ref 7–25)
CO2: 33 mmol/L — ABNORMAL HIGH (ref 20–32)
Calcium: 9 mg/dL (ref 8.6–10.4)
Chloride: 97 mmol/L — ABNORMAL LOW (ref 98–110)
Creat: 2 mg/dL — ABNORMAL HIGH (ref 0.50–0.99)
Glucose, Bld: 89 mg/dL (ref 65–139)
Potassium: 3.3 mmol/L — ABNORMAL LOW (ref 3.5–5.3)
Sodium: 141 mmol/L (ref 135–146)

## 2019-01-31 LAB — BRAIN NATRIURETIC PEPTIDE: Brain Natriuretic Peptide: 1845 pg/mL — ABNORMAL HIGH (ref <100)

## 2019-01-31 MED ORDER — TORSEMIDE 20 MG PO TABS: ORAL_TABLET | ORAL | 3 refills | Status: DC

## 2019-01-31 NOTE — Telephone Encounter (Signed)
-----   Message from Erma Heritage, [place] sent at 01/31/2019 12:53 PM EDT ----- Please let the patient know that unfortunately her creatinine has started to trend upwards again. Was 1.54 at hospital discharge, 1.82 while at rehab and now at 2.00. Fluid level elevated and consistent with prior readings. Reviewed with Dr. Bronson Ing and will reduce Torsemide from 20mg  BID to 20mg  daily. K+ was slightly low but this should normalize with dose reduction of Torsemide. She should continue to weigh herself daily and take an extra Torsemide tablet for weight gain greater than 2 lbs overnight or 5 lbs in one week. Repeat BMET in 2 weeks if not obtained by PCP or Nephrology in the interim. She is on the wait-list to see Dr. Lowanda Foster. Please see if this can be updated to an urgent status given her variable renal function and repeat hospitalizations. Please forward a copy of labs to [place], Virgina Evener, MD.

## 2019-01-31 NOTE — Telephone Encounter (Signed)
Discussed med changes with patient,she verbalized understanding of instructions, mailed bmet lab slip

## 2019-02-11 ENCOUNTER — Ambulatory Visit: Payer: Medicare HMO | Admitting: Student

## 2019-02-15 ENCOUNTER — Telehealth: Payer: Self-pay | Admitting: Physician Assistant

## 2019-02-15 LAB — BASIC METABOLIC PANEL
BUN/Creatinine Ratio: 17 (calc) (ref 6–22)
BUN: 36 mg/dL — ABNORMAL HIGH (ref 7–25)
CO2: 26 mmol/L (ref 20–32)
Calcium: 9.1 mg/dL (ref 8.6–10.4)
Chloride: 103 mmol/L (ref 98–110)
Creat: 2.08 mg/dL — ABNORMAL HIGH (ref 0.50–0.99)
Glucose, Bld: 28 mg/dL — CL (ref 65–139)
Potassium: 4.5 mmol/L (ref 3.5–5.3)
Sodium: 141 mmol/L (ref 135–146)

## 2019-02-15 NOTE — Telephone Encounter (Addendum)
Called by [facility] for critical glucose. BMET    Component Value Date/Time   NA 141 02/14/2019 1118   K 4.5 02/14/2019 1118   CL 103 02/14/2019 1118   CO2 26 02/14/2019 1118   GLUCOSE 28 (LL) 02/14/2019 1118   BUN 36 (H) 02/14/2019 1118   CREATININE 2.08 (H) 02/14/2019 1118   CALCIUM 9.1 02/14/2019 1118   GFRNONAA 35 (L) 01/07/2019 1050   GFRAA 40 (L) 01/07/2019 1050    I left a message for the patient to return my call. Richardson Dopp, PA-C    02/15/2019 10:13 AM    11:43 AM >> Patient notes her sugar today is 115.  She feels fine.  No further recommendations. Richardson Dopp, PA-C    02/15/2019 11:43 AM

## 2019-02-23 ENCOUNTER — Inpatient Hospital Stay (HOSPITAL_COMMUNITY)
Admission: EM | Admit: 2019-02-23 | Discharge: 2019-02-27 | DRG: 291 | Disposition: A | Discharge status: Home Health | Payer: Medicare HMO | Attending: Internal Medicine | Admitting: Internal Medicine

## 2019-02-23 DIAGNOSIS — Z6841 Body Mass Index (BMI) 40.0 and over, adult: Secondary | ICD-10-CM

## 2019-02-23 DIAGNOSIS — D631 Anemia in chronic kidney disease: Secondary | ICD-10-CM | POA: Diagnosis present

## 2019-02-23 DIAGNOSIS — K219 Gastro-esophageal reflux disease without esophagitis: Secondary | ICD-10-CM | POA: Diagnosis present

## 2019-02-23 DIAGNOSIS — I4821 Permanent atrial fibrillation: Secondary | ICD-10-CM | POA: Diagnosis present

## 2019-02-23 DIAGNOSIS — I5023 Acute on chronic systolic (congestive) heart failure: Secondary | ICD-10-CM | POA: Diagnosis present

## 2019-02-23 DIAGNOSIS — Z8249 Family history of ischemic heart disease and other diseases of the circulatory system: Secondary | ICD-10-CM

## 2019-02-23 DIAGNOSIS — I509 Heart failure, unspecified: Secondary | ICD-10-CM

## 2019-02-23 DIAGNOSIS — Z800 Family history of malignant neoplasm of digestive organs: Secondary | ICD-10-CM

## 2019-02-23 DIAGNOSIS — R0602 Shortness of breath: Secondary | ICD-10-CM

## 2019-02-23 DIAGNOSIS — Z888 Allergy status to other drugs, medicaments and biological substances status: Secondary | ICD-10-CM

## 2019-02-23 DIAGNOSIS — N183 Chronic kidney disease, stage 3 unspecified: Secondary | ICD-10-CM

## 2019-02-23 DIAGNOSIS — I5043 Acute on chronic combined systolic (congestive) and diastolic (congestive) heart failure: Secondary | ICD-10-CM | POA: Diagnosis not present

## 2019-02-23 DIAGNOSIS — J9621 Acute and chronic respiratory failure with hypoxia: Secondary | ICD-10-CM | POA: Diagnosis present

## 2019-02-23 DIAGNOSIS — Z885 Allergy status to narcotic agent status: Secondary | ICD-10-CM

## 2019-02-23 DIAGNOSIS — Z79899 Other long term (current) drug therapy: Secondary | ICD-10-CM

## 2019-02-23 DIAGNOSIS — I252 Old myocardial infarction: Secondary | ICD-10-CM

## 2019-02-23 DIAGNOSIS — E1122 Type 2 diabetes mellitus with diabetic chronic kidney disease: Secondary | ICD-10-CM | POA: Diagnosis present

## 2019-02-23 DIAGNOSIS — Z794 Long term (current) use of insulin: Secondary | ICD-10-CM

## 2019-02-23 DIAGNOSIS — Z951 Presence of aortocoronary bypass graft: Secondary | ICD-10-CM

## 2019-02-23 DIAGNOSIS — E785 Hyperlipidemia, unspecified: Secondary | ICD-10-CM | POA: Diagnosis present

## 2019-02-23 DIAGNOSIS — L03115 Cellulitis of right lower limb: Secondary | ICD-10-CM | POA: Diagnosis present

## 2019-02-23 DIAGNOSIS — Z85038 Personal history of other malignant neoplasm of large intestine: Secondary | ICD-10-CM

## 2019-02-23 DIAGNOSIS — I69351 Hemiplegia and hemiparesis following cerebral infarction affecting right dominant side: Secondary | ICD-10-CM

## 2019-02-23 DIAGNOSIS — Z1159 Encounter for screening for other viral diseases: Secondary | ICD-10-CM

## 2019-02-23 DIAGNOSIS — R002 Palpitations: Secondary | ICD-10-CM | POA: Diagnosis not present

## 2019-02-23 DIAGNOSIS — E1121 Type 2 diabetes mellitus with diabetic nephropathy: Secondary | ICD-10-CM | POA: Diagnosis present

## 2019-02-23 DIAGNOSIS — Z7901 Long term (current) use of anticoagulants: Secondary | ICD-10-CM

## 2019-02-23 DIAGNOSIS — M7989 Other specified soft tissue disorders: Secondary | ICD-10-CM | POA: Diagnosis present

## 2019-02-23 DIAGNOSIS — Z833 Family history of diabetes mellitus: Secondary | ICD-10-CM

## 2019-02-23 DIAGNOSIS — I130 Hypertensive heart and chronic kidney disease with heart failure and stage 1 through stage 4 chronic kidney disease, or unspecified chronic kidney disease: Principal | ICD-10-CM | POA: Diagnosis present

## 2019-02-23 DIAGNOSIS — N179 Acute kidney failure, unspecified: Secondary | ICD-10-CM | POA: Diagnosis present

## 2019-02-23 DIAGNOSIS — E11649 Type 2 diabetes mellitus with hypoglycemia without coma: Secondary | ICD-10-CM | POA: Diagnosis present

## 2019-02-23 DIAGNOSIS — I10 Essential (primary) hypertension: Secondary | ICD-10-CM | POA: Diagnosis present

## 2019-02-23 DIAGNOSIS — Z91030 Bee allergy status: Secondary | ICD-10-CM

## 2019-02-23 DIAGNOSIS — I2510 Atherosclerotic heart disease of native coronary artery without angina pectoris: Secondary | ICD-10-CM | POA: Diagnosis present

## 2019-02-23 DIAGNOSIS — I4819 Other persistent atrial fibrillation: Secondary | ICD-10-CM | POA: Diagnosis present

## 2019-02-24 ENCOUNTER — Encounter (HOSPITAL_COMMUNITY): Payer: Self-pay | Admitting: Emergency Medicine

## 2019-02-24 ENCOUNTER — Emergency Department (HOSPITAL_COMMUNITY): Payer: Medicare HMO

## 2019-02-24 ENCOUNTER — Other Ambulatory Visit: Payer: Self-pay

## 2019-02-24 ENCOUNTER — Observation Stay (HOSPITAL_BASED_OUTPATIENT_CLINIC_OR_DEPARTMENT_OTHER): Payer: Medicare HMO

## 2019-02-24 DIAGNOSIS — I252 Old myocardial infarction: Secondary | ICD-10-CM | POA: Diagnosis not present

## 2019-02-24 DIAGNOSIS — N183 Chronic kidney disease, stage 3 unspecified: Secondary | ICD-10-CM

## 2019-02-24 DIAGNOSIS — E11649 Type 2 diabetes mellitus with hypoglycemia without coma: Secondary | ICD-10-CM | POA: Diagnosis present

## 2019-02-24 DIAGNOSIS — Z800 Family history of malignant neoplasm of digestive organs: Secondary | ICD-10-CM | POA: Diagnosis not present

## 2019-02-24 DIAGNOSIS — I10 Essential (primary) hypertension: Secondary | ICD-10-CM | POA: Diagnosis not present

## 2019-02-24 DIAGNOSIS — M7989 Other specified soft tissue disorders: Secondary | ICD-10-CM | POA: Diagnosis present

## 2019-02-24 DIAGNOSIS — J9621 Acute and chronic respiratory failure with hypoxia: Secondary | ICD-10-CM | POA: Diagnosis present

## 2019-02-24 DIAGNOSIS — L03115 Cellulitis of right lower limb: Secondary | ICD-10-CM | POA: Diagnosis present

## 2019-02-24 DIAGNOSIS — I5043 Acute on chronic combined systolic (congestive) and diastolic (congestive) heart failure: Secondary | ICD-10-CM | POA: Diagnosis present

## 2019-02-24 DIAGNOSIS — I4821 Permanent atrial fibrillation: Secondary | ICD-10-CM | POA: Diagnosis present

## 2019-02-24 DIAGNOSIS — Z79899 Other long term (current) drug therapy: Secondary | ICD-10-CM | POA: Diagnosis not present

## 2019-02-24 DIAGNOSIS — N179 Acute kidney failure, unspecified: Secondary | ICD-10-CM | POA: Diagnosis not present

## 2019-02-24 DIAGNOSIS — R002 Palpitations: Secondary | ICD-10-CM | POA: Diagnosis not present

## 2019-02-24 DIAGNOSIS — E1122 Type 2 diabetes mellitus with diabetic chronic kidney disease: Secondary | ICD-10-CM | POA: Diagnosis present

## 2019-02-24 DIAGNOSIS — E785 Hyperlipidemia, unspecified: Secondary | ICD-10-CM

## 2019-02-24 DIAGNOSIS — I2510 Atherosclerotic heart disease of native coronary artery without angina pectoris: Secondary | ICD-10-CM | POA: Diagnosis present

## 2019-02-24 DIAGNOSIS — D631 Anemia in chronic kidney disease: Secondary | ICD-10-CM | POA: Diagnosis present

## 2019-02-24 DIAGNOSIS — Z1159 Encounter for screening for other viral diseases: Secondary | ICD-10-CM | POA: Diagnosis not present

## 2019-02-24 DIAGNOSIS — I340 Nonrheumatic mitral (valve) insufficiency: Secondary | ICD-10-CM | POA: Diagnosis not present

## 2019-02-24 DIAGNOSIS — I130 Hypertensive heart and chronic kidney disease with heart failure and stage 1 through stage 4 chronic kidney disease, or unspecified chronic kidney disease: Secondary | ICD-10-CM | POA: Diagnosis present

## 2019-02-24 DIAGNOSIS — Z6841 Body Mass Index (BMI) 40.0 and over, adult: Secondary | ICD-10-CM | POA: Diagnosis not present

## 2019-02-24 DIAGNOSIS — I5023 Acute on chronic systolic (congestive) heart failure: Secondary | ICD-10-CM | POA: Diagnosis not present

## 2019-02-24 DIAGNOSIS — I361 Nonrheumatic tricuspid (valve) insufficiency: Secondary | ICD-10-CM | POA: Diagnosis not present

## 2019-02-24 DIAGNOSIS — Z833 Family history of diabetes mellitus: Secondary | ICD-10-CM | POA: Diagnosis not present

## 2019-02-24 DIAGNOSIS — Z951 Presence of aortocoronary bypass graft: Secondary | ICD-10-CM | POA: Diagnosis not present

## 2019-02-24 DIAGNOSIS — K219 Gastro-esophageal reflux disease without esophagitis: Secondary | ICD-10-CM | POA: Diagnosis present

## 2019-02-24 DIAGNOSIS — I69351 Hemiplegia and hemiparesis following cerebral infarction affecting right dominant side: Secondary | ICD-10-CM | POA: Diagnosis not present

## 2019-02-24 DIAGNOSIS — I509 Heart failure, unspecified: Secondary | ICD-10-CM

## 2019-02-24 DIAGNOSIS — Z85038 Personal history of other malignant neoplasm of large intestine: Secondary | ICD-10-CM | POA: Diagnosis not present

## 2019-02-24 LAB — CBC WITH DIFFERENTIAL/PLATELET
Abs Immature Granulocytes: 0.04 10*3/uL (ref 0.00–0.07)
Basophils Absolute: 0 10*3/uL (ref 0.0–0.1)
Basophils Relative: 0 %
Eosinophils Absolute: 0 10*3/uL (ref 0.0–0.5)
Eosinophils Relative: 0 %
HCT: 30.6 % — ABNORMAL LOW (ref 36.0–46.0)
Hemoglobin: 8.6 g/dL — ABNORMAL LOW (ref 12.0–15.0)
Immature Granulocytes: 0 %
Lymphocytes Relative: 9 %
Lymphs Abs: 1.3 10*3/uL (ref 0.7–4.0)
MCH: 22.2 pg — ABNORMAL LOW (ref 26.0–34.0)
MCHC: 28.1 g/dL — ABNORMAL LOW (ref 30.0–36.0)
MCV: 78.9 fL — ABNORMAL LOW (ref 80.0–100.0)
Monocytes Absolute: 1.1 10*3/uL — ABNORMAL HIGH (ref 0.1–1.0)
Monocytes Relative: 8 %
Neutro Abs: 11.2 10*3/uL — ABNORMAL HIGH (ref 1.7–7.7)
Neutrophils Relative %: 83 %
Platelets: 183 10*3/uL (ref 150–400)
RBC: 3.88 MIL/uL (ref 3.87–5.11)
RDW: 18.8 % — ABNORMAL HIGH (ref 11.5–15.5)
WBC: 13.5 10*3/uL — ABNORMAL HIGH (ref 4.0–10.5)
nRBC: 0 % (ref 0.0–0.2)

## 2019-02-24 LAB — BASIC METABOLIC PANEL
Anion gap: 8 (ref 5–15)
BUN: 46 mg/dL — ABNORMAL HIGH (ref 8–23)
CO2: 25 mmol/L (ref 22–32)
Calcium: 8.6 mg/dL — ABNORMAL LOW (ref 8.9–10.3)
Chloride: 104 mmol/L (ref 98–111)
Creatinine, Ser: 2.28 mg/dL — ABNORMAL HIGH (ref 0.44–1.00)
GFR calc Af Amer: 25 mL/min — ABNORMAL LOW (ref >60)
GFR calc non Af Amer: 22 mL/min — ABNORMAL LOW (ref >60)
Glucose, Bld: 85 mg/dL (ref 70–99)
Potassium: 4.9 mmol/L (ref 3.5–5.1)
Sodium: 137 mmol/L (ref 135–145)

## 2019-02-24 LAB — GLUCOSE, CAPILLARY
Glucose-Capillary: 82 mg/dL (ref 70–99)
Glucose-Capillary: 96 mg/dL (ref 70–99)
Glucose-Capillary: 123 mg/dL — ABNORMAL HIGH (ref 70–99)
Glucose-Capillary: 136 mg/dL — ABNORMAL HIGH (ref 70–99)

## 2019-02-24 LAB — BLOOD GAS, ARTERIAL
Acid-Base Excess: 1.4 mmol/L (ref 0.0–2.0)
Bicarbonate: 24.6 mmol/L (ref 20.0–28.0)
FIO2: 36
O2 Saturation: 33.5 %
Patient temperature: 37
pCO2 arterial: 44.7 mmHg (ref 32.0–48.0)
pH, Arterial: 7.382 (ref 7.350–7.450)
pO2, Arterial: <31 mmHg — CL (ref 83.0–108.0)

## 2019-02-24 LAB — CBC
HCT: 26.4 % — ABNORMAL LOW (ref 36.0–46.0)
Hemoglobin: 7.5 g/dL — ABNORMAL LOW (ref 12.0–15.0)
MCH: 22.2 pg — ABNORMAL LOW (ref 26.0–34.0)
MCHC: 28.4 g/dL — ABNORMAL LOW (ref 30.0–36.0)
MCV: 78.1 fL — ABNORMAL LOW (ref 80.0–100.0)
Platelets: 154 10*3/uL (ref 150–400)
RBC: 3.38 MIL/uL — ABNORMAL LOW (ref 3.87–5.11)
RDW: 18.8 % — ABNORMAL HIGH (ref 11.5–15.5)
WBC: 12.5 10*3/uL — ABNORMAL HIGH (ref 4.0–10.5)
nRBC: 0 % (ref 0.0–0.2)

## 2019-02-24 LAB — BRAIN NATRIURETIC PEPTIDE: B Natriuretic Peptide: 3468 pg/mL — ABNORMAL HIGH (ref 0.0–100.0)

## 2019-02-24 LAB — ECHOCARDIOGRAM COMPLETE
Height: 60 in
Weight: 3700.2 oz

## 2019-02-24 LAB — COMPREHENSIVE METABOLIC PANEL
ALT: 15 U/L (ref 0–44)
AST: 26 U/L (ref 15–41)
Albumin: 3.2 g/dL — ABNORMAL LOW (ref 3.5–5.0)
Alkaline Phosphatase: 110 U/L (ref 38–126)
Anion gap: 11 (ref 5–15)
BUN: 44 mg/dL — ABNORMAL HIGH (ref 8–23)
CO2: 21 mmol/L — ABNORMAL LOW (ref 22–32)
Calcium: 8.8 mg/dL — ABNORMAL LOW (ref 8.9–10.3)
Chloride: 105 mmol/L (ref 98–111)
Creatinine, Ser: 2.25 mg/dL — ABNORMAL HIGH (ref 0.44–1.00)
GFR calc Af Amer: 26 mL/min — ABNORMAL LOW (ref >60)
GFR calc non Af Amer: 22 mL/min — ABNORMAL LOW (ref >60)
Glucose, Bld: 75 mg/dL (ref 70–99)
Potassium: 5.1 mmol/L (ref 3.5–5.1)
Sodium: 137 mmol/L (ref 135–145)
Total Bilirubin: 1.9 mg/dL — ABNORMAL HIGH (ref 0.3–1.2)
Total Protein: 6.8 g/dL (ref 6.5–8.1)

## 2019-02-24 LAB — TROPONIN I (HIGH SENSITIVITY)
Troponin I (High Sensitivity): 23 ng/L — ABNORMAL HIGH (ref <18)
Troponin I (High Sensitivity): 23 ng/L — ABNORMAL HIGH (ref <18)

## 2019-02-24 LAB — SARS CORONAVIRUS 2 BY RT PCR (HOSPITAL ORDER, PERFORMED IN ~~LOC~~ HOSPITAL LAB): SARS Coronavirus 2: NEGATIVE

## 2019-02-24 LAB — MRSA PCR SCREENING: MRSA by PCR: NEGATIVE

## 2019-02-24 LAB — LACTIC ACID, PLASMA: Lactic Acid, Venous: 1.2 mmol/L (ref 0.5–1.9)

## 2019-02-24 MED ORDER — TRAZODONE HCL 50 MG PO TABS
50.0000 mg | ORAL_TABLET | Freq: Every evening | ORAL | Status: DC | PRN
  Administered 2019-02-25: 50 mg via ORAL
  Filled 2019-02-24: qty 1

## 2019-02-24 MED ORDER — FUROSEMIDE 10 MG/ML IJ SOLN
40.0000 mg | Freq: Once | INTRAMUSCULAR | Status: AC
  Administered 2019-02-24: 40 mg via INTRAVENOUS
  Filled 2019-02-24: qty 4

## 2019-02-24 MED ORDER — VITAMIN D 1000 UNITS PO TABS: 1000.0000 [IU] | ORAL_TABLET | Freq: Every day | ORAL | Status: DC

## 2019-02-24 MED ORDER — NITROGLYCERIN 0.4 MG SL SUBL: 0.4000 mg | SUBLINGUAL_TABLET | SUBLINGUAL | Status: DC | PRN

## 2019-02-24 MED ORDER — ALBUTEROL SULFATE (2.5 MG/3ML) 0.083% IN NEBU: 2.5000 mg | INHALATION_SOLUTION | RESPIRATORY_TRACT | Status: DC | PRN

## 2019-02-24 MED ORDER — MAGNESIUM 200 MG PO TABS
ORAL_TABLET | Freq: Every day | ORAL | Status: DC
  Filled 2019-02-24: qty 1
  Filled 2019-02-24 (×3): qty 2

## 2019-02-24 MED ORDER — METOPROLOL TARTRATE 5 MG/5ML IV SOLN
2.5000 mg | Freq: Once | INTRAVENOUS | Status: AC
  Administered 2019-02-24: 2.5 mg via INTRAVENOUS
  Filled 2019-02-24: qty 5

## 2019-02-24 MED ORDER — FUROSEMIDE 10 MG/ML IJ SOLN
40.0000 mg | Freq: Two times a day (BID) | INTRAMUSCULAR | Status: DC
  Administered 2019-02-24: 40 mg via INTRAVENOUS
  Filled 2019-02-24: qty 4

## 2019-02-24 MED ORDER — CEFAZOLIN SODIUM-DEXTROSE 1-4 GM/50ML-% IV SOLN: 1.0000 g | Freq: Three times a day (TID) | INTRAVENOUS | Status: DC

## 2019-02-24 MED ORDER — COLESTIPOL HCL 1 G PO TABS
1.0000 g | ORAL_TABLET | Freq: Every day | ORAL | Status: DC
  Administered 2019-02-24 – 2019-02-27 (×4): 1 g via ORAL
  Filled 2019-02-24 (×5): qty 1

## 2019-02-24 MED ORDER — LATANOPROST 0.005 % OP SOLN
1.0000 [drp] | Freq: Every day | OPHTHALMIC | Status: DC
  Administered 2019-02-24 – 2019-02-26 (×3): 1 [drp] via OPHTHALMIC
  Filled 2019-02-24: qty 2.5

## 2019-02-24 MED ORDER — SODIUM CHLORIDE 0.9 % IV BOLUS
250.0000 mL | Freq: Once | INTRAVENOUS | Status: AC
  Administered 2019-02-24: 250 mL via INTRAVENOUS

## 2019-02-24 MED ORDER — CEFAZOLIN SODIUM-DEXTROSE 1-4 GM/50ML-% IV SOLN
1.0000 g | Freq: Two times a day (BID) | INTRAVENOUS | Status: DC
  Administered 2019-02-24 – 2019-02-27 (×7): 1 g via INTRAVENOUS
  Filled 2019-02-24 (×13): qty 50

## 2019-02-24 MED ORDER — ADULT MULTIVITAMIN W/MINERALS CH
1.0000 | ORAL_TABLET | Freq: Every day | ORAL | Status: DC
  Administered 2019-02-24 – 2019-02-27 (×4): 1 via ORAL
  Filled 2019-02-24 (×9): qty 1

## 2019-02-24 MED ORDER — INSULIN ASPART 100 UNIT/ML ~~LOC~~ SOLN
0.0000 [IU] | Freq: Every day | SUBCUTANEOUS | Status: DC
  Administered 2019-02-25: 2 [IU] via SUBCUTANEOUS

## 2019-02-24 MED ORDER — FUROSEMIDE 10 MG/ML IJ SOLN
20.0000 mg | Freq: Two times a day (BID) | INTRAMUSCULAR | Status: DC
  Administered 2019-02-24: 20 mg via INTRAVENOUS
  Filled 2019-02-24: qty 2

## 2019-02-24 MED ORDER — ACETAMINOPHEN 325 MG PO TABS: 650.0000 mg | ORAL_TABLET | Freq: Four times a day (QID) | ORAL | Status: DC | PRN

## 2019-02-24 MED ORDER — ACETAMINOPHEN 650 MG RE SUPP: 650.0000 mg | Freq: Four times a day (QID) | RECTAL | Status: DC | PRN

## 2019-02-24 MED ORDER — TRAVOPROST (BAK FREE) 0.004 % OP SOLN
1.0000 [drp] | Freq: Every day | OPHTHALMIC | Status: DC
  Filled 2019-02-24: qty 2.5

## 2019-02-24 MED ORDER — PANTOPRAZOLE SODIUM 40 MG PO TBEC
40.0000 mg | DELAYED_RELEASE_TABLET | Freq: Every day | ORAL | Status: DC
  Administered 2019-02-25: 40 mg via ORAL
  Filled 2019-02-24 (×2): qty 1

## 2019-02-24 MED ORDER — MOMETASONE FURO-FORMOTEROL FUM 200-5 MCG/ACT IN AERO
2.0000 | INHALATION_SPRAY | Freq: Two times a day (BID) | RESPIRATORY_TRACT | Status: DC
  Administered 2019-02-24 – 2019-02-27 (×5): 2 via RESPIRATORY_TRACT
  Filled 2019-02-24: qty 8.8

## 2019-02-24 MED ORDER — POLYETHYLENE GLYCOL 3350 17 G PO PACK: 17.0000 g | PACK | Freq: Every day | ORAL | Status: DC | PRN

## 2019-02-24 MED ORDER — ASPIRIN 81 MG PO CHEW
324.0000 mg | CHEWABLE_TABLET | Freq: Once | ORAL | Status: AC
  Administered 2019-02-24: 324 mg via ORAL
  Filled 2019-02-24: qty 4

## 2019-02-24 MED ORDER — TIZANIDINE HCL 4 MG PO TABS
4.0000 mg | ORAL_TABLET | Freq: Every day | ORAL | Status: DC
  Administered 2019-02-24 – 2019-02-26 (×3): 4 mg via ORAL
  Filled 2019-02-24 (×2): qty 2
  Filled 2019-02-24 (×5): qty 1

## 2019-02-24 MED ORDER — PERFLUTREN LIPID MICROSPHERE
1.0000 mL | INTRAVENOUS | Status: AC | PRN
  Administered 2019-02-24: 2 mL via INTRAVENOUS
  Administered 2019-02-24: 1 mL via INTRAVENOUS
  Filled 2019-02-24: qty 10

## 2019-02-24 MED ORDER — METOPROLOL SUCCINATE ER 50 MG PO TB24
50.0000 mg | ORAL_TABLET | Freq: Every day | ORAL | Status: DC
  Administered 2019-02-24 – 2019-02-27 (×4): 50 mg via ORAL
  Filled 2019-02-24 (×3): qty 2
  Filled 2019-02-24: qty 1

## 2019-02-24 MED ORDER — MAGNESIUM OXIDE 400 (241.3 MG) MG PO TABS
200.0000 mg | ORAL_TABLET | Freq: Every day | ORAL | Status: DC
  Administered 2019-02-24 – 2019-02-27 (×4): 200 mg via ORAL
  Filled 2019-02-24 (×3): qty 1

## 2019-02-24 MED ORDER — INSULIN ASPART 100 UNIT/ML ~~LOC~~ SOLN
0.0000 [IU] | Freq: Three times a day (TID) | SUBCUTANEOUS | Status: DC
  Administered 2019-02-24: 2 [IU] via SUBCUTANEOUS
  Administered 2019-02-25: 5 [IU] via SUBCUTANEOUS
  Administered 2019-02-25 – 2019-02-26 (×3): 3 [IU] via SUBCUTANEOUS
  Administered 2019-02-26 – 2019-02-27 (×4): 5 [IU] via SUBCUTANEOUS
  Administered 2019-02-27: 3 [IU] via SUBCUTANEOUS

## 2019-02-24 MED ORDER — ONDANSETRON HCL 4 MG PO TABS: 4.0000 mg | ORAL_TABLET | Freq: Four times a day (QID) | ORAL | Status: DC | PRN

## 2019-02-24 MED ORDER — SODIUM CHLORIDE 0.9 % IV SOLN: 250.0000 mL | INTRAVENOUS | Status: DC | PRN

## 2019-02-24 MED ORDER — SODIUM CHLORIDE 0.9 % IV SOLN: 1.0000 g | INTRAVENOUS | Status: DC

## 2019-02-24 MED ORDER — APIXABAN 5 MG PO TABS
5.0000 mg | ORAL_TABLET | Freq: Two times a day (BID) | ORAL | Status: DC
  Administered 2019-02-24 – 2019-02-27 (×7): 5 mg via ORAL
  Filled 2019-02-24 (×8): qty 1

## 2019-02-24 MED ORDER — ONDANSETRON HCL 4 MG/2ML IJ SOLN
4.0000 mg | Freq: Four times a day (QID) | INTRAMUSCULAR | Status: DC | PRN
  Administered 2019-02-25: 4 mg via INTRAVENOUS
  Filled 2019-02-24: qty 2

## 2019-02-24 MED ORDER — SODIUM CHLORIDE 0.9 % IV SOLN
1.0000 g | Freq: Once | INTRAVENOUS | Status: AC
  Administered 2019-02-24: 1 g via INTRAVENOUS
  Filled 2019-02-24: qty 10

## 2019-02-24 MED ORDER — ATORVASTATIN CALCIUM 20 MG PO TABS
20.0000 mg | ORAL_TABLET | Freq: Every day | ORAL | Status: DC
  Administered 2019-02-24 – 2019-02-26 (×3): 20 mg via ORAL
  Filled 2019-02-24 (×3): qty 2

## 2019-02-24 MED ORDER — SODIUM CHLORIDE 0.9% FLUSH
3.0000 mL | Freq: Two times a day (BID) | INTRAVENOUS | Status: DC
  Administered 2019-02-24 – 2019-02-27 (×6): 3 mL via INTRAVENOUS

## 2019-02-24 MED ORDER — SODIUM CHLORIDE 0.9% FLUSH: 3.0000 mL | INTRAVENOUS | Status: DC | PRN

## 2019-02-24 MED ORDER — HEPARIN SODIUM (PORCINE) 5000 UNIT/ML IJ SOLN: 5000.0000 [IU] | Freq: Three times a day (TID) | INTRAMUSCULAR | Status: DC

## 2019-02-24 MED ORDER — INSULIN ASPART PROT & ASPART (70-30 MIX) 100 UNIT/ML ~~LOC~~ SUSP
15.0000 [IU] | Freq: Two times a day (BID) | SUBCUTANEOUS | Status: DC
  Filled 2019-02-24: qty 10

## 2019-02-24 NOTE — Progress Notes (Signed)
Noted continued elevated heart running Afib 110 to 130's. Upon review of prior vital signs noted marked over the last 12 hours. Notified Midlevel orders received and implemented.

## 2019-02-24 NOTE — Consult Note (Addendum)
Cardiology Consultation:   Patient ID: BENTLEIGH WAREN MRN: [id number]; DOB: Jun 20, 1951  Admit date: 02/23/2019 Date of Consult: 02/24/2019  Primary Care Provider: Curlene Labrum, MD Primary Cardiologist: Kate Sable, MD  Primary Electrophysiologist:  None    Patient Profile:   Erica [name] is a 68 y.o. female with a hx of CAD and chronic combined CHF who is being seen today for the evaluation of acute CHF at the request of Tat.  History of Present Illness:   Erica [name] is a 67 yr old female patient with history of CAD S/P CABG 2014, chronic combined systolic and diastolic CHF, most recent EF 25% echo 09/2018 when diagnosed with Afib, persistent Afib s/P DCCV 11/2018 didn't maintain NSR so opted for rated control.   Admission 01/2019 with CHF. Was in SNF and required intermittent doses of IM lasix with metolazone. Office visit 01/30/19 with Bernerd Pho at which time she was compensated and stable on torsemide 20 mg BID and O2 3L. Referred to Dr. Lowanda Foster because of peak creatinine 3.52 in April.  Patient admitted yest with acute CHF BNP 3,468 and requiring O2 4L, now improved to baseline 3L after IV lasix. She lives alone but son and daughter bring in her food and stay with her at night. She still has her own tax accounting business. Says she was working yest and had sudden onset of worsening shortness of breath. Denies chest pain, palpitations, dizziness or presyncope. Denies any extra salt-no appetite. Walks with walker. Chronic right leg swelling since her CVA in 2010. Weights have been stable-up and down a pound. BNP 3468, troponins 23, 23, Crt 2.28.Hgb 7.5. Feeling better but not back to baseline.  Heart Pathway Score:     Past Medical History:  Diagnosis Date  . Atrial fibrillation ([place])    a. diagnosed in 09/2018 --> s/p DCCV in 11/2018 but did not maintain NSR --> rate-control strategy pursued  . CAD (coronary artery disease)    a. s/p CABG in 10/2012 at [facility] with  LIMA-LAD, SVG-OM, SVG-Diag via Y graft off OM  . Carcinoid tumor   . CHF (congestive heart failure) ([place])    a. EF previously 40%, at 50% by imaging in 03/2018, 25% by echo 2020  . Colon cancer ([place]) 2008  . DM (diabetes mellitus) ([place])   . HTN (hypertension)   . MI (myocardial infarction) ([place]) FEB 2014   Acute MI of anterior wall  . Pulmonary edema   . Stroke [facility]) 2010   Residual R weakness    Past Surgical History:  Procedure Laterality Date  . BACTERIAL OVERGROWTH TEST N/A 03/18/2015   Procedure: BACTERIAL OVERGROWTH TEST;  Surgeon: Danie Binder, MD;  Location: [facility];  Service: Endoscopy;  Laterality: N/A;  0800  . CARDIAC CATHETERIZATION  08/2008  . CARDIOVERSION N/A 12/03/2018   Procedure: CARDIOVERSION;  Surgeon: Satira Sark, MD;  Location: [facility];  Service: Cardiovascular;  Laterality: N/A;  . CHOLECYSTECTOMY    . COLONOSCOPY N/A 03/03/2015   SLF: 1. One colon polyp removed 2. Mild diverticulosis in the sigmoid colon 3. Small internal hemorrhoids  . CORONARY ARTERY BYPASS GRAFT  10/08/2012  . SHOULDER SURGERY    . TUBAL LIGATION       Home Medications:  Prior to Admission medications   Medication Sig Start Date End Date Taking? Authorizing Provider  Amino Acids-Protein Hydrolys (PRO-STAT PO) Take by mouth.    [provider]  atorvastatin (LIPITOR) 20 MG tablet Take 1  tablet (20 mg total) by mouth daily at 6 PM. 12/05/18   Barton Dubois, MD  cholecalciferol (VITAMIN D) 1000 UNITS tablet Take 1,000 Units by mouth daily.    [provider]  colestipol (COLESTID) 1 g tablet TAKE 1 TABLET BY MOUTH 30 MINUTES BEFORE BREAKFAST AND SUPPER Patient taking differently: Take 1 g by mouth daily.  05/16/16   Carlis Stable, NP  ELIQUIS 5 MG TABS tablet Take 5 mg by mouth 2 (two) times daily. 10/07/18   [provider]  Fluticasone-Salmeterol (ADVAIR DISKUS) 250-50 MCG/DOSE AEPB Inhale 1 puff into the lungs 2 (two) times daily.    [provider]  insulin NPH-regular Human (70-30) 100 UNIT/ML injection Inject 12 Units into the skin 2 (two) times daily with a meal. Patient taking differently: Inject 15 Units into the skin 2 (two) times daily with a meal.  01/07/19   Johnson, Clanford L, MD  Magnesium 400 MG CAPS Take 1 capsule by mouth daily. 11/12/13   Herminio Commons, MD  metoprolol succinate (TOPROL-XL) 50 MG 24 hr tablet Take 1 tablet (50 mg total) by mouth daily. Take with or immediately following a meal. 01/07/19   Johnson, Clanford L, MD  Multiple Vitamin (MULTIVITAMIN) tablet Take 1 tablet by mouth daily.    [provider]  nitroGLYCERIN (NITROSTAT) 0.4 MG SL tablet Place 1 tablet (0.4 mg total) under the tongue every 5 (five) minutes as needed for chest pain. 06/05/17   Herminio Commons, MD  omeprazole (PRILOSEC) 20 MG capsule TAKE 1 CAPSULE BY MOUTH EVERY DAY Patient taking differently: Take 20 mg by mouth daily.  04/17/16   Mahala Menghini, PA-C  tiZANidine (ZANAFLEX) 4 MG tablet Take 4 mg by mouth at bedtime.     [provider]  torsemide (DEMADEX) 20 MG tablet Take 20 mg daily.May take extra 20 mg tablet if weight is over 2 lbs overnight, or, 5 lbs in one week 01/31/19   Ahmed Prima, [place] M, PA-C  travoprost, benzalkonium, (TRAVATAN) 0.004 % ophthalmic solution Place 1 drop into both eyes at bedtime.    [provider]    Inpatient Medications: Scheduled Meds: . apixaban  5 mg Oral BID  . atorvastatin  20 mg Oral q1800  . colestipol  1 g Oral Daily  . furosemide  20 mg Intravenous Q12H  . insulin aspart  0-15 Units Subcutaneous TID WC  . insulin aspart  0-5 Units Subcutaneous QHS  . latanoprost  1 drop Both Eyes QHS  . magnesium oxide  200 mg Oral Daily  . metoprolol succinate  50 mg Oral Daily  . mometasone-formoterol  2 puff Inhalation BID  . multivitamin with minerals  1 tablet Oral Daily  . pantoprazole  40 mg Oral Daily  . sodium chloride flush  3 mL Intravenous Q12H   . tiZANidine  4 mg Oral QHS   Continuous Infusions: . sodium chloride    . [START ON 02/25/2019] cefTRIAXone (ROCEPHIN)  IV     PRN Meds: sodium chloride, acetaminophen **OR** acetaminophen, albuterol, nitroGLYCERIN, ondansetron **OR** ondansetron (ZOFRAN) IV, polyethylene glycol, sodium chloride flush, traZODone  Allergies:    Allergies  Allergen Reactions  . Bee Venom   . Tradjenta [Linagliptin]   . Codeine Nausea Only and Other (See Comments)    Other reaction(s): Other (See Comments) headache    Social History:   Social History   Socioeconomic History  . Marital status: Divorced    Spouse name: Not on file  .  Number of children: Not on file  . Years of education: Not on file  . Highest education level: Not on file  Occupational History  . Not on file  Social Needs  . Financial resource strain: Not on file  . Food insecurity    Worry: Not on file    Inability: Not on file  . Transportation needs    Medical: Not on file    Non-medical: Not on file  Tobacco Use  . Smoking status: Never Smoker  . Smokeless tobacco: Never Used  Substance and Sexual Activity  . Alcohol use: No    Alcohol/week: 0.0 standard drinks  . Drug use: No  . Sexual activity: Not on file  Lifestyle  . Physical activity    Days per week: Not on file    Minutes per session: Not on file  . Stress: Not on file  Relationships  . Social Herbalist on phone: Not on file    Gets together: Not on file    Attends religious service: Not on file    Active member of club or organization: Not on file    Attends meetings of clubs or organizations: Not on file    Relationship status: Not on file  . Intimate partner violence    Fear of current or ex partner: Not on file    Emotionally abused: Not on file    Physically abused: Not on file    Forced sexual activity: Not on file  Other Topics Concern  . Not on file  Social History Narrative  . Not on file    Family History:     Family  History  Problem Relation Age of Onset  . Diabetes Mother   . Heart disease Mother   . Colon cancer Paternal Uncle   . Colon polyps Neg Hx      ROS:  Please see the history of present illness.  Review of Systems  Constitution: Positive for decreased appetite.  HENT: Negative.   Eyes: Negative.   Cardiovascular: Positive for dyspnea on exertion and leg swelling.  Respiratory: Positive for shortness of breath.   Hematologic/Lymphatic: Negative.   Musculoskeletal: Positive for muscle weakness and stiffness. Negative for joint pain.  Gastrointestinal: Negative.   Genitourinary: Negative.   Neurological: Negative.     All other ROS reviewed and negative.     Physical Exam/Data:   Vitals:   02/24/19 0457 02/24/19 0534 02/24/19 0600 02/24/19 0700  BP:   (!) 101/49 (!) 95/47  Pulse:   85 92  Resp:   18 18  Temp:   97.7 F (36.5 C) 97.7 F (36.5 C)  TempSrc:   Oral Oral  SpO2: 100%  100% 99%  Weight:  104.9 kg    Height:  5' (1.524 m)      Intake/Output Summary (Last 24 hours) at 02/24/2019 0857 Last data filed at 02/24/2019 0315 Gross per 24 hour  Intake 100 ml  Output 100 ml  Net 0 ml   Last 3 Weights 02/24/2019 01/30/2019 01/23/2019  Weight (lbs) 231 lb 4.2 oz 235 lb 241 lb  Weight (kg) 104.9 kg 106.595 kg 109.317 kg     Body mass index is 45.17 kg/m.  General:  Obese, in no acute distress HEENT: normal Lymph: no adenopathy Neck: increased JVD and accessory muscle use Endocrine:  No thryomegaly Vascular: No carotid bruits; FA pulses 2+ bilaterally without bruits  Cardiac:  normal S1, S2; irreg irreg-distant HS, no murmur Lungs:  Decreased breath sounds bilaterally, no wheezing, rhonchi or rales  Abd: soft, nontender, no hepatomegaly  MPN:TIRWE leg edema all the way up to her abdomen. Musculoskeletal:  No deformities, BUE and BLE strength normal and equal Skin: warm and dry  Neuro:  CNs 2-12 intact, no focal abnormalities noted Psych:  Normal affect   EKG:   The EKG was personally reviewed and demonstrates:  Afib with poor R wave progression anteriorly. No acute change. Telemetry:  Telemetry was personally reviewed and demonstrates:  Afib with controlled rate  Relevant CV Studies:   Echocardiogram: 09/2018 Technically difficult study due to chest wall/lung interference  Severely decreased left ventricular systolic function, ejection fraction  25%  Segmental wall abnormalities (please see below)  Dilated right ventricle - mild  Mildly decreased right ventricular systolic function  Elevated pulmonary artery systolic pressure - mild  Laboratory Data:  High Sensitivity Troponin:   Recent Labs  Lab 02/24/19 0042 02/24/19 0202  TROPONINIHS 23.00* 23.00*     Cardiac EnzymesNo results for input(s): TROPONINI in the last 168 hours. No results for input(s): TROPIPOC in the last 168 hours.  Chemistry Recent Labs  Lab 02/24/19 0042 02/24/19 0618  NA 137 137  K 5.1 4.9  CL 105 104  CO2 21* 25  GLUCOSE 75 85  BUN 44* 46*  CREATININE 2.25* 2.28*  CALCIUM 8.8* 8.6*  GFRNONAA 22* 22*  GFRAA 26* 25*  ANIONGAP 11 8    Recent Labs  Lab 02/24/19 0042  PROT 6.8  ALBUMIN 3.2*  AST 26  ALT 15  ALKPHOS 110  BILITOT 1.9*   Hematology Recent Labs  Lab 02/24/19 0042 02/24/19 0618  WBC 13.5* 12.5*  RBC 3.88 3.38*  HGB 8.6* 7.5*  HCT 30.6* 26.4*  MCV 78.9* 78.1*  MCH 22.2* 22.2*  MCHC 28.1* 28.4*  RDW 18.8* 18.8*  PLT 183 154   BNP Recent Labs  Lab 02/24/19 0042  BNP 3,468.0*    DDimer No results for input(s): DDIMER in the last 168 hours.   Radiology/Studies:  Dg Chest Portable 1 View  Result Date: 02/24/2019 CLINICAL DATA:  68 year old female with shortness of breath. EXAM: PORTABLE CHEST 1 VIEW COMPARISON:  Chest radiograph dated 01/06/2019 FINDINGS: Interval removal of the right IJ central line. There is shallow inspiration with bibasilar atelectasis. Probable bilateral small pleural effusions. No pneumothorax.  Stable cardiomegaly with mild vascular congestion. Median sternotomy wires. No acute osseous pathology. IMPRESSION: Cardiomegaly with mild vascular congestion and small bilateral pleural effusions. Electronically Signed   By: Anner Crete M.D.   On: 02/24/2019 01:23    Assessment and Plan:   1. Acute on chronic combined systolic and diastolic CHF-BNP 3154, I/O's only showing 100 cc after lasix 20 mg BIDout weight 231 which is her baseline. Torsemide decreased to 20 mg once daily 01/31/19 because of rising Crt. She was told to take extra as needed. Continue to diureses. 2. CAD S/P CABG 2014-no angina, troponin flat at 23 x 2 3. Permanent Afib after returned to afib after DCCV 09/2018 on eliquis 4. CKD stage 3-awaiting appt with Dr. Lowanda Foster 5. Essential HTN-BP running low 6. HLD on atorvastatin followed by PCP 7. Anemia-Hgb 7.5-8.6 yest, 8.2 01/07/19 on eliquis ? GI consult. Patient denies melena and didn't know she was anemic. 8. Right leg cellulitis on IV rocephin      For questions or updates, please contact [place] Please consult [email or web address] for contact info under     Signed, Ermalinda Barrios, PA-C  02/24/2019 8:57  AM   Patient seen and discussed with PA Bonnell Public, I agree with her documentation. 68 yo female history of CAD with CABG in 08/9507, chronic systolic HF, persistent afib under rate control, CKD 3, DM2, CKD 3, prior CVA, home O2 3L. Baseline weights 230-235 lbs. Presents with progressing SOB and LE edema    WBC 13.5 Hgb 8.6 Plt 183 K 5.1 Cr 2.25 BUN 44 BNP 3468 Lactic acid 1.2  Hs trop 23-->23--> COVID neg  CXR mild congestoin 09/2018 echo: LVEF 25% EKG afib, RAD   Acute on chronic systolic HF, BNP up to 3267 and pulm edema by CXR. Documented weights 128 actually lower than her prior clinic appointment. Started on IV lasix 20mg  bid, limited uop thus far. Stable renal function. Prior admits had done well with lasix 80mg  IV bid, I think will need higher dosing.  Soft bp's, will have to monitor with diuresis, would at least try lasix 40mg  IV bid. With soft bp's and worsening HF repeat echo.   Carlyle Dolly MD

## 2019-02-24 NOTE — Progress Notes (Signed)
Initial Nutrition Assessment  DOCUMENTATION CODES:   Morbid obesity  INTERVENTION:  Heart Healthy/Consisitent CHO diet education provided   NUTRITION DIAGNOSIS:   Predicted suboptimal nutrient intake related to acute illness, chronic illness(acute exacerbation of chronic CHF) as evidenced by per patient/family report, energy intake < or equal to 75% for > or equal to 1 month(patient has poor appetite (has no taste for food), choices are limited to convience foods such as sandwiches and what family brings to her).   GOAL:  (Patient will continue to follow heart healthy / cho mod diet as she discharges either home or to SNF setting)   MONITOR:  PO intake, Labs, Weight trends, Skin    REASON FOR ASSESSMENT:   Consult Assessment of nutrition requirement/status  ASSESSMENT: pt is an obese 68 yo female with CKD-3,  CAD, DM, CHF, MI, Stroke, carcinoid tumor and PVD. Presents from home with acute CHF.   Patient is winded with answering questions. Her meal intake has been diminished due to lack of appetite. Patient has "lost her taste" for food and eats daily only because she understands the need.  Breakfast is yogurt and cup of skim milk with her morning meds. Lunch and dinner are often a sandwich (pimento cheese and pbj-favorites). Her children bring food in the evening but pt says she usually throws away due to their food choices. Her sister recently brought in a fruit which she enjoys and toss salad. Her beverage is unsweet tea. At times she has been on fluid restriction. Restricting fluid is difficult for her due to complains of being thirsty.She doesn't add salt to her food and limits processed foods.   Patient has received Heart Healthy/CHO modified diet education during past hospitalizations but kindly allowed RD to review the information with her anyway. Handout: Heart Healthy /Consistent CHO nutrition therapy given to patient.    Patient weighs herself daily and reports range of  238-242 lb. She is currently 3% below her usual range. Patient says she has been sick off and on since February this year and had multiple hospitalizations and was at rehab for several weeks.   Labs: BUN 46 (H), Cr. 2.28 (H)  BMP Latest Ref Rng & Units 02/24/2019 02/24/2019 02/14/2019  Glucose 70 - 99 mg/dL 85 75 28(LL)  BUN 8 - 23 mg/dL 46(H) 44(H) 36(H)  Creatinine 0.44 - 1.00 mg/dL 2.28(H) 2.25(H) 2.08(H)  BUN/Creat Ratio 6 - 22 (calc) - - 17  Sodium 135 - 145 mmol/L 137 137 141  Potassium 3.5 - 5.1 mmol/L 4.9 5.1 4.5  Chloride 98 - 111 mmol/L 104 105 103  CO2 22 - 32 mmol/L 25 21(L) 26  Calcium 8.9 - 10.3 mg/dL 8.6(L) 8.8(L) 9.1     NUTRITION - FOCUSED PHYSICAL EXAM: deferred -patient is getting and ECHO during RD visit.   Diet Order:   Diet Order            Diet heart healthy/carb modified Room service appropriate? Yes; Fluid consistency: Thin  Diet effective now              EDUCATION NEEDS:  Education needs have been addressed   Skin:  Skin Assessment: Reviewed RN Assessment(Weeping and MASD)  Last BM:  7/19  Height:   Ht Readings from Last 1 Encounters:  02/24/19 5' (1.524 m)    Weight:   Wt Readings from Last 1 Encounters:  02/24/19 104.9 kg    Ideal Body Weight:  45 kg  BMI:  Body mass index is 45.17  kg/m.  Estimated Nutritional Needs:   Kcal:  2284-0698 (MSJ x 1.1-1.2 AF)  Protein:  65-70 gr  Fluid:  <2 liters daily   Colman Cater MS,RD,CSG,LDN Office: [phone] Pager: [phone]

## 2019-02-24 NOTE — Progress Notes (Signed)
*  PRELIMINARY RESULTS* Echocardiogram 2D Echocardiogram has been performed with [facility].  Erica [name] 02/24/2019, 2:46 PM

## 2019-02-24 NOTE — ED Triage Notes (Signed)
Pt C/O SOB that began yesterday. 92% on 4l. Pt normally wears 3l at home.

## 2019-02-24 NOTE — Progress Notes (Signed)
PHARMACY NOTE:  ANTIMICROBIAL RENAL DOSAGE ADJUSTMENT  Current antimicrobial regimen includes a mismatch between antimicrobial dosage and estimated renal function.  As per policy approved by the Pharmacy & Therapeutics and Medical Executive Committees, the antimicrobial dosage will be adjusted accordingly.  Current antimicrobial dosage:  cefazolin 1g IVq8h  Indication: cellulitis  Renal Function:  Estimated Creatinine Clearance: 26.6 mL/min (A) (by C-G formula based on SCr of 2.28 mg/dL (H)). []      On intermittent HD, scheduled: []      On CRRT    Antimicrobial dosage has been changed to:  cefazolin 1g IV q12h   Additional comments:   Thank you for allowing pharmacy to be a part of this patient's care.  Despina Pole, [facility] 02/24/2019 9:19 AM

## 2019-02-24 NOTE — Progress Notes (Signed)
Blood gas was drawn under arterial order rather than venous order. Results listed as arterial but is actually venous. DR Rancor was ok with venous result so patient did not have to be redrawn. Panic value for PO2 is ok for venous and not truly panic. Nurse is aware as she contacted RT.

## 2019-02-24 NOTE — Progress Notes (Signed)
PROGRESS NOTE  Erica [name] HUT:[id number] DOB: [birth year] DOA: 02/23/2019 PCP: Curlene Labrum, MD  Brief History:  68 year old female with a history of coronary artery disease with CABG, carotid stenosis, systolic and diastolic CHF, persistent atrial fibrillation status post DCCV 11/2018, hypertension, CKD stage III, diabetes mellitus, chronic respiratory failure on 3 L presenting with 2-day history of worsening shortness of breath, orthopnea, and increasing oxygen requirement.  The patient states that her lower extremity edema has been a chronic problem for her.  Has not been significantly worsened.  She complains of drainage of clear fluid of her left greater than right leg.  She denies any fevers, chills, chest pain, dizziness, nausea, vomiting, diarrhea, abdominal pain, dysuria, hematuria.  The patient has been compliant with all her medications.  She weighs herself on a daily basis.  She stated her last weight was 238.6 pounds on the day prior to admission.  The patient was seen in the cardiology office on 01/30/2019 during which time her weight was 235 pounds.  The patient had a recent mission to the hospital from 01/01/2019 through 01/07/2019 after treatment for bacteremia, cellulitis, and acute on chronic combined CHF.  She was discharged home with torsemide 40 mg daily at that time.  She has since changed her torsemide dosing to 20 mg twice daily.  Upon presentation, the patient had a low-grade temperature 99.1 F with tachycardia 110-120 and soft blood pressures with systolic BP in the low 65K.  Serum creatinine was noted to be 2.08 with unremarkable LFTs.  WBCs of 13.5.  EKG showed atrial fibrillation with nonspecific T wave changes.  Lactic acid was 1.2 per troponin was 23 >>> 23 chest x-ray showed vascular congestion with small bilateral pleural effusions.  Assessment/Plan: Acute on chronic respiratory failure with hypoxia -Secondary to decompensated CHF -Patient has been weaned  back to her baseline 3 L  Acute on chronic combined systolic and diastolic CHF -Continue furosemide 20 mg IV twice daily -Cardiology consult -Daily weights -Continue metoprolol succinate -01/07/2019 discharge weight 248.9 pounds -02/24/2019 admission weight ?  Cellulitis RLE -see pictures below -d/c ceftriaxone -start cefazolin  Persistent atrial fibrillation -s/p DCCV in 11/2018 but did not maintain NSR --> rate-control strategy pursued -Continue apixaban -Continue metoprolol succinate -09/30/2018 echo EF 25%, mild dilated RV, HK apical anterior septal  CKD stage III -The patient's renal function has been widely variable -Appears that the patient has new renal baseline is 1.7-2.0 -Concerned about the degree of cardiorenal syndrome -Monitor with diuresis  Diabetes mellitus type 2 -01/02/2019 hemoglobin A1c 5.5 -Discontinue 70/30 -NovoLog sliding scale -Monitor CBGs  Hyperlipidemia -Continue statin  Essential hypertension -Blood pressure is well controlled, in fact soft      Disposition Plan:   Home in 2-3 days  Family Communication:   Daughter updated on phone  Consultants:  cardiology  Code Status:  FULL  DVT Prophylaxis:  apixaban   Procedures: As Listed in Progress Note Above  Antibiotics: cefazolin    The patient is critically ill with multiple organ systems failure and requires high complexity decision making for assessment and support, frequent evaluation and titration of therapies, application of advanced monitoring technologies and extensive interpretation of multiple databases.  Critical care time - 40 mins.     Subjective: Pt still has some sob but improving. Patient denies fevers, chills, headache, chest pain, dyspnea, nausea, vomiting, diarrhea, abdominal pain, dysuria, hematuria, hematochezia, and melena.   Objective: Vitals:   02/24/19  0457 02/24/19 0534 02/24/19 0600 02/24/19 0700  BP:   (!) 101/49 (!) 95/47  Pulse:   85 92  Resp:    18 18  Temp:   97.7 F (36.5 C) 97.7 F (36.5 C)  TempSrc:   Oral Oral  SpO2: 100%  100% 99%  Weight:  104.9 kg    Height:  5' (1.524 m)      Intake/Output Summary (Last 24 hours) at 02/24/2019 0854 Last data filed at 02/24/2019 0315 Gross per 24 hour  Intake 100 ml  Output 100 ml  Net 0 ml   Weight change:  Exam:   General:  Pt is alert, follows commands appropriately, not in acute distress  HEENT: No icterus, No thrush, No neck mass, [place]/AT  Cardiovascular: IRRR, S1/S2, no rubs, no gallops  Respiratory: bibasilar crackles, no wheeze  Abdomen: Soft/+BS, non tender, non distended, no guarding  Extremities: 1+LE edema, No lymphangitis, No petechiae, No rashes, no synovitis     Data Reviewed: I have personally reviewed following labs and imaging studies Basic Metabolic Panel: Recent Labs  Lab 02/24/19 0042 02/24/19 0618  NA 137 137  K 5.1 4.9  CL 105 104  CO2 21* 25  GLUCOSE 75 85  BUN 44* 46*  CREATININE 2.25* 2.28*  CALCIUM 8.8* 8.6*   Liver Function Tests: Recent Labs  Lab 02/24/19 0042  AST 26  ALT 15  ALKPHOS 110  BILITOT 1.9*  PROT 6.8  ALBUMIN 3.2*   No results for input(s): LIPASE, AMYLASE in the last 168 hours. No results for input(s): AMMONIA in the last 168 hours. Coagulation Profile: No results for input(s): INR, PROTIME in the last 168 hours. CBC: Recent Labs  Lab 02/24/19 0042 02/24/19 0618  WBC 13.5* 12.5*  NEUTROABS 11.2*  --   HGB 8.6* 7.5*  HCT 30.6* 26.4*  MCV 78.9* 78.1*  PLT 183 154   Cardiac Enzymes: No results for input(s): CKTOTAL, CKMB, CKMBINDEX, TROPONINI in the last 168 hours. BNP: Invalid input(s): POCBNP CBG: Recent Labs  Lab 02/24/19 0810  GLUCAP 82   HbA1C: No results for input(s): HGBA1C in the last 72 hours. Urine analysis:    Component Value Date/Time   COLORURINE YELLOW 01/01/2019 0433   APPEARANCEUR HAZY (A) 01/01/2019 0433   LABSPEC 1.009 01/01/2019 0433   PHURINE 5.0 01/01/2019 0433    GLUCOSEU NEGATIVE 01/01/2019 0433   HGBUR LARGE (A) 01/01/2019 0433   BILIRUBINUR NEGATIVE 01/01/2019 0433   KETONESUR NEGATIVE 01/01/2019 0433   PROTEINUR NEGATIVE 01/01/2019 0433   UROBILINOGEN 0.2 11/05/2008 0627   NITRITE NEGATIVE 01/01/2019 0433   LEUKOCYTESUR NEGATIVE 01/01/2019 0433   Sepsis Labs: @LABRCNTIP (procalcitonin:4,lacticidven:4) ) Recent Results (from the past 240 hour(s))  SARS Coronavirus 2 (CEPHEID- Performed in [facility] hospital lab), Hosp Order     Status: None   Collection Time: 02/24/19 12:50 AM   Specimen: Nasopharyngeal Swab  Result Value Ref Range Status   SARS Coronavirus 2 NEGATIVE NEGATIVE Final    Comment: (NOTE) If result is NEGATIVE SARS-CoV-2 target nucleic acids are NOT DETECTED. The SARS-CoV-2 RNA is generally detectable in upper and lower  respiratory specimens during the acute phase of infection. The lowest  concentration of SARS-CoV-2 viral copies this assay can detect is 250  copies / mL. A negative result does not preclude SARS-CoV-2 infection  and should not be used as the sole basis for treatment or other  patient management decisions.  A negative result may occur with  improper specimen collection / handling,  submission of specimen other  than nasopharyngeal swab, presence of viral mutation(s) within the  areas targeted by this assay, and inadequate number of viral copies  (<250 copies / mL). A negative result must be combined with clinical  observations, patient history, and epidemiological information. If result is POSITIVE SARS-CoV-2 target nucleic acids are DETECTED. The SARS-CoV-2 RNA is generally detectable in upper and lower  respiratory specimens dur ing the acute phase of infection.  Positive  results are indicative of active infection with SARS-CoV-2.  Clinical  correlation with patient history and other diagnostic information is  necessary to determine patient infection status.  Positive results do  not rule out  bacterial infection or co-infection with other viruses. If result is PRESUMPTIVE POSTIVE SARS-CoV-2 nucleic acids MAY BE PRESENT.   A presumptive positive result was obtained on the submitted specimen  and confirmed on repeat testing.  While 2019 novel coronavirus  (SARS-CoV-2) nucleic acids may be present in the submitted sample  additional confirmatory testing may be necessary for epidemiological  and / or clinical management purposes  to differentiate between  SARS-CoV-2 and other Sarbecovirus currently known to infect humans.  If clinically indicated additional testing with an alternate test  methodology [phone]) is advised. The SARS-CoV-2 RNA is generally  detectable in upper and lower respiratory sp ecimens during the acute  phase of infection. The expected result is Negative. Fact Sheet for Patients:  StrictlyIdeas.no Fact Sheet for Healthcare Providers: BankingDealers.co.za This test is not yet approved or cleared by the [place] FDA and has been authorized for detection and/or diagnosis of SARS-CoV-2 by FDA under an Emergency Use Authorization (EUA).  This EUA will remain in effect (meaning this test can be used) for the duration of the COVID-19 declaration under Section 564(b)(1) of the Act, 21 U.S.C. section 360bbb-3(b)(1), unless the authorization is terminated or revoked sooner. Performed at [facility], [address] [place], [place] [address]   Blood culture (routine x 2)     Status: None (Preliminary result)   Collection Time: 02/24/19  2:24 AM   Specimen: BLOOD LEFT WRIST  Result Value Ref Range Status   Specimen Description BLOOD LEFT WRIST  Final   Special Requests   Final    BOTTLES DRAWN AEROBIC AND ANAEROBIC Blood Culture results may not be optimal due to an inadequate volume of blood received in culture bottles   Culture   Final    NO GROWTH < 12 HOURS Performed at [facility], [address],  [place], [place] [address]    Report Status PENDING  Incomplete  Blood culture (routine x 2)     Status: None (Preliminary result)   Collection Time: 02/24/19  2:33 AM   Specimen: BLOOD RIGHT HAND  Result Value Ref Range Status   Specimen Description BLOOD RIGHT HAND  Final   Special Requests   Final    BOTTLES DRAWN AEROBIC AND ANAEROBIC Blood Culture adequate volume   Culture   Final    NO GROWTH < 12 HOURS Performed at [facility], [address] [place], [place] [address]    Report Status PENDING  Incomplete     Scheduled Meds: . apixaban  5 mg Oral BID  . atorvastatin  20 mg Oral q1800  . colestipol  1 g Oral Daily  . furosemide  20 mg Intravenous Q12H  . insulin aspart  0-15 Units Subcutaneous TID WC  . insulin aspart  0-5 Units Subcutaneous QHS  . Magnesium   Oral Daily  .  metoprolol succinate  50 mg Oral Daily  . mometasone-formoterol  2 puff Inhalation BID  . multivitamin with minerals  1 tablet Oral Daily  . pantoprazole  40 mg Oral Daily  . sodium chloride flush  3 mL Intravenous Q12H  . tiZANidine  4 mg Oral QHS  . Travoprost (BAK Free)  1 drop Both Eyes QHS   Continuous Infusions: . sodium chloride    . [START ON 02/25/2019] cefTRIAXone (ROCEPHIN)  IV      Procedures/Studies: Dg Chest Portable 1 View  Result Date: 02/24/2019 CLINICAL DATA:  68 year old female with shortness of breath. EXAM: PORTABLE CHEST 1 VIEW COMPARISON:  Chest radiograph dated 01/06/2019 FINDINGS: Interval removal of the right IJ central line. There is shallow inspiration with bibasilar atelectasis. Probable bilateral small pleural effusions. No pneumothorax. Stable cardiomegaly with mild vascular congestion. Median sternotomy wires. No acute osseous pathology. IMPRESSION: Cardiomegaly with mild vascular congestion and small bilateral pleural effusions. Electronically Signed   By: Anner Crete M.D.   On: 02/24/2019 01:23    Orson Eva, DO  [facility] Pager [phone]  If  7PM-7AM, please contact night-coverage [email or web address] Password [email or web address] 02/24/2019, 8:54 AM   LOS: 0 days

## 2019-02-24 NOTE — ED Notes (Signed)
Spoke with RT and York Cerise in lab who both confirmed that resulted ABG's listed as arterial were actually venous results.

## 2019-02-24 NOTE — ED Provider Notes (Signed)
[facility] EMERGENCY DEPARTMENT Provider Note   CSN: [id number] Arrival date & time: 02/23/19  2347     History   Chief Complaint Chief Complaint  Patient presents with  . Shortness of Breath    HPI Erica [name] is a 68 y.o. female.     Erica [name] is a 68 y.o. female with past medical history of CAD (s/p CABG in 10/2012), carotid artery stenosis, chronic combined systolic and diastolic CHF (EF previously 40%, 50% by repeat imaging in 03/2018, at 25% by echo in 09/2018),persistent atrial fibrillation (diagnosed in 09/2018, s/p DCCV in 11/2018 but did not maintain NSR --> rate-control strategy pursued),HTN, HLD, IDDM,Stage 3 CKD,and prior CVA presenting with 2 days of increased shortness of breath and new oxygen requirement.  Has had an increase her 3 L at home to 4 L.  States has not been able to breathe for the past several days denies any cough, fever, chest pain.  States her leg swelling is at baseline.  States compliance with her diuretics.  Denies any abdominal pain, nausea or vomiting.  No fever.  No pain with urination or blood in the urine.  The history is provided by the patient.  Shortness of Breath Associated symptoms: no abdominal pain, no fever, no headaches and no vomiting     Past Medical History:  Diagnosis Date  . Atrial fibrillation ([place])    a. diagnosed in 09/2018 --> s/p DCCV in 11/2018 but did not maintain NSR --> rate-control strategy pursued  . CAD (coronary artery disease)    a. s/p CABG in 10/2012 at [facility] with LIMA-LAD, SVG-OM, SVG-Diag via Y graft off OM  . Carcinoid tumor   . CHF (congestive heart failure) ([place])    a. EF previously 40%, at 50% by imaging in 03/2018, 25% by echo 2020  . Colon cancer ([place]) 2008  . DM (diabetes mellitus) ([place])   . HTN (hypertension)   . MI (myocardial infarction) ([place]) FEB 2014   Acute MI of anterior wall  . Pulmonary edema   . Stroke [facility]) 2010   Residual R weakness    Patient Active Problem List   Diagnosis Date Noted  . Acute on chronic combined systolic and diastolic CHF (congestive heart failure) ([place])   . Coronary artery disease of bypass graft of native heart with stable angina pectoris ([place])   . Bacteremia   . Cellulitis of right leg   . Pleural effusion   . Pulmonary edema   . Systolic congestive heart failure ([place])   . Pressure injury of skin 01/04/2019  . Acute exacerbation of CHF (congestive heart failure) ([place]) 01/01/2019  . Hypoxia   . Acute renal failure superimposed on stage 3 chronic kidney disease ([place])   . Persistent atrial fibrillation   . Acute on chronic systolic (congestive) heart failure ([place]) 11/29/2018  . 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 Obesity, Class III, BMI 40-49.9 (morbid obesity) ([place]) 08/02/2018  . GERD (gastroesophageal reflux disease) 04/14/2015  . Diarrhea 12/10/2013  . Carcinoid tumor   . CVA (cerebral vascular accident) ([place]) 03/07/2013  . 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  . Hyperlipidemia LDL goal <70 04/30/2009  . OVERWEIGHT/OBESITY 04/30/2009  . Essential hypertension 04/30/2009  . CAD, NATIVE VESSEL 04/30/2009  . BRUIT 04/30/2009    Past Surgical History:  Procedure Laterality Date  . BACTERIAL OVERGROWTH TEST N/A 03/18/2015   Procedure: BACTERIAL OVERGROWTH TEST;  Surgeon: Danie Binder, MD;  Location: [facility] ENDO SUITE;  Service:  Endoscopy;  Laterality: N/A;  0800  . CARDIAC CATHETERIZATION  08/2008  . CARDIOVERSION N/A 12/03/2018   Procedure: CARDIOVERSION;  Surgeon: Satira Sark, MD;  Location: [facility] ENDO SUITE;  Service: Cardiovascular;  Laterality: N/A;  . CHOLECYSTECTOMY    . COLONOSCOPY N/A 03/03/2015   SLF: 1. One colon polyp removed 2. Mild diverticulosis in the sigmoid colon 3. Small internal hemorrhoids  . CORONARY ARTERY BYPASS GRAFT  10/08/2012  . SHOULDER SURGERY    . TUBAL LIGATION       OB History   No obstetric history on file.      Home Medications    Prior to Admission medications   Medication  Sig Start Date End Date Taking? Authorizing Provider  Amino Acids-Protein Hydrolys (PRO-STAT PO) Take by mouth.    [provider]  atorvastatin (LIPITOR) 20 MG tablet Take 1 tablet (20 mg total) by mouth daily at 6 PM. 12/05/18   Barton Dubois, MD  cholecalciferol (VITAMIN D) 1000 UNITS tablet Take 1,000 Units by mouth daily.    [provider]  colestipol (COLESTID) 1 g tablet TAKE 1 TABLET BY MOUTH 30 MINUTES BEFORE BREAKFAST AND SUPPER Patient taking differently: Take 1 g by mouth daily.  05/16/16   Carlis Stable, NP  ELIQUIS 5 MG TABS tablet Take 5 mg by mouth 2 (two) times daily. 10/07/18   [provider]  Fluticasone-Salmeterol (ADVAIR DISKUS) 250-50 MCG/DOSE AEPB Inhale 1 puff into the lungs 2 (two) times daily.    [provider]  insulin NPH-regular Human (70-30) 100 UNIT/ML injection Inject 12 Units into the skin 2 (two) times daily with a meal. Patient taking differently: Inject 15 Units into the skin 2 (two) times daily with a meal.  01/07/19   Johnson, Clanford L, MD  Magnesium 400 MG CAPS Take 1 capsule by mouth daily. 11/12/13   Herminio Commons, MD  metoprolol succinate (TOPROL-XL) 50 MG 24 hr tablet Take 1 tablet (50 mg total) by mouth daily. Take with or immediately following a meal. 01/07/19   Johnson, Clanford L, MD  Multiple Vitamin (MULTIVITAMIN) tablet Take 1 tablet by mouth daily.    [provider]  nitroGLYCERIN (NITROSTAT) 0.4 MG SL tablet Place 1 tablet (0.4 mg total) under the tongue every 5 (five) minutes as needed for chest pain. 06/05/17   Herminio Commons, MD  omeprazole (PRILOSEC) 20 MG capsule TAKE 1 CAPSULE BY MOUTH EVERY DAY Patient taking differently: Take 20 mg by mouth daily.  04/17/16   Mahala Menghini, PA-C  tiZANidine (ZANAFLEX) 4 MG tablet Take 4 mg by mouth at bedtime.     [provider]  torsemide (DEMADEX) 20 MG tablet Take 20 mg daily.May take extra 20 mg tablet if weight is over 2 lbs overnight,  or, 5 lbs in one week 01/31/19   Ahmed Prima, [place] M, PA-C  travoprost, benzalkonium, (TRAVATAN) 0.004 % ophthalmic solution Place 1 drop into both eyes at bedtime.    [provider]    Family History Family History  Problem Relation Age of Onset  . Diabetes Mother   . Heart disease Mother   . Colon cancer Paternal Uncle   . Colon polyps Neg Hx     Social History Social History   Tobacco Use  . Smoking status: Never Smoker  . Smokeless tobacco: Never Used  Substance Use Topics  . Alcohol use: No    Alcohol/week: 0.0 standard drinks  . Drug use: No  Allergies   Bee venom, Tradjenta [linagliptin], and Codeine   Review of Systems Review of Systems  Constitutional: Negative for activity change, appetite change and fever.  HENT: Negative for congestion and rhinorrhea.   Respiratory: Positive for chest tightness and shortness of breath.   Cardiovascular: Positive for leg swelling.  Gastrointestinal: Negative for abdominal pain, nausea and vomiting.  Genitourinary: Negative for dysuria, hematuria, vaginal bleeding and vaginal discharge.  Musculoskeletal: Negative for arthralgias and myalgias.  Skin: Positive for wound.  Neurological: Negative for dizziness, weakness and headaches.   all other systems are negative except as noted in the HPI and PMH.     Physical Exam Updated Vital Signs BP (!) 99/53   Pulse (!) 106   Temp 99.1 F (37.3 C) (Oral)   Resp (!) 31   SpO2 100%   Physical Exam Vitals signs and nursing note reviewed.  Constitutional:      General: She is in acute distress.     Appearance: She is well-developed. She is obese. She is ill-appearing.     Comments: Moderately increased work of breathing, speaking short phrases  HENT:     Head: Normocephalic and atraumatic.     Nose: Nose normal. No congestion or rhinorrhea.     Mouth/Throat:     Pharynx: No oropharyngeal exudate.  Eyes:     Conjunctiva/sclera: Conjunctivae normal.     Pupils:  Pupils are equal, round, and reactive to light.  Neck:     Musculoskeletal: Normal range of motion and neck supple.     Comments: No meningismus. Cardiovascular:     Rate and Rhythm: Normal rate. Rhythm irregular.     Heart sounds: Normal heart sounds. No murmur.  Pulmonary:     Effort: Pulmonary effort is normal. No respiratory distress.     Breath sounds: Rales present.     Comments: Bibasilar crackles Abdominal:     Palpations: Abdomen is soft.     Tenderness: There is no abdominal tenderness. There is no guarding or rebound.  Musculoskeletal: Normal range of motion.        General: No tenderness.     Right lower leg: Edema present.     Left lower leg: Edema present.     Comments: Shallow ulceration duration  Skin:    General: Skin is warm.     Capillary Refill: Capillary refill takes less than 2 seconds.  Neurological:     General: No focal deficit present.     Mental Status: She is alert and oriented to person, place, and time. Mental status is at baseline.     Cranial Nerves: No cranial nerve deficit.     Motor: No abnormal muscle tone.     Coordination: Coordination normal.     Comments: No ataxia on finger to nose bilaterally. No pronator drift. 5/5 strength throughout. CN 2-12 intact.Equal grip strength. Sensation intact.   Psychiatric:        Behavior: Behavior normal.      ED Treatments / Results  Labs (all labs ordered are listed, but only abnormal results are displayed) Labs Reviewed  CBC WITH DIFFERENTIAL/PLATELET - Abnormal; Notable for the following components:      Result Value   WBC 13.5 (*)    Hemoglobin 8.6 (*)    HCT 30.6 (*)    MCV 78.9 (*)    MCH 22.2 (*)    MCHC 28.1 (*)    RDW 18.8 (*)    Neutro Abs 11.2 (*)    Monocytes  Absolute 1.1 (*)    All other components within normal limits  COMPREHENSIVE METABOLIC PANEL - Abnormal; Notable for the following components:   CO2 21 (*)    BUN 44 (*)    Creatinine, Ser 2.25 (*)    Calcium 8.8 (*)     Albumin 3.2 (*)    Total Bilirubin 1.9 (*)    GFR calc non Af Amer 22 (*)    GFR calc Af Amer 26 (*)    All other components within normal limits  BRAIN NATRIURETIC PEPTIDE - Abnormal; Notable for the following components:   B Natriuretic Peptide 3,468.0 (*)    All other components within normal limits  BLOOD GAS, ARTERIAL - Abnormal; Notable for the following components:   pO2, Arterial <31.0 (*)    Allens test (pass/fail) NOT INDICATED (*)    All other components within normal limits  TROPONIN I (HIGH SENSITIVITY) - Abnormal; Notable for the following components:   Troponin I (High Sensitivity) 23.00 (*)    All other components within normal limits  TROPONIN I (HIGH SENSITIVITY) - Abnormal; Notable for the following components:   Troponin I (High Sensitivity) 23.00 (*)    All other components within normal limits  SARS CORONAVIRUS 2 (HOSPITAL ORDER, [facility])  CULTURE, BLOOD (ROUTINE X 2)  CULTURE, BLOOD (ROUTINE X 2)  LACTIC ACID, PLASMA  LACTIC ACID, PLASMA  URINALYSIS, ROUTINE W REFLEX MICROSCOPIC    EKG EKG Interpretation  Date/Time:  Monday February 24 2019 00:05:38 EDT Ventricular Rate:  98 PR Interval:    QRS Duration: 89 QT Interval:  345 QTC Calculation: 441 R Axis:   127 Text Interpretation:  Atrial fibrillation Anterior infarct, old Borderline repolarization abnormality No significant change was found Confirmed by Ezequiel Essex [phone]) on 02/24/2019 12:09:08 AM   Radiology Dg Chest Portable 1 View  Result Date: 02/24/2019 CLINICAL DATA:  68 year old female with shortness of breath. EXAM: PORTABLE CHEST 1 VIEW COMPARISON:  Chest radiograph dated 01/06/2019 FINDINGS: Interval removal of the right IJ central line. There is shallow inspiration with bibasilar atelectasis. Probable bilateral small pleural effusions. No pneumothorax. Stable cardiomegaly with mild vascular congestion. Median sternotomy wires. No acute osseous pathology.  IMPRESSION: Cardiomegaly with mild vascular congestion and small bilateral pleural effusions. Electronically Signed   By: Anner Crete M.D.   On: 02/24/2019 01:23    Procedures Procedures (including critical care time)  Medications Ordered in ED Medications - No data to display   Initial Impression / Assessment and Plan / ED Course  I have reviewed the triage vital signs and the nursing notes.  Pertinent labs & imaging results that were available during my care of the patient were reviewed by me and considered in my medical decision making (see chart for details).       Patient with difficulty breathing.  Suspect CHF exacerbation.  She was given IV Lasix on arrival.  Her EKG shows no acute ischemia.  Labs show leukocytosis of 13.  T-max 99.  Will check rectal temperature.  Patient and daughter deny any fever at home.  Will check urinalysis.  She does have a chronic wound to her shin that is draining clear fluid. IV rocephin given.  Pressure remains soft in the 80s and 90s.  She is tachypneic.  She is given IV Lasix for increased work of breathing and edema on chest x-ray.  Lactate and blood cultures and rectal temperature will be checked to evaluate for occult sepsis.  Lactate is  normal.  Blood pressure has improved to the 578 systolic.  Patient denies chest pain. Remains tachypneic ABG does not show any significant CO2 retention. Creatinine 2.25 from her baseline of 1.5.  She will need admission for further evaluation of her CHF and need for IV diuresis.  Her kidney function will need to be monitored closely.  Her mental status is normal her blood pressure is improving.  Admission discussed with Dr. Denton Brick.   CRITICAL CARE Performed by: Ezequiel Essex Total critical care time: 40 minutes Critical care time was exclusive of separately billable procedures and treating other patients. Critical care was necessary to treat or prevent imminent or life-threatening  deterioration. Critical care was time spent personally by me on the following activities: development of treatment plan with patient and/or surrogate as well as nursing, discussions with consultants, evaluation of patient's response to treatment, examination of patient, obtaining history from patient or surrogate, ordering and performing treatments and interventions, ordering and review of laboratory studies, ordering and review of radiographic studies, pulse oximetry and re-evaluation of patient's condition.  TASHIYA SOUDERS was evaluated in Emergency Department on 02/24/2019 for the symptoms described in the history of present illness. She was evaluated in the context of the global COVID-19 pandemic, which necessitated consideration that the patient might be at risk for infection with the SARS-CoV-2 virus that causes COVID-19. Institutional protocols and algorithms that pertain to the evaluation of patients at risk for COVID-19 are in a state of rapid change based on information released by regulatory bodies including the CDC and federal and state organizations. These policies and algorithms were followed during the patient's care in the ED.    Final Clinical Impressions(s) / ED Diagnoses   Final diagnoses:  Acute on chronic combined systolic and diastolic congestive heart failure [facility])    ED Discharge Orders    None       Ezequiel Essex, MD 02/24/19 [phone]

## 2019-02-24 NOTE — Plan of Care (Signed)

## 2019-02-24 NOTE — H&P (Signed)
Patient Demographics:    Erica [name], is a 68 y.o. female  MRN: [id number]   DOB - 03-10-1951  Admit Date - 02/23/2019  Outpatient Primary MD for the patient is Burdine, Virgina Evener, MD   Assessment & Plan:    Principal Problem:   Acute on chronic combined systolic and diastolic CHF (congestive heart failure) ([place]) Active Problems:   Acute on chronic respiratory failure with hypoxia ([place])   Essential hypertension   CAD/S/P CABG x 3   Type 2 diabetes with nephropathy (HCC)   Persistent atrial fibrillation   Acute renal failure superimposed on stage 3 chronic kidney disease (HCC)   Cellulitis of right leg   Acute on chronic systolic (congestive) heart failure (HCC)   1)HFrEF--- BNP is 3,468 up from 1,987,patient with increasing dyspnea, and increasing oxygen requirement,  patient presenting with acute on chronic combined diastolic and systolic dysfunction CHF exacerbation---- last known EF 25% based on echo from2/24/2020 at [facility] (echo done in the setting of A. Fib) , PTA was on torsemide 20 mg twice daily and Toprol-XL 50 mg  .. treat empirically with IV Lasix, increase oxygen via nasal cannula to 4 L/min, monitor daily weight and fluid input and output monitoring as ordered .Marland Kitchen Watch renal function closely...  --- Troponin is borderline at 23 --blood pressure has been soft  2)Persistent Atrial Fibrillation--- previously attempted cardioversion on 12/03/2018, continue Eliquis 5 mg p.o. twice daily for secondary stroke prophylaxis, Toprol XL 50 mg daily for rate control,   3) AKI on chronic kidney disease stage III--- baseline creatinine usually around 1.7, creatinine is now 2.25 (was 2.0 recently) ... Monitor renal function closely with IV diuresis, consider renal consult  4) ischemic cardiomyopathy CAD---   chest pain-free, borderline troponin most likely due to CHF exacerbation as above #1, status post CABG x3 in March 2014, please see #1 above... Last known EF 25% (10/04/18)  5) chronic anemia----patient hemoglobin is 8.6, which is stable on around patient's baseline    6)H/o Prior CVA and HLD--- patient with residual right-sided hemiparesis from prior stroke, continue Eliquis for secondary stroke prophylaxis, continue Lipitor  7) acute on chronic hypoxic respiratory failure secondary to #1 above--- requiring increased oxygen at this time  8) morbid obesity --- this complicates overall care... Lifestyle and dietary modifications discussed  9)DM2-- .  Continue insulin 70/30, check A1c, Use Novolog/Humalog Sliding scale insulin with Accu-Cheks/Fingersticks as ordered  10)Rt leg cellulitis--- leukocytosis of 13.5 noted, no fevers, IV Rocephin as ordered  With History of - Reviewed by me  Past Medical History:  Diagnosis Date   Atrial fibrillation ([employer])    a. diagnosed in 09/2018 --> s/p DCCV in 11/2018 but did not maintain NSR --> rate-control strategy pursued   CAD (coronary artery disease)    a. s/p CABG in 10/2012 at [facility] with LIMA-LAD, SVG-OM, SVG-Diag via Y graft off OM   Carcinoid tumor    CHF (congestive heart  failure) ([place])    a. EF previously 40%, at 50% by imaging in 03/2018, 25% by echo 2020   Colon cancer ([place]) 2008   DM (diabetes mellitus) ([place])    HTN (hypertension)    MI (myocardial infarction) ([place]) FEB 2014   Acute MI of anterior wall   Pulmonary edema    Stroke ([place]) 2010   Residual R weakness      Past Surgical History:  Procedure Laterality Date   BACTERIAL OVERGROWTH TEST N/A 03/18/2015   Procedure: BACTERIAL OVERGROWTH TEST;  Surgeon: Danie Binder, MD;  Location[facility];  Service: Endoscopy;  Laterality: N/A;  0800   CARDIAC CATHETERIZATION  08/2008   CARDIOVERSION N/A 12/03/2018   Procedure: CARDIOVERSION;  Surgeon: Satira Sark, MD;  Location: [facility];  Service: Cardiovascular;  Laterality: N/A;   CHOLECYSTECTOMY     COLONOSCOPY N/A 03/03/2015   SLF: 1. One colon polyp removed 2. Mild diverticulosis in the sigmoid colon 3. Small internal hemorrhoids   CORONARY ARTERY BYPASS GRAFT  10/08/2012   SHOULDER SURGERY     TUBAL LIGATION        Chief Complaint  Patient presents with   Shortness of Breath      HPI:    Erica [name]  is a 68 y.o. female with past medical history of CAD (s/p CABG in 10/2012), carotid artery stenosis, chronic combined systolic and diastolic CHF (EF previously 40%, 50% by repeat imaging in 03/2018, at 25% by echo in 09/2018),persistent atrial fibrillation (diagnosed in 09/2018, s/p DCCV in 11/2018 but did not maintain NSR --> rate-control strategy pursued),HTN, HLD, IDDM,Stage 3 CKD,and prior CVA presents to the ED with complaints of worsening shortness of breath, orthopnea, and increased oxygen requirement  --- No chest pain, no palpitations, no dizziness,  -- Complaints of weeping drainage from both legs right more than left, with increasing edema, no fevers, no chills,  --Patient denies any significant fatigue and dyspnea on exertion  In ED.Marland Kitchen She is found to be more hypoxic than baseline, PTA she uses 3 L of oxygen  In ED----chest x-ray with vascular congestion and bilateral effusions consistent with CHF -Hemoglobin is stable at 8.6, creatinine is up to 2.25, BNP is elevated at 3468 up from 1987 previously,  --WBC 13.5, troponin is 23 and platelet count 183 --Patient received IV Lasix in the ED with some urine output, but blood pressure has been soft     Review of systems:    In addition to the HPI above,   A full Review of  Systems was done, all other systems reviewed are negative except as noted above in HPI , .    Social History:  Reviewed by me    Social History   Tobacco Use   Smoking status: Never Smoker   Smokeless tobacco: Never Used    Substance Use Topics   Alcohol use: No    Alcohol/week: 0.0 standard drinks       Family History :  Reviewed by me    Family History  Problem Relation Age of Onset   Diabetes Mother    Heart disease Mother    Colon cancer Paternal Uncle    Colon polyps Neg Hx     Home Medications:   Prior to Admission medications   Medication Sig Start Date End Date Taking? Authorizing Provider  Amino Acids-Protein Hydrolys (PRO-STAT PO) Take by mouth.    [provider]  atorvastatin (LIPITOR) 20 MG tablet Take 1 tablet (  20 mg total) by mouth daily at 6 PM. 12/05/18   Barton Dubois, MD  cholecalciferol (VITAMIN D) 1000 UNITS tablet Take 1,000 Units by mouth daily.    [provider]  colestipol (COLESTID) 1 g tablet TAKE 1 TABLET BY MOUTH 30 MINUTES BEFORE BREAKFAST AND SUPPER Patient taking differently: Take 1 g by mouth daily.  05/16/16   Carlis Stable, NP  ELIQUIS 5 MG TABS tablet Take 5 mg by mouth 2 (two) times daily. 10/07/18   [provider]  Fluticasone-Salmeterol (ADVAIR DISKUS) 250-50 MCG/DOSE AEPB Inhale 1 puff into the lungs 2 (two) times daily.    [provider]  insulin NPH-regular Human (70-30) 100 UNIT/ML injection Inject 12 Units into the skin 2 (two) times daily with a meal. Patient taking differently: Inject 15 Units into the skin 2 (two) times daily with a meal.  01/07/19   Johnson, Clanford L, MD  Magnesium 400 MG CAPS Take 1 capsule by mouth daily. 11/12/13   Herminio Commons, MD  metoprolol succinate (TOPROL-XL) 50 MG 24 hr tablet Take 1 tablet (50 mg total) by mouth daily. Take with or immediately following a meal. 01/07/19   Johnson, Clanford L, MD  Multiple Vitamin (MULTIVITAMIN) tablet Take 1 tablet by mouth daily.    [provider]  nitroGLYCERIN (NITROSTAT) 0.4 MG SL tablet Place 1 tablet (0.4 mg total) under the tongue every 5 (five) minutes as needed for chest pain. 06/05/17   Herminio Commons, MD  omeprazole  (PRILOSEC) 20 MG capsule TAKE 1 CAPSULE BY MOUTH EVERY DAY Patient taking differently: Take 20 mg by mouth daily.  04/17/16   Mahala Menghini, PA-C  tiZANidine (ZANAFLEX) 4 MG tablet Take 4 mg by mouth at bedtime.     [provider]  torsemide (DEMADEX) 20 MG tablet Take 20 mg daily.May take extra 20 mg tablet if weight is over 2 lbs overnight, or, 5 lbs in one week 01/31/19   Ahmed Prima, [place] M, PA-C  travoprost, benzalkonium, (TRAVATAN) 0.004 % ophthalmic solution Place 1 drop into both eyes at bedtime.    [provider]     Allergies:     Allergies  Allergen Reactions   Bee Venom    Tradjenta [Linagliptin]    Codeine Nausea Only and Other (See Comments)    Other reaction(s): Other (See Comments) headache     Physical Exam:   Vitals  Blood pressure (!) 97/50, pulse 92, temperature 99.1 F (37.3 C), temperature source Oral, resp. rate (!) 24, SpO2 100 %.  Physical Examination: General appearance - alert, chronically ill appearing, and in no distress  Mental status - alert, oriented to person, place, and time, Eyes - sclera anicteric Nose- [place] 4L/min Neck - supple, +ve JVD elevation , Chest -diminished in bases with bibasilar rales  heart - S1 and S2 normal, irregularly irregular Abdomen - soft, nontender, nondistended, no masses or organomegaly Neurological - screening mental status exam normal, neck supple without rigidity, cranial nerves II through XII intact, DTR's normal and symmetric Extremities -2 to 3 +pedal edema noted, there is no weeping/clear liquid drainage from both legs right more than left,  intact peripheral pulses  Skin -right leg/shin area with abrasion/open wounds clear drainage, there is erythema and warmth surrounding this area, no significant streaking    Data Review:    CBC Recent Labs  Lab 02/24/19 0042  WBC 13.5*  HGB 8.6*  HCT 30.6*  PLT 183  MCV 78.9*  MCH 22.2*  MCHC 28.1*  RDW 18.8*  LYMPHSABS 1.3  MONOABS 1.1*    EOSABS 0.0  BASOSABS 0.0   ------------------------------------------------------------------------------------------------------------------  Chemistries  Recent Labs  Lab 02/24/19 0042  NA 137  K 5.1  CL 105  CO2 21*  GLUCOSE 75  BUN 44*  CREATININE 2.25*  CALCIUM 8.8*  AST 26  ALT 15  ALKPHOS 110  BILITOT 1.9*   ------------------------------------------------------------------------------------------------------------------ CrCl cannot be calculated (Unknown ideal weight.). ------------------------------------------------------------------------------------------------------------------ No results for input(s): TSH, T4TOTAL, T3FREE, THYROIDAB in the last 72 hours.  Invalid input(s): FREET3   Coagulation profile No results for input(s): INR, PROTIME in the last 168 hours. ------------------------------------------------------------------------------------------------------------------- No results for input(s): DDIMER in the last 72 hours. -------------------------------------------------------------------------------------------------------------------  Cardiac Enzymes No results for input(s): CKMB, TROPONINI, MYOGLOBIN in the last 168 hours.  Invalid input(s): CK ------------------------------------------------------------------------------------------------------------------    Component Value Date/Time   BNP 3,468.0 (H) 02/24/2019 0042   BNP 1,845 (H) 01/30/2019 1225    Urinalysis    Component Value Date/Time   COLORURINE YELLOW 01/01/2019 0433   APPEARANCEUR HAZY (A) 01/01/2019 0433   LABSPEC 1.009 01/01/2019 0433   PHURINE 5.0 01/01/2019 0433   GLUCOSEU NEGATIVE 01/01/2019 0433   HGBUR LARGE (A) 01/01/2019 0433   BILIRUBINUR NEGATIVE 01/01/2019 0433   KETONESUR NEGATIVE 01/01/2019 0433   PROTEINUR NEGATIVE 01/01/2019 0433   UROBILINOGEN 0.2 11/05/2008 0627   NITRITE NEGATIVE 01/01/2019 0433   LEUKOCYTESUR NEGATIVE 01/01/2019 0433     ----------------------------------------------------------------------------------------------------------------   Imaging Results:    Dg Chest Portable 1 View  Result Date: 02/24/2019 CLINICAL DATA:  68 year old female with shortness of breath. EXAM: PORTABLE CHEST 1 VIEW COMPARISON:  Chest radiograph dated 01/06/2019 FINDINGS: Interval removal of the right IJ central line. There is shallow inspiration with bibasilar atelectasis. Probable bilateral small pleural effusions. No pneumothorax. Stable cardiomegaly with mild vascular congestion. Median sternotomy wires. No acute osseous pathology. IMPRESSION: Cardiomegaly with mild vascular congestion and small bilateral pleural effusions. Electronically Signed   By: Anner Crete M.D.   On: 02/24/2019 01:23    Radiological Exams on Admission: Dg Chest Portable 1 View  Result Date: 02/24/2019 CLINICAL DATA:  68 year old female with shortness of breath. EXAM: PORTABLE CHEST 1 VIEW COMPARISON:  Chest radiograph dated 01/06/2019 FINDINGS: Interval removal of the right IJ central line. There is shallow inspiration with bibasilar atelectasis. Probable bilateral small pleural effusions. No pneumothorax. Stable cardiomegaly with mild vascular congestion. Median sternotomy wires. No acute osseous pathology. IMPRESSION: Cardiomegaly with mild vascular congestion and small bilateral pleural effusions. Electronically Signed   By: Anner Crete M.D.   On: 02/24/2019 01:23    DVT Prophylaxis -SCD /eliquis AM Labs Ordered, also please review Full Orders  Family Communication: Admission, patients condition and plan of care including tests being ordered have been discussed with the patient who indicate understanding and agree with the plan   Code Status - Full Code  Likely DC to  TBD  Condition   stable  Roxan Hockey M.D on 02/24/2019 at 4:58 AM Go to [email or web address] -  for contact info  [facility] - Office  [phone]

## 2019-02-24 NOTE — Care Management Obs Status (Signed)
[place] NOTIFICATION   Patient Details  Name: Erica [name] MRN: [id number] Date of Birth: July 28, 1951   Medicare Observation Status Notification Given:  Yes    [name] 02/24/2019, 2:43 PM

## 2019-02-25 ENCOUNTER — Inpatient Hospital Stay (HOSPITAL_COMMUNITY): Payer: Medicare HMO

## 2019-02-25 DIAGNOSIS — L03115 Cellulitis of right lower limb: Secondary | ICD-10-CM

## 2019-02-25 DIAGNOSIS — J9621 Acute and chronic respiratory failure with hypoxia: Secondary | ICD-10-CM

## 2019-02-25 DIAGNOSIS — I4819 Other persistent atrial fibrillation: Secondary | ICD-10-CM

## 2019-02-25 LAB — BASIC METABOLIC PANEL
Anion gap: 10 (ref 5–15)
BUN: 51 mg/dL — ABNORMAL HIGH (ref 8–23)
CO2: 22 mmol/L (ref 22–32)
Calcium: 8.4 mg/dL — ABNORMAL LOW (ref 8.9–10.3)
Chloride: 103 mmol/L (ref 98–111)
Creatinine, Ser: 2.39 mg/dL — ABNORMAL HIGH (ref 0.44–1.00)
GFR calc Af Amer: 24 mL/min — ABNORMAL LOW (ref >60)
GFR calc non Af Amer: 20 mL/min — ABNORMAL LOW (ref >60)
Glucose, Bld: 188 mg/dL — ABNORMAL HIGH (ref 70–99)
Potassium: 4.8 mmol/L (ref 3.5–5.1)
Sodium: 135 mmol/L (ref 135–145)

## 2019-02-25 LAB — CBC
HCT: 25.3 % — ABNORMAL LOW (ref 36.0–46.0)
Hemoglobin: 7.1 g/dL — ABNORMAL LOW (ref 12.0–15.0)
MCH: 22.1 pg — ABNORMAL LOW (ref 26.0–34.0)
MCHC: 28.1 g/dL — ABNORMAL LOW (ref 30.0–36.0)
MCV: 78.8 fL — ABNORMAL LOW (ref 80.0–100.0)
Platelets: 152 10*3/uL (ref 150–400)
RBC: 3.21 MIL/uL — ABNORMAL LOW (ref 3.87–5.11)
RDW: 18.5 % — ABNORMAL HIGH (ref 11.5–15.5)
WBC: 9 10*3/uL (ref 4.0–10.5)
nRBC: 0 % (ref 0.0–0.2)

## 2019-02-25 LAB — GLUCOSE, CAPILLARY
Glucose-Capillary: 161 mg/dL — ABNORMAL HIGH (ref 70–99)
Glucose-Capillary: 151 mg/dL — ABNORMAL HIGH (ref 70–99)
Glucose-Capillary: 207 mg/dL — ABNORMAL HIGH (ref 70–99)
Glucose-Capillary: 196 mg/dL — ABNORMAL HIGH (ref 70–99)
Glucose-Capillary: 209 mg/dL — ABNORMAL HIGH (ref 70–99)

## 2019-02-25 LAB — OCCULT BLOOD X 1 CARD TO LAB, STOOL: Fecal Occult Bld: NEGATIVE

## 2019-02-25 MED ORDER — HYDROCODONE-ACETAMINOPHEN 5-325 MG PO TABS
2.0000 | ORAL_TABLET | Freq: Once | ORAL | Status: AC
  Administered 2019-02-25: 2 via ORAL
  Filled 2019-02-25: qty 2

## 2019-02-25 MED ORDER — FAMOTIDINE 20 MG PO TABS
20.0000 mg | ORAL_TABLET | Freq: Every day | ORAL | Status: DC
  Administered 2019-02-25 – 2019-02-26 (×2): 20 mg via ORAL
  Filled 2019-02-25 (×3): qty 1

## 2019-02-25 MED ORDER — CALCIUM CARBONATE ANTACID 500 MG PO CHEW
2.0000 | CHEWABLE_TABLET | Freq: Two times a day (BID) | ORAL | Status: DC | PRN
  Administered 2019-02-25: 400 mg via ORAL
  Filled 2019-02-25: qty 2

## 2019-02-25 NOTE — TOC Initial Note (Signed)
Transition of Care [facility]) - Initial/Assessment Note    Patient Details  Name: Erica [name] MRN: [id number] Date of Birth: 04-Nov-1950  [facility] CM/SW Contact:    Boneta Lucks, RN Phone Number: 02/25/2019, 2:35 PM  Clinical Narrative:     Patient admitted for CHF. Patient has high risk readmission rate.  Spoke with Patients daughter Erica [name].  She states he mother lives alone, She and her brother Erica [name] both lives less than 5 minutes away. Erica [name] is disable, but she and her brother are available and supportive for the patient. They provide her transport to all appointments.  Patient has recent stay at [facility]. Patient is active with [facility] for RN, PT, Aide.  They have had some issues with the aide not showing. Erica [name] has  to take her mother to her house for baths. Erica [name] states her home health services was delayed waiting on a negative COVID test.              Expected Discharge Plan: [place] Barriers to Discharge: Continued Medical Work up   Patient Goals and CMS Choice        Expected Discharge Plan and Services Expected Discharge Plan: [place] Acute Care Choice: [place] arrangements for the past 2 months: Single Family Home                  Prior Living Arrangements/Services Living arrangements for the past 2 months: Single Family Home Lives with:: Self              Current home services: Home RN, Home PT    Activities of Daily Living Home Assistive Devices/Equipment: Environmental consultant (specify type), Wheelchair, Shower chair with back, Radio producer (specify quad or straight), Grab bars in shower, Grab bars around toilet ADL Screening (condition at time of admission) Patient's cognitive ability adequate to safely complete daily activities?: Yes Is the patient deaf or have difficulty hearing?: No Does the patient have difficulty seeing, even when wearing glasses/contacts?: No Does the  patient have difficulty concentrating, remembering, or making decisions?: No Patient able to express need for assistance with ADLs?: Yes Does the patient have difficulty dressing or bathing?: Yes Independently performs ADLs?: No Communication: Independent Dressing (OT): Independent Grooming: Independent Feeding: Independent Bathing: Needs assistance Is this a change from baseline?: Pre-admission baseline Toileting: Independent In/Out Bed: Needs assistance Is this a change from baseline?: Pre-admission baseline Walks in Home: Needs assistance Is this a change from baseline?: Pre-admission baseline Does the patient have difficulty walking or climbing stairs?: Yes Weakness of Legs: Right Weakness of Arms/Hands: Right  Permission Sought/Granted   Admission diagnosis:  Acute on chronic combined systolic and diastolic congestive heart failure ([place]) [I50.43] Patient Active Problem List   Diagnosis Date Noted  . Acute on chronic respiratory failure with hypoxia ([place]) 02/24/2019  . CKD (chronic kidney disease) stage 3, GFR 30-59 ml/min (HCC) 02/24/2019  . Acute exacerbation of CHF (congestive heart failure) ([place]) 02/24/2019  . Acute on chronic combined systolic and diastolic congestive heart failure ([place])   . Coronary artery disease of bypass graft of native heart with stable angina pectoris ([place])   . Cellulitis of right leg   . Pleural effusion   . Pulmonary edema   . Systolic congestive heart failure ([place])   . Pressure injury of skin 01/04/2019  . Hypoxia   . Acute renal failure superimposed on stage 3 chronic kidney disease (  HCC)   . Persistent atrial fibrillation   . Acute on chronic systolic (congestive) heart failure ([place]) 11/29/2018  . Type 2 diabetes with nephropathy ([place]) 08/02/2018  . Obesity, Class III, BMI 40-49.9 (morbid obesity) ([place]) 08/02/2018  . GERD (gastroesophageal reflux disease) 04/14/2015  . Diarrhea 12/10/2013  . Carcinoid tumor   . CVA (cerebral vascular  accident) ([place]) 03/07/2013  . CAD/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  . Hyperlipidemia LDL goal <70 04/30/2009  . OVERWEIGHT/OBESITY 04/30/2009  . Essential hypertension 04/30/2009  . CAD, NATIVE VESSEL 04/30/2009  . BRUIT 04/30/2009   PCP:  Curlene Labrum, MD Pharmacy:   [facility], [place] [address] [place] [address] Phone: [phone] Fax: [phone]     Social Determinants of Health (SDOH) Interventions    Readmission Risk Interventions Readmission Risk Prevention Plan 02/25/2019 01/06/2019 01/03/2019  Transportation Screening Complete Complete -  PCP or Specialist Appt within 5-7 Days - - -  PCP or Specialist Appt within 3-5 Days Not Complete - Complete  Home Care Screening - - -  Medication Review (RN CM) - - -  [place] or Home Care Consult Complete - Complete  Social Work Consult for [place] Planning/Counseling Complete - Complete  Palliative Care Screening Not Complete - Not Applicable  Medication Review (RN Care Manager) Complete - Complete  Some recent data might be hidden

## 2019-02-25 NOTE — Progress Notes (Signed)
PROGRESS NOTE  Erica [name] MGQ:[id number] DOB: [birth year] DOA: 02/23/2019 PCP: Curlene Labrum, MD  Brief History:  68 year old female with a history of coronary artery disease with CABG, carotid stenosis, systolic and diastolic CHF, persistent atrial fibrillation status post DCCV 11/2018, hypertension, CKD stage III, diabetes mellitus, chronic respiratory failure on 3 L presenting with 2-day history of worsening shortness of breath, orthopnea, and increasing oxygen requirement.  The patient states that her lower extremity edema has been a chronic problem for her.  Has not been significantly worsened.  She complains of drainage of clear fluid of her left greater than right leg.  She denies any fevers, chills, chest pain, dizziness, nausea, vomiting, diarrhea, abdominal pain, dysuria, hematuria.  The patient has been compliant with all her medications.  She weighs herself on a daily basis.  She stated her last weight was 238.6 pounds on the day prior to admission.  The patient was seen in the cardiology office on 01/30/2019 during which time her weight was 235 pounds.  The patient had a recent mission to the hospital from 01/01/2019 through 01/07/2019 after treatment for bacteremia, cellulitis, and acute on chronic combined CHF.  She was discharged home with torsemide 40 mg daily at that time.  She has since changed her torsemide dosing to 20 mg twice daily.  Upon presentation, the patient had a low-grade temperature 99.1 F with tachycardia 110-120 and soft blood pressures with systolic BP in the low 26Z.  Serum creatinine was noted to be 2.08 with unremarkable LFTs.  WBCs of 13.5.  EKG showed atrial fibrillation with nonspecific T wave changes.  Lactic acid was 1.2 per troponin was 23 >>> 23 chest x-ray showed vascular congestion with small bilateral pleural effusions.  Assessment/Plan: Acute on chronic respiratory failure with hypoxia -Secondary to decompensated CHF -Patient has been weaned  back to her baseline 3 L with pox 100%  Acute on chronic combined systolic and diastolic CHF -1/24 furosemide held due to increase serum creatinine -Cardiology consult appreciated -Continue metoprolol succinate -01/07/2019 discharge weight 248.9 pounds -01/30/19 office weight 235 -02/24/2019 admission weight ? -question weight accuracy (bed weights) -body habitus results in difficult assessing fluid status  Cellulitis RLE -see pictures from 02/24/19 -d/c ceftriaxone -Continue cefazolin  Persistent atrial fibrillation -s/p DCCV in 11/2018 but did not maintain NSR --> rate-control strategy pursued -Continue apixaban -Continue metoprolol succinate -09/30/2018 echo EF 25%, mild dilated RV, HK apical anterior septal -rates ~100-110  CKD stage III -The patient's renal function has been widely variable -Appears that the patient has new renal baseline is 1.7-2.0 -Concerned about the degree of cardiorenal syndrome -Monitor with diuresis -will need to tolerate worsen renal function for improved respiratory/fluid status  Anemia of Chronic Disease -baseline Hgb 7-8 -Hgb trending down due to frequent blood draws -FOBT NEG (02/25/19 and 11/29/18) -check iron studies -check B12, folate  Diabetes mellitus type 2 -01/02/2019 hemoglobin A1c 5.5 -Discontinue 70/30 -NovoLog sliding scale -Monitor CBGs  Hyperlipidemia -Continue statin  Essential hypertension -Blood pressure soft -continuing metoprolol succinate  GERD -pt claims protonix makes heartburn worse -d/c protonix -start famotidine -tums prn heartburn    Disposition Plan:   Home in 2-3 days  Family Communication:   Daughter updated on phone  Consultants:  cardiology  Code Status:  FULL  DVT Prophylaxis:  apixaban   Procedures: As Listed in Progress Note Above  Antibiotics: cefazolin 7/20>>>      Subjective: Pt complains of heartburn.  Feels she is breathing better.  Denies cp, n/v/d, abd  pain f,c  Objective: Vitals:   02/25/19 1145 02/25/19 1200 02/25/19 1300 02/25/19 1400  BP:  92/66 (!) 102/49 103/64  Pulse:  89 94 100  Resp:  (!) 28 (!) 25 (!) 24  Temp: 98.3 F (36.8 C)     TempSrc: Axillary     SpO2:  100% 100% 100%  Weight:      Height:        Intake/Output Summary (Last 24 hours) at 02/25/2019 1533 Last data filed at 02/25/2019 0300 Gross per 24 hour  Intake 53 ml  Output 400 ml  Net -347 ml   Weight change: 8.1 kg Exam:   General:  Pt is alert, follows commands appropriately, not in acute distress  HEENT: No icterus, No thrush, No neck mass, [place]/AT  Cardiovascular: RRR, S1/S2, no rubs, no gallops  Respiratory: bibasilar crackles, no wheeze  Abdomen: Soft/+BS, non tender, non distended, no guarding  Extremities: 2 + LE edema, No lymphangitis, No petechiae, No rashes, no synovitis   Data Reviewed: I have personally reviewed following labs and imaging studies Basic Metabolic Panel: Recent Labs  Lab 02/24/19 0042 02/24/19 0618 02/25/19 0551  NA 137 137 135  K 5.1 4.9 4.8  CL 105 104 103  CO2 21* 25 22  GLUCOSE 75 85 188*  BUN 44* 46* 51*  CREATININE 2.25* 2.28* 2.39*  CALCIUM 8.8* 8.6* 8.4*   Liver Function Tests: Recent Labs  Lab 02/24/19 0042  AST 26  ALT 15  ALKPHOS 110  BILITOT 1.9*  PROT 6.8  ALBUMIN 3.2*   No results for input(s): LIPASE, AMYLASE in the last 168 hours. No results for input(s): AMMONIA in the last 168 hours. Coagulation Profile: No results for input(s): INR, PROTIME in the last 168 hours. CBC: Recent Labs  Lab 02/24/19 0042 02/24/19 0618 02/25/19 0551  WBC 13.5* 12.5* 9.0  NEUTROABS 11.2*  --   --   HGB 8.6* 7.5* 7.1*  HCT 30.6* 26.4* 25.3*  MCV 78.9* 78.1* 78.8*  PLT 183 154 152   Cardiac Enzymes: No results for input(s): CKTOTAL, CKMB, CKMBINDEX, TROPONINI in the last 168 hours. BNP: Invalid input(s): POCBNP CBG: Recent Labs  Lab 02/24/19 1653 02/24/19 2116 02/25/19 0110 02/25/19  0748 02/25/19 1147  GLUCAP 123* 136* 161* 151* 207*   HbA1C: No results for input(s): HGBA1C in the last 72 hours. Urine analysis:    Component Value Date/Time   COLORURINE YELLOW 01/01/2019 0433   APPEARANCEUR HAZY (A) 01/01/2019 0433   LABSPEC 1.009 01/01/2019 0433   PHURINE 5.0 01/01/2019 0433   GLUCOSEU NEGATIVE 01/01/2019 0433   HGBUR LARGE (A) 01/01/2019 0433   BILIRUBINUR NEGATIVE 01/01/2019 0433   KETONESUR NEGATIVE 01/01/2019 0433   PROTEINUR NEGATIVE 01/01/2019 0433   UROBILINOGEN 0.2 11/05/2008 0627   NITRITE NEGATIVE 01/01/2019 0433   LEUKOCYTESUR NEGATIVE 01/01/2019 0433   Sepsis Labs: @LABRCNTIP (procalcitonin:4,lacticidven:4) ) Recent Results (from the past 240 hour(s))  SARS Coronavirus 2 (CEPHEID- Performed in [place] hospital lab), Hosp Order     Status: None   Collection Time: 02/24/19 12:50 AM   Specimen: Nasopharyngeal Swab  Result Value Ref Range Status   SARS Coronavirus 2 NEGATIVE NEGATIVE Final    Comment: (NOTE) If result is NEGATIVE SARS-CoV-2 target nucleic acids are NOT DETECTED. The SARS-CoV-2 RNA is generally detectable in upper and lower  respiratory specimens during the acute phase of infection. The lowest  concentration of SARS-CoV-2 viral copies this assay  can detect is 250  copies / mL. A negative result does not preclude SARS-CoV-2 infection  and should not be used as the sole basis for treatment or other  patient management decisions.  A negative result may occur with  improper specimen collection / handling, submission of specimen other  than nasopharyngeal swab, presence of viral mutation(s) within the  areas targeted by this assay, and inadequate number of viral copies  (<250 copies / mL). A negative result must be combined with clinical  observations, patient history, and epidemiological information. If result is POSITIVE SARS-CoV-2 target nucleic acids are DETECTED. The SARS-CoV-2 RNA is generally detectable in upper and  lower  respiratory specimens dur ing the acute phase of infection.  Positive  results are indicative of active infection with SARS-CoV-2.  Clinical  correlation with patient history and other diagnostic information is  necessary to determine patient infection status.  Positive results do  not rule out bacterial infection or co-infection with other viruses. If result is PRESUMPTIVE POSTIVE SARS-CoV-2 nucleic acids MAY BE PRESENT.   A presumptive positive result was obtained on the submitted specimen  and confirmed on repeat testing.  While 2019 novel coronavirus  (SARS-CoV-2) nucleic acids may be present in the submitted sample  additional confirmatory testing may be necessary for epidemiological  and / or clinical management purposes  to differentiate between  SARS-CoV-2 and other Sarbecovirus currently known to infect humans.  If clinically indicated additional testing with an alternate test  methodology [phone]) is advised. The SARS-CoV-2 RNA is generally  detectable in upper and lower respiratory sp ecimens during the acute  phase of infection. The expected result is Negative. Fact Sheet for Patients:  StrictlyIdeas.no Fact Sheet for Healthcare Providers: BankingDealers.co.za This test is not yet approved or cleared by the [place] FDA and has been authorized for detection and/or diagnosis of SARS-CoV-2 by FDA under an Emergency Use Authorization (EUA).  This EUA will remain in effect (meaning this test can be used) for the duration of the COVID-19 declaration under Section 564(b)(1) of the Act, 21 U.S.C. section 360bbb-3(b)(1), unless the authorization is terminated or revoked sooner. Performed at [facility], [address] [place], [place] [address]   Blood culture (routine x 2)     Status: None (Preliminary result)   Collection Time: 02/24/19  2:24 AM   Specimen: BLOOD LEFT WRIST  Result Value Ref Range Status   Specimen  Description BLOOD LEFT WRIST  Final   Special Requests   Final    BOTTLES DRAWN AEROBIC AND ANAEROBIC Blood Culture results may not be optimal due to an inadequate volume of blood received in culture bottles   Culture   Final    NO GROWTH 1 DAY Performed at [facility], [address] [place], [place] [address]    Report Status PENDING  Incomplete  Blood culture (routine x 2)     Status: None (Preliminary result)   Collection Time: 02/24/19  2:33 AM   Specimen: BLOOD RIGHT HAND  Result Value Ref Range Status   Specimen Description BLOOD RIGHT HAND  Final   Special Requests   Final    BOTTLES DRAWN AEROBIC AND ANAEROBIC Blood Culture adequate volume   Culture   Final    NO GROWTH 1 DAY Performed at [facility], [address], [place], [place] [address]    Report Status PENDING  Incomplete  MRSA PCR Screening     Status: None   Collection Time: 02/24/19  5:22 PM  Specimen: Nasal Mucosa; Nasopharyngeal  Result Value Ref Range Status   MRSA by PCR NEGATIVE NEGATIVE Final    Comment:        The GeneXpert MRSA Assay (FDA approved for NASAL specimens only), is one component of a comprehensive MRSA colonization surveillance program. It is not intended to diagnose MRSA infection nor to guide or monitor treatment for MRSA infections. Performed at [facility], [address] [place], [place] [address]      Scheduled Meds: . apixaban  5 mg Oral BID  . atorvastatin  20 mg Oral q1800  . colestipol  1 g Oral Daily  . famotidine  20 mg Oral Daily  . insulin aspart  0-15 Units Subcutaneous TID WC  . insulin aspart  0-5 Units Subcutaneous QHS  . latanoprost  1 drop Both Eyes QHS  . magnesium oxide  200 mg Oral Daily  . metoprolol succinate  50 mg Oral Daily  . mometasone-formoterol  2 puff Inhalation BID  . multivitamin with minerals  1 tablet Oral Daily  . sodium chloride flush  3 mL Intravenous Q12H  . tiZANidine  4 mg Oral QHS   Continuous Infusions: . sodium chloride     .  ceFAZolin (ANCEF) IV 1 g (02/25/19 1021)    Procedures/Studies: Dg Chest Port 1 View  Result Date: 02/25/2019 CLINICAL DATA:  68 year old with shortness of breath. Followup edema and effusions. EXAM: PORTABLE CHEST 1 VIEW COMPARISON:  02/24/2019 and earlier. FINDINGS: Prior sternotomy. Cardiac silhouette moderately to markedly enlarged, unchanged. Pulmonary venous hypertension and mild interstitial and airspace pulmonary edema, slightly increased since yesterday. Stable BILATERAL pleural effusions, RIGHT greater than LEFT, and associated mild passive atelectasis in the lower lobes. IMPRESSION: 1. Slight worsening of CHF and/or fluid overload, with stable moderate to marked cardiomegaly and mild diffuse interstitial and airspace pulmonary edema. 2. Stable BILATERAL pleural effusions, RIGHT greater than LEFT, and associated mild passive atelectasis in the lower lobes. Electronically Signed   By: Evangeline Dakin M.D.   On: 02/25/2019 14:50   Dg Chest Portable 1 View  Result Date: 02/24/2019 CLINICAL DATA:  68 year old female with shortness of breath. EXAM: PORTABLE CHEST 1 VIEW COMPARISON:  Chest radiograph dated 01/06/2019 FINDINGS: Interval removal of the right IJ central line. There is shallow inspiration with bibasilar atelectasis. Probable bilateral small pleural effusions. No pneumothorax. Stable cardiomegaly with mild vascular congestion. Median sternotomy wires. No acute osseous pathology. IMPRESSION: Cardiomegaly with mild vascular congestion and small bilateral pleural effusions. Electronically Signed   By: Anner Crete M.D.   On: 02/24/2019 01:23    Orson Eva, DO  [facility] Pager [phone]  If 7PM-7AM, please contact night-coverage [email or web address] Password [facility] 02/25/2019, 3:33 PM   LOS: 1 day

## 2019-02-25 NOTE — Progress Notes (Signed)
Patient has BP in 37'C systolic at current time. MAP is at 60 order to hold IV Lasix at this time per Dr. Maudie Mercury.

## 2019-02-25 NOTE — Plan of Care (Signed)

## 2019-02-25 NOTE — Progress Notes (Addendum)
Progress Note  Patient Name: Erica [name] Date of Encounter: 02/25/2019  Primary Cardiologist: Kate Sable, MD   Subjective   Still with orthopnea and dyspnea with minimal activity. No chest pain. Intermittent palpitations overnight and this AM. BP soft after receiving IV Lopressor yesterday evening.   Inpatient Medications    Scheduled Meds: . apixaban  5 mg Oral BID  . atorvastatin  20 mg Oral q1800  . colestipol  1 g Oral Daily  . furosemide  40 mg Intravenous Q12H  . insulin aspart  0-15 Units Subcutaneous TID WC  . insulin aspart  0-5 Units Subcutaneous QHS  . latanoprost  1 drop Both Eyes QHS  . magnesium oxide  200 mg Oral Daily  . metoprolol succinate  50 mg Oral Daily  . mometasone-formoterol  2 puff Inhalation BID  . multivitamin with minerals  1 tablet Oral Daily  . pantoprazole  40 mg Oral Daily  . sodium chloride flush  3 mL Intravenous Q12H  . tiZANidine  4 mg Oral QHS   Continuous Infusions: . sodium chloride    .  ceFAZolin (ANCEF) IV 1 g (02/25/19 1021)   PRN Meds: sodium chloride, acetaminophen **OR** acetaminophen, albuterol, nitroGLYCERIN, ondansetron **OR** ondansetron (ZOFRAN) IV, polyethylene glycol, sodium chloride flush, traZODone   Vital Signs    Vitals:   02/25/19 0700 02/25/19 0746 02/25/19 0800 02/25/19 0900  BP: (!) 89/51  99/67 (!) 92/54  Pulse: 72  97 88  Resp: 17  20 (!) 23  Temp:  98.5 F (36.9 C)    TempSrc:  Oral    SpO2: 100%  100% 100%  Weight:      Height:        Intake/Output Summary (Last 24 hours) at 02/25/2019 1030 Last data filed at 02/25/2019 0300 Gross per 24 hour  Intake 53 ml  Output 400 ml  Net -347 ml    Last 3 Weights 02/25/2019 02/24/2019 01/30/2019  Weight (lbs) 249 lb 1.9 oz 231 lb 4.2 oz 235 lb  Weight (kg) 113 kg 104.9 kg 106.595 kg      Telemetry    Atrial fibrillation, HR in 80's to 110's.  - Personally Reviewed  ECG    Atrial fibrillation, HR 98, with no acute ST changes compared to  prior tracings.    - Personally Reviewed  Physical Exam   General: Well developed, obese female appearing in no acute distress. Head: Normocephalic, atraumatic.  Neck: Supple without bruits, JVD difficult to assess given body habitus. Lungs:  Resp regular and unlabored, CTA. Heart: Irregularly irregular, S1, S2, no S3, S4, or murmur; no rub. Abdomen: Soft, non-tender, non-distended with normoactive bowel sounds. No hepatomegaly. No rebound/guarding. No obvious abdominal masses. Extremities: No clubbing or cyanosis, 2+ pitting edema bilaterally. Distal pedal pulses are 2+ bilaterally. Neuro: Alert and oriented X 3. Moves all extremities spontaneously. Psych: Normal affect.  Labs    Chemistry Recent Labs  Lab 02/24/19 0042 02/24/19 0618 02/25/19 0551  NA 137 137 135  K 5.1 4.9 4.8  CL 105 104 103  CO2 21* 25 22  GLUCOSE 75 85 188*  BUN 44* 46* 51*  CREATININE 2.25* 2.28* 2.39*  CALCIUM 8.8* 8.6* 8.4*  PROT 6.8  --   --   ALBUMIN 3.2*  --   --   AST 26  --   --   ALT 15  --   --   ALKPHOS 110  --   --   BILITOT 1.9*  --   --  GFRNONAA 22* 22* 20*  GFRAA 26* 25* 24*  ANIONGAP 11 8 10      Hematology Recent Labs  Lab 02/24/19 0042 02/24/19 0618 02/25/19 0551  WBC 13.5* 12.5* 9.0  RBC 3.88 3.38* 3.21*  HGB 8.6* 7.5* 7.1*  HCT 30.6* 26.4* 25.3*  MCV 78.9* 78.1* 78.8*  MCH 22.2* 22.2* 22.1*  MCHC 28.1* 28.4* 28.1*  RDW 18.8* 18.8* 18.5*  PLT 183 154 152    Cardiac EnzymesNo results for input(s): TROPONINI in the last 168 hours. No results for input(s): TROPIPOC in the last 168 hours.   BNP Recent Labs  Lab 02/24/19 0042  BNP 3,468.0*     DDimer No results for input(s): DDIMER in the last 168 hours.   Radiology    Dg Chest Portable 1 View  Result Date: 02/24/2019 CLINICAL DATA:  68 year old female with shortness of breath. EXAM: PORTABLE CHEST 1 VIEW COMPARISON:  Chest radiograph dated 01/06/2019 FINDINGS: Interval removal of the right IJ central line.  There is shallow inspiration with bibasilar atelectasis. Probable bilateral small pleural effusions. No pneumothorax. Stable cardiomegaly with mild vascular congestion. Median sternotomy wires. No acute osseous pathology. IMPRESSION: Cardiomegaly with mild vascular congestion and small bilateral pleural effusions. Electronically Signed   By: Anner Crete M.D.   On: 02/24/2019 01:23    Cardiac Studies   Echocardiogram: 02/24/2019 IMPRESSIONS   1. The left ventricle has severely reduced systolic function, with an ejection fraction of 25-30%. The cavity size was normal. Left ventricular diastolic Doppler parameters are indeterminate.  2. The anteroseptum and inferoseptal walls are akinetic.  3. The right ventricle has moderately reduced systolic function. The cavity was severely enlarged. There is no increase in right ventricular wall thickness.  4. Left atrial size was moderately dilated.  5. Right atrial size was moderately dilated.  6. Mitral valve regurgitation MR is moderate, possibly underestimatd by eccentricity of jet.  7. The tricuspid valve is not well visualized. Tricuspid valve regurgitation is moderate.  8. The aortic valve has an indeterminate number of cusps. No stenosis of the aortic valve.  9. The aortic root is normal in size and structure. 10. Pulmonary hypertension is moderately elevated, PASP is 53 mmHg. 11. The inferior vena cava was dilated in size with <50% respiratory variability. 12. The interatrial septum was not well visualized.  Patient Profile     68 y.o. female w/ PMH of CAD (s/p CABG in 10/2012), carotid artery stenosis, chronic combined systolic and diastolic CHF (EF previously 40%, 50% in 2019, and at 25% in 09/2018), persistent atrial fibrillation (diagnosed in 09/2018, s/p DCCV in 11/2018 but did not maintain NSR), HTN, HLD, and Stage 3 CKD, and prior CVA who is currently admitted for a CHF exacerbation.   Assessment & Plan    1. Acute on Chronic  Combined Systolic and Diastolic CHF Exacerbation - presented with worsening dyspnea at rest and with activity, found to have an elevated BNP of 3468. - she has been receiving IV Lasix 40mg  BID with minimal recorded output of -347 mL and variable weights have been recorded. Reports this was in the 230's when checked at home, at 249 lbs today. Required IV Lasix 80mg  BID during prior admissions and once BP improves, would consider further titration of this. Low threshold for Nephrology consult if renal function continues to worsen.  - continue Toprol-XL (AM dose held given soft BP). Not on ACE-I/ARB/ARNI due to variable renal function.   2. CAD - s/p CABG in 2004. Initial and delta HS  Troponin values have been flat at 23.00. Repeat echo shows a similar EF of 25-30% with WMA as described above.  - remains on statin and BB therapy. Not on ASA given the need for anticoagulation.   3. Permanent Atrial Fibrillation - rates have been variable and she required administration of IV Lopressor yesterday evening. With SBP currently in the 70's, would hold AM Toprol-XL. Not a good candidate for Digoxin given renal function or Amiodarone given her respiratory status.  - she denies any evidence of active bleeding. Hgb has declined, at 7.1 this AM. Continue Eliquis 5mg  BID for anticoagulation.   4. Stage 3 CKD - baseline creatinine 1.7 - 1.8. Elevated to 2.25 on admission, at 2.39 today. Had undergone lab work per Dr. Lowanda Foster as an outpatient but never had a follow-up visit. Interested in switching to a different Nephrologist. If needing Nephrology involvement this admission, would recommend switching to [facility] for continuity of care. If not, perhaps can see Dr. Theador Hawthorne as an outpatient for she lives in [place].   5. Anemia - Hgb was 8.6 on admission, at 7.1 today. She denies any evidence of active bleeding. Would recommend checking stool card as this is likely exacerbating her tachycardia.   For  questions or updates, please contact [place] Please consult [email or web address] for contact info under Cardiology/STEMI.   Signed, Erma Heritage , PA-C 10:30 AM 02/25/2019 Pager: [phone]  Attending note Patient seen and discussed with PA Ahmed Prima, I agree with her documentation. Admitted with acute on chronic systolic HF. Poorly documented I/Os, she has been on lasix 40mg  IV bid, dosing limited by soft bp/s. Reported weigh 113 kg, reported weight yesterday 105 kg, unfortunately her weights are appear inaccurate. Mild uptrend in Cr/BUN and soft bp's suggesting she is dyring out, baseline if 1.7 to 1.8. Exam limited due to body habitus. HOld lasix today. Will repeat CXR to see if pulm edema has improved, repeat BNP tomorrow AM. Sats 100% on 3L [place] which is her baseline requirement. She reports he breathing is back to baseline.  Repeated echo shows LVEF 25-30% (stable), moderate RV dysfunction, mod MR, PASP 53. Persistent afib rate controlled on eliquis.   Difficult to gauge HF management due to incomplete I/Os and poor weight data, difficult exam due to body habitus. F/u CXR and repeat BNP.   Anemia per primary team   Carlyle Dolly MD

## 2019-02-25 NOTE — Progress Notes (Signed)
Patient bp currently 88 systolic patient is sweaty. Wakes up and follows commands easily. Notified Dr. Maudie Mercury, orders to monitor for now.

## 2019-02-26 LAB — BRAIN NATRIURETIC PEPTIDE: B Natriuretic Peptide: 1925 pg/mL — ABNORMAL HIGH (ref 0.0–100.0)

## 2019-02-26 LAB — CBC
HCT: 26.4 % — ABNORMAL LOW (ref 36.0–46.0)
Hemoglobin: 7.5 g/dL — ABNORMAL LOW (ref 12.0–15.0)
MCH: 22.1 pg — ABNORMAL LOW (ref 26.0–34.0)
MCHC: 28.4 g/dL — ABNORMAL LOW (ref 30.0–36.0)
MCV: 77.6 fL — ABNORMAL LOW (ref 80.0–100.0)
Platelets: 154 10*3/uL (ref 150–400)
RBC: 3.4 MIL/uL — ABNORMAL LOW (ref 3.87–5.11)
RDW: 18.6 % — ABNORMAL HIGH (ref 11.5–15.5)
WBC: 6.7 10*3/uL (ref 4.0–10.5)
nRBC: 0 % (ref 0.0–0.2)

## 2019-02-26 LAB — BASIC METABOLIC PANEL
Anion gap: 9 (ref 5–15)
BUN: 62 mg/dL — ABNORMAL HIGH (ref 8–23)
CO2: 23 mmol/L (ref 22–32)
Calcium: 8.5 mg/dL — ABNORMAL LOW (ref 8.9–10.3)
Chloride: 101 mmol/L (ref 98–111)
Creatinine, Ser: 2.45 mg/dL — ABNORMAL HIGH (ref 0.44–1.00)
GFR calc Af Amer: 23 mL/min — ABNORMAL LOW (ref >60)
GFR calc non Af Amer: 20 mL/min — ABNORMAL LOW (ref >60)
Glucose, Bld: 199 mg/dL — ABNORMAL HIGH (ref 70–99)
Potassium: 4.8 mmol/L (ref 3.5–5.1)
Sodium: 133 mmol/L — ABNORMAL LOW (ref 135–145)

## 2019-02-26 LAB — GLUCOSE, CAPILLARY
Glucose-Capillary: 169 mg/dL — ABNORMAL HIGH (ref 70–99)
Glucose-Capillary: 209 mg/dL — ABNORMAL HIGH (ref 70–99)
Glucose-Capillary: 224 mg/dL — ABNORMAL HIGH (ref 70–99)
Glucose-Capillary: 222 mg/dL — ABNORMAL HIGH (ref 70–99)

## 2019-02-26 LAB — FOLATE: Folate: 10.5 ng/mL (ref >5.9)

## 2019-02-26 LAB — IRON AND TIBC
Iron: 7 ug/dL — ABNORMAL LOW (ref 28–170)
Saturation Ratios: 2 % — ABNORMAL LOW (ref 10.4–31.8)
TIBC: 309 ug/dL (ref 250–450)
UIBC: 302 ug/dL

## 2019-02-26 LAB — FERRITIN: Ferritin: 66 ng/mL (ref 11–307)

## 2019-02-26 LAB — VITAMIN B12: Vitamin B-12: 336 pg/mL (ref 180–914)

## 2019-02-26 MED ORDER — NON FORMULARY: 40.0000 mg | Freq: Every day | Status: DC

## 2019-02-26 MED ORDER — FERROUS SULFATE 325 (65 FE) MG PO TABS
325.0000 mg | ORAL_TABLET | Freq: Every day | ORAL | Status: DC
  Administered 2019-02-27: 325 mg via ORAL
  Filled 2019-02-26: qty 1

## 2019-02-26 MED ORDER — OMEPRAZOLE 20 MG PO CPDR
40.0000 mg | DELAYED_RELEASE_CAPSULE | Freq: Every day | ORAL | Status: DC
  Administered 2019-02-26: 40 mg via ORAL

## 2019-02-26 NOTE — TOC Transition Note (Signed)
Transition of Care [facility]) - CM/SW Discharge Note   Patient Details  Name: Erica [name] MRN: [id number] Date of Birth: 04-27-1951  Transition of Care [facility]) CM/SW Contact:  Ihor Gully, LCSW Phone Number: 02/26/2019, 4:07 PM   Clinical Narrative:    PT evaluation recommendations reviewed with patient. Patient is not interested in going back to SNF as she did not have a pleasant experience. Patient states that she plans on going home resuming [facility] with RN, PT, aide. Attending made aware.      Barriers to Discharge: Continued Medical Work up   Patient Goals and CMS Choice        Discharge Placement                       Discharge Plan and Services     Post Acute Care Choice: Home Health                               Social Determinants of Health (SDOH) Interventions     Readmission Risk Interventions Readmission Risk Prevention Plan 02/25/2019 01/06/2019 01/03/2019  Transportation Screening Complete Complete -  PCP or Specialist Appt within 5-7 Days - - -  PCP or Specialist Appt within 3-5 Days Not Complete - Complete  Home Care Screening - - -  Medication Review (RN CM) - - -  [place] or Home Care Consult Complete - Complete  Social Work Consult for [place] Planning/Counseling Complete - Complete  Palliative Care Screening Not Complete - Not Applicable  Medication Review [employer]) Complete - Complete  Some recent data might be hidden

## 2019-02-26 NOTE — Plan of Care (Signed)
  Problem: Acute Rehab PT Goals(only PT should resolve) Goal: Pt Will Go Supine/Side To Sit Outcome: Progressing Flowsheets (Taken 02/26/2019 1559) Pt will go Supine/Side to Sit: with supervision Goal: Patient Will Transfer Sit To/From Stand Outcome: Progressing Flowsheets (Taken 02/26/2019 1559) Patient will transfer sit to/from stand: with min guard assist Goal: Pt Will Transfer Bed To Chair/Chair To Bed Outcome: Progressing [place] (Taken 02/26/2019 1559) Pt will Transfer Bed to Chair/Chair to Bed: with supervision Goal: Pt Will Ambulate Outcome: Progressing Flowsheets (Taken 02/26/2019 1559) Pt will Ambulate:  25 feet  with minimal assist Note: Hemi-walker   4:00 PM, 02/26/19 Lonell Grandchild, MPT Physical Therapist with [facility] 336 [phone] office [phone] mobile phone

## 2019-02-26 NOTE — Care Management Important Message (Signed)
Important Message  Patient Details  Name: Erica [name] MRN: [id number] Date of Birth: 1950/11/01   Medicare Important Message Given:  Yes     [name] 02/26/2019, 1:47 PM

## 2019-02-26 NOTE — Progress Notes (Addendum)
Progress Note  Patient Name: Erica [name] Date of Encounter: 02/26/2019  Primary Cardiologist: Kate Sable, MD   Subjective   Feels like her breathing is back to baseline. On 3L [place]. Having acid reflux this AM after consuming a tomato at breakfast. Takes Omeprazole at home and reports prior intolerances to Protonix which is on the hospital formulary.   Inpatient Medications    Scheduled Meds:  apixaban  5 mg Oral BID   atorvastatin  20 mg Oral q1800   colestipol  1 g Oral Daily   famotidine  20 mg Oral Daily   insulin aspart  0-15 Units Subcutaneous TID WC   insulin aspart  0-5 Units Subcutaneous QHS   latanoprost  1 drop Both Eyes QHS   magnesium oxide  200 mg Oral Daily   metoprolol succinate  50 mg Oral Daily   mometasone-formoterol  2 puff Inhalation BID   multivitamin with minerals  1 tablet Oral Daily   sodium chloride flush  3 mL Intravenous Q12H   tiZANidine  4 mg Oral QHS   Continuous Infusions:  sodium chloride      ceFAZolin (ANCEF) IV Stopped (02/25/19 2205)   PRN Meds: sodium chloride, acetaminophen **OR** acetaminophen, albuterol, calcium carbonate, nitroGLYCERIN, ondansetron **OR** ondansetron (ZOFRAN) IV, polyethylene glycol, sodium chloride flush, traZODone   Vital Signs    Vitals:   02/26/19 0700 02/26/19 0749 02/26/19 0800 02/26/19 0914  BP:   (!) 101/49 (!) 94/54  Pulse: 77  89 84  Resp: 17  17 20   Temp:  97.9 F (36.6 C)    TempSrc:  Oral    SpO2: 100%  100% 100%  Weight:      Height:        Intake/Output Summary (Last 24 hours) at 02/26/2019 0938 Last data filed at 02/26/2019 0500 Gross per 24 hour  Intake --  Output 75 ml  Net -75 ml    Last 3 Weights 02/26/2019 02/25/2019 02/24/2019  Weight (lbs) 250 lb 3.6 oz 249 lb 1.9 oz 231 lb 4.2 oz  Weight (kg) 113.5 kg 113 kg 104.9 kg      Telemetry    Atrial fibrillation, HR in 80's to 90's.  - Personally Reviewed  ECG    No new tracings.   Physical Exam    General: Well developed, obese female appearing in no acute distress. Head: Normocephalic, atraumatic.  Neck: Supple without bruits, JVD difficult to assess given . Lungs:  Resp regular and unlabored, decreased along bases bilaterally. Heart: Irregularly irregular, S1, S2, no S3, S4, or murmur; no rub. Abdomen: Soft, non-tender, non-distended with normoactive bowel sounds. No hepatomegaly. No rebound/guarding. No obvious abdominal masses. Extremities: No clubbing or cyanosis, dressing in place but edema improved when compared to prior examinations. Distal pedal pulses are 2+ bilaterally. Neuro: Alert and oriented X 3. Moves all extremities spontaneously. Psych: Normal affect.  Labs    Chemistry Recent Labs  Lab 02/24/19 0042 02/24/19 0618 02/25/19 0551 02/26/19 0441  NA 137 137 135 133*  K 5.1 4.9 4.8 4.8  CL 105 104 103 101  CO2 21* 25 22 23   GLUCOSE 75 85 188* 199*  BUN 44* 46* 51* 62*  CREATININE 2.25* 2.28* 2.39* 2.45*  CALCIUM 8.8* 8.6* 8.4* 8.5*  PROT 6.8  --   --   --   ALBUMIN 3.2*  --   --   --   AST 26  --   --   --   ALT 15  --   --   --  ALKPHOS 110  --   --   --   BILITOT 1.9*  --   --   --   GFRNONAA 22* 22* 20* 20*  GFRAA 26* 25* 24* 23*  ANIONGAP 11 8 10 9      Hematology Recent Labs  Lab 02/24/19 0618 02/25/19 0551 02/26/19 0441  WBC 12.5* 9.0 6.7  RBC 3.38* 3.21* 3.40*  HGB 7.5* 7.1* 7.5*  HCT 26.4* 25.3* 26.4*  MCV 78.1* 78.8* 77.6*  MCH 22.2* 22.1* 22.1*  MCHC 28.4* 28.1* 28.4*  RDW 18.8* 18.5* 18.6*  PLT 154 152 154    Cardiac EnzymesNo results for input(s): TROPONINI in the last 168 hours. No results for input(s): TROPIPOC in the last 168 hours.   BNP Recent Labs  Lab 02/24/19 0042 02/26/19 0441  BNP 3,468.0* 1,925.0*     DDimer No results for input(s): DDIMER in the last 168 hours.   Radiology    Dg Chest Port 1 View  Result Date: 02/25/2019 CLINICAL DATA:  68 year old with shortness of breath. Followup edema and  effusions. EXAM: PORTABLE CHEST 1 VIEW COMPARISON:  02/24/2019 and earlier. FINDINGS: Prior sternotomy. Cardiac silhouette moderately to markedly enlarged, unchanged. Pulmonary venous hypertension and mild interstitial and airspace pulmonary edema, slightly increased since yesterday. Stable BILATERAL pleural effusions, RIGHT greater than LEFT, and associated mild passive atelectasis in the lower lobes. IMPRESSION: 1. Slight worsening of CHF and/or fluid overload, with stable moderate to marked cardiomegaly and mild diffuse interstitial and airspace pulmonary edema. 2. Stable BILATERAL pleural effusions, RIGHT greater than LEFT, and associated mild passive atelectasis in the lower lobes. Electronically Signed   By: Evangeline Dakin M.D.   On: 02/25/2019 14:50    Cardiac Studies   Echocardiogram: 02/24/2019 IMPRESSIONS   1. The left ventricle has severely reduced systolic function, with an ejection fraction of 25-30%. The cavity size was normal. Left ventricular diastolic Doppler parameters are indeterminate.  2. The anteroseptum and inferoseptal walls are akinetic.  3. The right ventricle has moderately reduced systolic function. The cavity was severely enlarged. There is no increase in right ventricular wall thickness.  4. Left atrial size was moderately dilated.  5. Right atrial size was moderately dilated.  6. Mitral valve regurgitation MR is moderate, possibly underestimatd by eccentricity of jet.  7. The tricuspid valve is not well visualized. Tricuspid valve regurgitation is moderate.  8. The aortic valve has an indeterminate number of cusps. No stenosis of the aortic valve.  9. The aortic root is normal in size and structure. 10. Pulmonary hypertension is moderately elevated, PASP is 53 mmHg. 11. The inferior vena cava was dilated in size with <50% respiratory variability. 12. The interatrial septum was not well visualized.   Patient Profile     68 y.o. female w/ PMH of CAD (s/p CABG  in 10/2012), carotid artery stenosis, chronic combined systolic and diastolic CHF (EF previously 40%, 50% in 2019, and at 25% in 09/2018), persistent atrial fibrillation (diagnosed in 09/2018, s/p DCCV in 11/2018 but did not maintain NSR), HTN, HLD, and Stage 3 CKD, and prior CVA who is currently admitted for a CHF exacerbation.  Assessment & Plan    1. Acute on Chronic Combined Systolic and Diastolic CHF Exacerbation - presented with worsening dyspnea at rest and with activity, found to have an elevated BNP of 3468. Was started on IV Lasix 40mg  BID on admission but held yesterday given rising creatinine. BNP this AM has improved and is now at 1925 yet CXR shows interstitial  and airspace pulmonary edema with stable bilateral pleural effusion. Weight was 235 lbs at the time of her office visit on 6/25 (had been 248 lbs at discharge on 01/07/2019) and has been recorded at 249 - 250 lbs this admission which is similar to what she was obtaining at home (had trended upwards in the 240's per her report). She had just been discharged from SNF at the time her weight was 235 lbs so question if dietary changes since returning home have played a factor as well.  - Will check a standing weight today when she is evaluated by PT as she has not yet been out of bed. Confusing picture as clinically she is feeling much improved but weights would suggest she is still above her previous dry weight. Have not yet ordered AM diuretic dose given rising creatine. Was on Torsemide 20mg  daily PTA.  - continue Toprol-XL. Not on ACE-I/ARB/ARNI due to variable renal function.   2. CAD - s/p CABG in 2004. Initial and delta HS Troponin values have been flat at 23.00. Repeat echo shows a similar EF of 25-30% with WMA as described above.  - Not on ASA given the need for Eliquis. Continue statin and BB therapy.   3. Permanent Atrial Fibrillation - BP soft yesterday following administration of IV Lopresor but has since improved with SBP  in the 90's. HR has been in the 80's to 90's overnight and this AM. Continue Toprol-XL 50mg  daily.  - remains on Eliquis for anticoagulation.   4. Stage 3 CKD - baseline creatinine 1.7 - 1.8. Elevated to 2.25 on admission and trending up to 2.39 and 2.45. IV Lasix held yesterday given increase. Had undergone lab work per Dr. Lowanda Foster as an outpatient but never had a follow-up visit. This needs to be arranged prior to discharge. Patient also in agreement to travel to [place] to be evaluated at [facility] if the wait here is too long.   5. Anemia - Hgb was 8.6 on admission, at 7.1 on 7/21 and 7.5 today. No evidence of active bleeding and occult blood negative. Iron is down to 7.0 with ferritin in a low-normal range of 66. Would recommend the use of iron supplementation as an outpatient.   For questions or updates, please contact [place] Please consult [email or web address] for contact info under Cardiology/STEMI.   Arna Medici , PA-C 9:38 AM   02/26/2019 Pager: [phone]  Attending note Patient seen and discussed with PA Ahmed Prima, I agree with her documentation above. Admitted with acute on chronic systolic HF, poorly documented I/Os and weights have made her management difficult. Diuretics stopped yesterday, her breathing had returned to her [place]leine per her report and she was 100% on her normal 3L [place]. CXR actually showed slight worsening of HF. Uptrend in Cr/BUN would suggest she is drying out, and BNP down from 3500 to 1900 (prior value 12/2018). Difficulty with standing but will try for standing weight. Physical exam limited by body habitus  Essentially very difficult to interpret volume status from standard means as reported above, she does feel better and back to her baseline O2 requirement. With rising Cr would hold diuretics today, follow trend. F/u standing weight.    Carlyle Dolly MD

## 2019-02-26 NOTE — Progress Notes (Signed)
Inpatient Diabetes Program Recommendations  AACE/ADA: New Consensus Statement on Inpatient Glycemic Control   Target Ranges:  Prepandial:   less than 140 mg/dL      Peak postprandial:   less than 180 mg/dL (1-2 hours)      Critically ill patients:  140 - 180 mg/dL   Results for JAYLYNN, MCALEER (MRN [id number]) as of 02/26/2019 08:09  Ref. Range 02/25/2019 07:48 02/25/2019 11:47 02/25/2019 16:35 02/25/2019 21:32 02/26/2019 08:05  Glucose-Capillary Latest Ref Range: 70 - 99 mg/dL 151 (H) 207 (H) 196 (H) 209 (H) 169 (H)   Review of Glycemic Control  Diabetes history: DM2 Outpatient Diabetes medications: 70/30 15 units BID Current orders for Inpatient glycemic control: Novolog 0-15 units TID with meals, Novolog 0-5 units QHS  Inpatient Diabetes Program Recommendations:   Insulin - Meal Coverage: Please consider ordering Novolog 3 units TID with meals for meal coverage if patient eats at least 50% of meals.  Thanks, Barnie Alderman, RN, MSN, CDE Diabetes Coordinator Inpatient Diabetes Program [phone] (Team Pager from 8am to 5pm)

## 2019-02-26 NOTE — Evaluation (Signed)
Physical Therapy Evaluation Patient Details Name: Erica [name] MRN: [id number] DOB: Dec 26, 1950 Today's Date: 02/26/2019   History of Present Illness  Erica [name]  is a 68 y.o. female with past medical history of CAD (s/p CABG in 10/2012), carotid artery stenosis, chronic combined systolic and diastolic CHF (EF previously 40%, 50% by repeat imaging in 03/2018, at 25% by echo in 09/2018), persistent atrial fibrillation (diagnosed in 09/2018, s/p DCCV in 11/2018 but did not maintain NSR --> rate-control strategy pursued), HTN, HLD, IDDM, Stage 3 CKD, and prior CVA presents to the ED with complaints of worsening shortness of breath, orthopnea, and increased oxygen requirement    Clinical Impression  Patient demonstrates slow labored movement sitting up at bedside and SOB once sitting requiring 3-4 minutes to recover.  Patient able to stand up on weight scale, but had to have bed raised due to BLE weakness, limited to 5-6 slow labored steps at bedside due to generalized weakness and SOB.  Patient tolerated sitting up in chair after therapy - nursing staff aware.  Patient will benefit from continued physical therapy in hospital and recommended venue below to increase strength, balance, endurance for safe ADLs and gait.    Follow Up Recommendations SNF;Supervision/Assistance - 24 hour;Supervision for mobility/OOB    Equipment Recommendations  None recommended by PT    Recommendations for Other Services       Precautions / Restrictions Precautions Precautions: Fall Required Braces or Orthoses: Other Brace Other Brace: AFO RLE Restrictions Weight Bearing Restrictions: No      Mobility  Bed Mobility Overal bed mobility: Needs Assistance Bed Mobility: Supine to Sit     Supine to sit: Min assist;Mod assist     General bed mobility comments: slow labored movement  Transfers Overall transfer level: Needs assistance Equipment used: Hemi-walker Transfers: Sit to/from [employer] Sit to Stand: Min assist;Mod assist Stand pivot transfers: Min assist;Mod assist       General transfer comment: request to have bed raised in order to complete sit to stands  Ambulation/Gait Ambulation/Gait assistance: Min assist;Mod assist Gait Distance (Feet): 5 Feet Assistive device: Rolling walker (2 wheeled) Gait Pattern/deviations: Decreased step length - right;Decreased stance time - right;Decreased stride length Gait velocity: decreased   General Gait Details: limted to 5-6 slow unsteady steps with difficulty advancing RLE due to weakness, limited mostly due to c/o fatigue/weakness  Stairs            Wheelchair Mobility    Modified Rankin (Stroke Patients Only)       Balance Overall balance assessment: Needs assistance Sitting-balance support: Feet supported;No upper extremity supported Sitting balance-Leahy Scale: Fair     Standing balance support: Single extremity supported;During functional activity Standing balance-Leahy Scale: Fair Standing balance comment: fair/poor using Hemi-walker                             Pertinent Vitals/Pain Pain Assessment: No/denies pain    Home Living Family/patient expects to be discharged to:: Private residence   Available Help at Discharge: Family;Available PRN/intermittently Type of Home: Mobile home Home Access: Stairs to enter Entrance Stairs-Rails: None Entrance Stairs-Number of Steps: 3 Home Layout: One level Home Equipment: Cane - single point;Other (comment) Additional Comments: hemi-walker, right AFO    Prior Function Level of Independence: Independent with assistive device(s)         Comments: Household and short community distanced ambulator with SPC, drives     Hand Dominance  Dominant Hand: Left    Extremity/Trunk Assessment   Upper Extremity Assessment Upper Extremity Assessment: RUE deficits/detail;LUE deficits/detail RUE Deficits / Details: grossly -2/5, mostly  nonfunctional RUE Sensation: decreased proprioception RUE Coordination: decreased fine motor;decreased gross motor LUE Deficits / Details: grossly 4/5    Lower Extremity Assessment Lower Extremity Assessment: RLE deficits/detail;LLE deficits/detail RLE Deficits / Details: grossly 3/5 RLE Sensation: decreased proprioception RLE Coordination: decreased gross motor LLE Deficits / Details: grossly 4/5    Cervical / Trunk Assessment Cervical / Trunk Assessment: Kyphotic  Communication   Communication: No difficulties  Cognition Arousal/Alertness: Awake/alert Behavior During Therapy: WFL for tasks assessed/performed Overall Cognitive Status: Within Functional Limits for tasks assessed                                        General Comments      Exercises     Assessment/Plan    PT Assessment Patient needs continued PT services  PT Problem List Decreased strength;Decreased activity tolerance;Decreased balance;Decreased mobility       PT Treatment Interventions Balance training;Gait training;Stair training;Functional mobility training;Therapeutic activities;Patient/family education    PT Goals (Current goals can be found in the Care Plan section)  Acute Rehab PT Goals Patient Stated Goal: return home with family to assist PT Goal Formulation: With patient Time For Goal Achievement: 03/12/19 Potential to Achieve Goals: Good    Frequency Min 3X/week   Barriers to discharge        Co-evaluation               AM-PAC PT "6 Clicks" Mobility  Outcome Measure Help needed turning from your back to your side while in a flat bed without using bedrails?: A Little Help needed moving from lying on your back to sitting on the side of a flat bed without using bedrails?: A Lot Help needed moving to and from a bed to a chair (including a wheelchair)?: A Lot Help needed standing up from a chair using your arms (e.g., wheelchair or bedside chair)?: A Little Help  needed to walk in hospital room?: A Lot Help needed climbing 3-5 steps with a railing? : Total 6 Click Score: 13    End of Session Equipment Utilized During Treatment: Oxygen Activity Tolerance: Patient tolerated treatment well;Patient limited by fatigue Patient left: in chair;with call bell/phone within reach Nurse Communication: Mobility status PT Visit Diagnosis: Unsteadiness on feet (R26.81);Other abnormalities of gait and mobility (R26.89);Muscle weakness (generalized) (M62.81);Hemiplegia and hemiparesis Hemiplegia - Right/Left: Right Hemiplegia - dominant/non-dominant: Non-dominant Hemiplegia - caused by: Cerebral infarction    Time: 8416-6063 PT Time Calculation (min) (ACUTE ONLY): 27 min   Charges:   PT Evaluation $PT Eval Moderate Complexity: 1 Mod PT Treatments $Therapeutic Activity: 23-37 mins        3:58 PM, 02/26/19 Lonell Grandchild, MPT Physical Therapist with [facility] 336 [phone] office [phone] mobile phone

## 2019-02-26 NOTE — Progress Notes (Signed)
PROGRESS NOTE  Erica [name] NAT:[id number] DOB: [birth year] DOA: 02/23/2019 PCP: Curlene Labrum, MD  Brief History:  68 year old female with a history of coronary artery disease with CABG, carotid stenosis, systolic and diastolic CHF, persistent atrial fibrillation status post DCCV 11/2018, hypertension, CKD stage III, diabetes mellitus, chronic respiratory failure on 3 L presenting with 2-day history of worsening shortness of breath, orthopnea, and increasing oxygen requirement.  The patient states that her lower extremity edema has been a chronic problem for her.  Has not been significantly worsened.  She complains of drainage of clear fluid of her left greater than right leg.  She denies any fevers, chills, chest pain, dizziness, nausea, vomiting, diarrhea, abdominal pain, dysuria, hematuria.  The patient has been compliant with all her medications.  She weighs herself on a daily basis.  She stated her last weight was 238.6 pounds on the day prior to admission.  The patient was seen in the cardiology office on 01/30/2019 during which time her weight was 235 pounds.  The patient had a recent mission to the hospital from 01/01/2019 through 01/07/2019 after treatment for bacteremia, cellulitis, and acute on chronic combined CHF.  She was discharged home with torsemide 40 mg daily at that time.  She has since changed her torsemide dosing to 20 mg twice daily.  Upon presentation, the patient had a low-grade temperature 99.1 F with tachycardia 110-120 and soft blood pressures with systolic BP in the low 42H.  Serum creatinine was noted to be 2.08 with unremarkable LFTs.  WBCs of 13.5.  EKG showed atrial fibrillation with nonspecific T wave changes.  Lactic acid was 1.2 per troponin was 23 >>> 23 chest x-ray showed vascular congestion with small bilateral pleural effusions.  Assessment/Plan: Acute on chronic respiratory failure with hypoxia -Secondary to decompensated CHF -Patient has been weaned  back to her baseline 3 L with pox 100%  Acute on chronic combined systolic and diastolic CHF -0/62 furosemide held due to increase serum creatinine -Cardiology consult appreciated -Continue metoprolol succinate -01/07/2019 discharge weight 248.9 pounds -01/30/19 office weight 235 -02/24/2019 admission weight ? -question weight accuracy (bed weights) -body habitus results in difficult assessing fluid status -Continue to monitor off Lasix for now. BNP improved. Cr worsen   Cellulitis RLE -see pictures from 02/24/19 -d/c ceftriaxone -Continue cefazolin; with intentions to transition to oral regimen in next 24 hours.  Persistent atrial fibrillation -s/p DCCV in 11/2018 but did not maintain NSR --> rate-control strategy pursued -Continue apixaban -Continue metoprolol succinate -09/30/2018 echo EF 25%, mild dilated RV, HK apical anterior septal -rates ~100-110  CKD stage III -The patient's renal function has been widely variable -Appears that the patient has new renal baseline is 1.7-2.0 -Concerned about the degree of cardiorenal syndrome -Monitor trend, Cr worsen with diuresis -will need to tolerate worsen renal function for improved respiratory/fluid status  Anemia of Chronic Disease -baseline Hgb 7-8 -Hgb trending down due to frequent blood draws -FOBT NEG (02/25/19 and 11/29/18) -check iron studies -check B12, folate  Diabetes mellitus type 2 -01/02/2019 hemoglobin A1c 5.5 -Discontinue 70/30 -NovoLog sliding scale -Monitor CBGs  Hyperlipidemia -Continue statin  Essential hypertension -Blood pressure soft -continuing metoprolol succinate  GERD -pt claims protonix makes heartburn worse -d/c protonix -start famotidine -tums prn heartburn    Disposition Plan:   Home in 1-2 days  Family Communication:    No family at bedside.  Consultants:  cardiology  Code Status:  FULL  DVT  Prophylaxis:  apixaban   Procedures: As Listed in Progress Note Above   Antibiotics: cefazolin 7/20>>>   Subjective: No fever, no chest pain, no nausea, no vomiting; patient reports breathing continue improving.  Objective: Vitals:   02/26/19 0914 02/26/19 1110 02/26/19 1400 02/26/19 1500  BP: (!) 94/54  90/61   Pulse: 84  85   Resp: 20  (!) 21   Temp:  98.3 F (36.8 C)    TempSrc:  Oral    SpO2: 100%  100%   Weight:    110.9 kg  Height:        Intake/Output Summary (Last 24 hours) at 02/26/2019 1558 Last data filed at 02/26/2019 0900 Gross per 24 hour  Intake 240 ml  Output 75 ml  Net 165 ml   Weight change: 0.5 kg  Exam: General exam: Alert, awake, oriented x 3; reports improvement in her breathing; no chest pain, no nausea, no vomiting.  Using 3 L nasal cannula supplementation.  Patient denies orthopnea. Still having fluid overload in her lower extremities and also increased abdominal girth. Respiratory system: No wheezing, positive scattered rhonchi; no frank crackles.  Decreased breath sounds at the bases. Cardiovascular system: No murmurs, no rubs, difficult to assess JVD due to body habitus.  Irregularly irregular. Gastrointestinal system: Abdomen is obese, nondistended, soft and nontender. No organomegaly or masses felt. Normal bowel sounds heard. Central nervous system: Alert and oriented. No focal neurological deficits. Extremities: No cyanosis or clubbing; 1+ edema bilaterally appreciated on exam. Psychiatry: Judgement and insight appear normal. Mood & affect appropriate.    Data Reviewed: I have personally reviewed following labs and imaging studies   Basic Metabolic Panel: Recent Labs  Lab 02/24/19 0042 02/24/19 0618 02/25/19 0551 02/26/19 0441  NA 137 137 135 133*  K 5.1 4.9 4.8 4.8  CL 105 104 103 101  CO2 21* 25 22 23   GLUCOSE 75 85 188* 199*  BUN 44* 46* 51* 62*  CREATININE 2.25* 2.28* 2.39* 2.45*  CALCIUM 8.8* 8.6* 8.4* 8.5*   Liver Function Tests: Recent Labs  Lab 02/24/19 0042  AST 26  ALT 15   ALKPHOS 110  BILITOT 1.9*  PROT 6.8  ALBUMIN 3.2*   CBC: Recent Labs  Lab 02/24/19 0042 02/24/19 0618 02/25/19 0551 02/26/19 0441  WBC 13.5* 12.5* 9.0 6.7  NEUTROABS 11.2*  --   --   --   HGB 8.6* 7.5* 7.1* 7.5*  HCT 30.6* 26.4* 25.3* 26.4*  MCV 78.9* 78.1* 78.8* 77.6*  PLT 183 154 152 154   CBG: Recent Labs  Lab 02/25/19 1147 02/25/19 1635 02/25/19 2132 02/26/19 0805 02/26/19 1125  GLUCAP 207* 196* 209* 169* 209*   HbA1C: No results for input(s): HGBA1C in the last 72 hours.   Urine analysis:    Component Value Date/Time   COLORURINE YELLOW 01/01/2019 0433   APPEARANCEUR HAZY (A) 01/01/2019 0433   LABSPEC 1.009 01/01/2019 0433   PHURINE 5.0 01/01/2019 0433   GLUCOSEU NEGATIVE 01/01/2019 0433   HGBUR LARGE (A) 01/01/2019 0433   BILIRUBINUR NEGATIVE 01/01/2019 0433   KETONESUR NEGATIVE 01/01/2019 0433   PROTEINUR NEGATIVE 01/01/2019 0433   UROBILINOGEN 0.2 11/05/2008 0627   NITRITE NEGATIVE 01/01/2019 0433   LEUKOCYTESUR NEGATIVE 01/01/2019 0433    Recent Results (from the past 240 hour(s))  SARS Coronavirus 2 (CEPHEID- Performed in [facility] hospital lab), Hosp Order     Status: None   Collection Time: 02/24/19 12:50 AM   Specimen: Nasopharyngeal Swab  Result Value Ref  Range Status   SARS Coronavirus 2 NEGATIVE NEGATIVE Final    Comment: (NOTE) If result is NEGATIVE SARS-CoV-2 target nucleic acids are NOT DETECTED. The SARS-CoV-2 RNA is generally detectable in upper and lower  respiratory specimens during the acute phase of infection. The lowest  concentration of SARS-CoV-2 viral copies this assay can detect is 250  copies / mL. A negative result does not preclude SARS-CoV-2 infection  and should not be used as the sole basis for treatment or other  patient management decisions.  A negative result may occur with  improper specimen collection / handling, submission of specimen other  than nasopharyngeal swab, presence of viral mutation(s) within the   areas targeted by this assay, and inadequate number of viral copies  (<250 copies / mL). A negative result must be combined with clinical  observations, patient history, and epidemiological information. If result is POSITIVE SARS-CoV-2 target nucleic acids are DETECTED. The SARS-CoV-2 RNA is generally detectable in upper and lower  respiratory specimens dur ing the acute phase of infection.  Positive  results are indicative of active infection with SARS-CoV-2.  Clinical  correlation with patient history and other diagnostic information is  necessary to determine patient infection status.  Positive results do  not rule out bacterial infection or co-infection with other viruses. If result is PRESUMPTIVE POSTIVE SARS-CoV-2 nucleic acids MAY BE PRESENT.   A presumptive positive result was obtained on the submitted specimen  and confirmed on repeat testing.  While 2019 novel coronavirus  (SARS-CoV-2) nucleic acids may be present in the submitted sample  additional confirmatory testing may be necessary for epidemiological  and / or clinical management purposes  to differentiate between  SARS-CoV-2 and other Sarbecovirus currently known to infect humans.  If clinically indicated additional testing with an alternate test  methodology [phone]) is advised. The SARS-CoV-2 RNA is generally  detectable in upper and lower respiratory sp ecimens during the acute  phase of infection. The expected result is Negative. Fact Sheet for Patients:  StrictlyIdeas.no Fact Sheet for Healthcare Providers: BankingDealers.co.za This test is not yet approved or cleared by the [place] FDA and has been authorized for detection and/or diagnosis of SARS-CoV-2 by FDA under an Emergency Use Authorization (EUA).  This EUA will remain in effect (meaning this test can be used) for the duration of the COVID-19 declaration under Section 564(b)(1) of the Act, 21 U.S.C.  section 360bbb-3(b)(1), unless the authorization is terminated or revoked sooner. Performed at [facility], [address] [place], [place] [address]   Blood culture (routine x 2)     Status: None (Preliminary result)   Collection Time: 02/24/19  2:24 AM   Specimen: BLOOD LEFT WRIST  Result Value Ref Range Status   Specimen Description BLOOD LEFT WRIST  Final   Special Requests   Final    BOTTLES DRAWN AEROBIC AND ANAEROBIC Blood Culture results may not be optimal due to an inadequate volume of blood received in culture bottles   Culture   Final    NO GROWTH 2 DAYS Performed at [facility], [address], [place], [place] [address]    Report Status PENDING  Incomplete  Blood culture (routine x 2)     Status: None (Preliminary result)   Collection Time: 02/24/19  2:33 AM   Specimen: BLOOD RIGHT HAND  Result Value Ref Range Status   Specimen Description BLOOD RIGHT HAND  Final   Special Requests   Final    BOTTLES DRAWN AEROBIC AND ANAEROBIC  Blood Culture adequate volume   Culture   Final    NO GROWTH 2 DAYS Performed at [facility], [address] [place], [place] [address]    Report Status PENDING  Incomplete  MRSA PCR Screening     Status: None   Collection Time: 02/24/19  5:22 PM   Specimen: Nasal Mucosa; Nasopharyngeal  Result Value Ref Range Status   MRSA by PCR NEGATIVE NEGATIVE Final    Comment:        The GeneXpert MRSA Assay (FDA approved for NASAL specimens only), is one component of a comprehensive MRSA colonization surveillance program. It is not intended to diagnose MRSA infection nor to guide or monitor treatment for MRSA infections. Performed at [facility], [address], [place], [place] [address]      Scheduled Meds: . apixaban  5 mg Oral BID  . atorvastatin  20 mg Oral q1800  . colestipol  1 g Oral Daily  . famotidine  20 mg Oral Daily  . [START ON 02/27/2019] ferrous sulfate  325 mg Oral Q breakfast  . insulin aspart  0-15 Units Subcutaneous  TID WC  . insulin aspart  0-5 Units Subcutaneous QHS  . latanoprost  1 drop Both Eyes QHS  . magnesium oxide  200 mg Oral Daily  . metoprolol succinate  50 mg Oral Daily  . mometasone-formoterol  2 puff Inhalation BID  . multivitamin with minerals  1 tablet Oral Daily  . sodium chloride flush  3 mL Intravenous Q12H  . tiZANidine  4 mg Oral QHS   Continuous Infusions: . sodium chloride    .  ceFAZolin (ANCEF) IV 1 g (02/26/19 1134)    Procedures/Studies: Dg Chest Port 1 View  Result Date: 02/25/2019 CLINICAL DATA:  68 year old with shortness of breath. Followup edema and effusions. EXAM: PORTABLE CHEST 1 VIEW COMPARISON:  02/24/2019 and earlier. FINDINGS: Prior sternotomy. Cardiac silhouette moderately to markedly enlarged, unchanged. Pulmonary venous hypertension and mild interstitial and airspace pulmonary edema, slightly increased since yesterday. Stable BILATERAL pleural effusions, RIGHT greater than LEFT, and associated mild passive atelectasis in the lower lobes. IMPRESSION: 1. Slight worsening of CHF and/or fluid overload, with stable moderate to marked cardiomegaly and mild diffuse interstitial and airspace pulmonary edema. 2. Stable BILATERAL pleural effusions, RIGHT greater than LEFT, and associated mild passive atelectasis in the lower lobes. Electronically Signed   By: Evangeline Dakin M.D.   On: 02/25/2019 14:50   Dg Chest Portable 1 View  Result Date: 02/24/2019 CLINICAL DATA:  68 year old female with shortness of breath. EXAM: PORTABLE CHEST 1 VIEW COMPARISON:  Chest radiograph dated 01/06/2019 FINDINGS: Interval removal of the right IJ central line. There is shallow inspiration with bibasilar atelectasis. Probable bilateral small pleural effusions. No pneumothorax. Stable cardiomegaly with mild vascular congestion. Median sternotomy wires. No acute osseous pathology. IMPRESSION: Cardiomegaly with mild vascular congestion and small bilateral pleural effusions. Electronically  Signed   By: Anner Crete M.D.   On: 02/24/2019 01:23    Barton Dubois, MD  [facility] Pager [phone]   LOS: 2 days

## 2019-02-27 ENCOUNTER — Telehealth: Payer: Self-pay

## 2019-02-27 DIAGNOSIS — E1121 Type 2 diabetes mellitus with diabetic nephropathy: Secondary | ICD-10-CM

## 2019-02-27 DIAGNOSIS — N189 Chronic kidney disease, unspecified: Secondary | ICD-10-CM

## 2019-02-27 LAB — BASIC METABOLIC PANEL
Anion gap: 9 (ref 5–15)
BUN: 70 mg/dL — ABNORMAL HIGH (ref 8–23)
CO2: 23 mmol/L (ref 22–32)
Calcium: 8.3 mg/dL — ABNORMAL LOW (ref 8.9–10.3)
Chloride: 101 mmol/L (ref 98–111)
Creatinine, Ser: 2.39 mg/dL — ABNORMAL HIGH (ref 0.44–1.00)
GFR calc Af Amer: 24 mL/min — ABNORMAL LOW (ref >60)
GFR calc non Af Amer: 20 mL/min — ABNORMAL LOW (ref >60)
Glucose, Bld: 223 mg/dL — ABNORMAL HIGH (ref 70–99)
Potassium: 5 mmol/L (ref 3.5–5.1)
Sodium: 133 mmol/L — ABNORMAL LOW (ref 135–145)

## 2019-02-27 LAB — GLUCOSE, CAPILLARY
Glucose-Capillary: 193 mg/dL — ABNORMAL HIGH (ref 70–99)
Glucose-Capillary: 220 mg/dL — ABNORMAL HIGH (ref 70–99)
Glucose-Capillary: 217 mg/dL — ABNORMAL HIGH (ref 70–99)

## 2019-02-27 MED ORDER — TORSEMIDE 20 MG PO TABS: ORAL_TABLET | ORAL | 2 refills | Status: DC

## 2019-02-27 MED ORDER — CEPHALEXIN 250 MG PO CAPS: 250.0000 mg | ORAL_CAPSULE | Freq: Three times a day (TID) | ORAL | 0 refills | Status: AC

## 2019-02-27 MED ORDER — TORSEMIDE 20 MG PO TABS
40.0000 mg | ORAL_TABLET | Freq: Two times a day (BID) | ORAL | Status: DC
  Administered 2019-02-27: 40 mg via ORAL
  Filled 2019-02-27: qty 2

## 2019-02-27 NOTE — Telephone Encounter (Signed)
-----   Message from Arnoldo Lenis, MD sent at 02/27/2019 10:35 AM EDT ----- Can we refer this patient to [place] [facility] for CKD   J BranchMD

## 2019-02-27 NOTE — Progress Notes (Signed)
Inpatient Diabetes Program Recommendations  AACE/ADA: New Consensus Statement on Inpatient Glycemic Control   Target Ranges:  Prepandial:   less than 140 mg/dL      Peak postprandial:   less than 180 mg/dL (1-2 hours)      Critically ill patients:  140 - 180 mg/dL  Results for Erica [name], Erica [name] (MRN [id number]) as of 02/27/2019 07:58  Ref. Range 02/26/2019 08:05 02/26/2019 11:25 02/26/2019 16:21 02/26/2019 21:07 02/27/2019 07:17  Glucose-Capillary Latest Ref Range: 70 - 99 mg/dL 169 (H) 209 (H) 224 (H) 222 (H) 193 (H)   Results for Erica [name], Erica [name] (MRN [id number]) as of 02/26/2019 08:09  Ref. Range 02/25/2019 07:48 02/25/2019 11:47 02/25/2019 16:35 02/25/2019 21:32  Glucose-Capillary Latest Ref Range: 70 - 99 mg/dL 151 (H) 207 (H) 196 (H) 209 (H)   Review of Glycemic Control  Diabetes history: DM2 Outpatient Diabetes medications: 70/30 15 units BID Current orders for Inpatient glycemic control: Novolog 0-15 units TID with meals, Novolog 0-5 units QHS  Inpatient Diabetes Program Recommendations:   Insulin - Meal Coverage: Post prandial glucose is consistently elevated.  Please consider ordering Novolog 4 units TID with meals for meal coverage if patient eats at least 50% of meals.  Thanks, Barnie Alderman, RN, MSN, CDE Diabetes Coordinator Inpatient Diabetes Program [phone] (Team Pager from 8am to 5pm)

## 2019-02-27 NOTE — Discharge Summary (Signed)
Physician Discharge Summary  Erica [name] NAT:[id number] DOB: Feb 23, 1951 DOA: 02/23/2019  PCP: Curlene Labrum, MD  Admit date: 02/23/2019 Discharge date: 02/27/2019  Time spent: 35 minutes  Recommendations for Outpatient Follow-up:  1. Reassess complete resolution of right lower extremity cellulitis process. 2. Repeat basic metabolic panel to follow electrolytes and renal function 3. Close follow-up to patient's volume status with further adjustment of diuretic regimen as needed.   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  Patient discharged home with instructions to follow-up with PCP in 2 weeks and to follow-up with cardiology service as instructed.  Diet recommendation: Modified carbohydrate diet and heart healthy diet.  Filed Weights   02/26/19 0500 02/26/19 1500 02/27/19 0500  Weight: 113.5 kg 110.9 kg 112.3 kg    History of present illness:  As per H&P written by Dr. Denton Brick on 02/24/2019 Erica [name]  is a 68 y.o. female with past medical history ofCAD (s/p CABG in 10/2012), carotid artery stenosis, chronic combined systolic and diastolic CHF (EF previously 40%, 50% by repeat imaging in 03/2018, at 25% by echo in 09/2018),persistent atrial fibrillation (diagnosed in 09/2018,s/p DCCV in 11/2018 but did not maintain NSR --> rate-control strategy pursued),HTN, HLD, IDDM,Stage 3 CKD,and prior CVA presents to the ED with complaints of worsening shortness of breath, orthopnea, and increased oxygen requirement  --- No chest  pain, no palpitations, no dizziness,  -- Complaints of weeping drainage from both legs right more than left, with increasing edema, no fevers, no chills,  --Patient denies any significant fatigue and dyspnea on exertion  In ED.Marland Kitchen She is found to be more hypoxic than baseline, PTA she uses 3 L of oxygen  In ED----chest x-ray with vascular congestion and bilateral effusions consistent with CHF -Hemoglobin is stable at 8.6, creatinine is up to 2.25, BNP is elevated at 3468 up from 1987 previously,  --WBC 13.5, troponin is 23 and platelet count 183 --Patient received IV Lasix in the ED with some urine output, but blood pressure has been soft.  Hospital Course:  Acute on chronic respiratory failure with hypoxia -Secondary to decompensated CHF -Patient has been weaned back to her baseline 3 L with pox 100% -Normal respiratory effort and no use of accessory muscles of discharge.  Acute on chronic combined systolic and diastolic CHF -11/06/7060 discharge weight 248.9 pounds -01/30/19 office weight 235 -question weight accuracy (bed weights); at discharge standing weight 242 -body habitus results in difficult assessing fluid status -BNP improved and down from admission. Cr worsen with aggressive diuresis -Following cardiology recommendations patient will be discharged on torsemide 40 mg in the morning and 20 mg at nighttime -Low-sodium diet, daily weights and adequate hydration has been discussed with patient. -Will continue the use of beta-blocker. -No ACE inhibitors secondary to renal failure.  Cellulitis RLE -see pictures from 02/24/19 -received cefazolin with great response -discharge on keflex to complete antibiotic therapy.  Persistent atrial fibrillation -s/p DCCV in 11/2018 but did not maintain NSR --> rate-control strategy pursued -Continue apixaban -Continue metoprolol succinate -09/30/2018 echo EF 25%, mild dilated RV, HK apical anterior septal -rates ~100-110 -Outpatient  follow-up with cardiology service  arranged.  Acute on chronic renal failure : stage III -The patient's renal function has been widely variable -Appears that the patient has new renal baseline is 1.7-2.0 -Concerned about presumption of cardiorenal syndrome -Creatinine at discharge 2.3 -Patient will require outpatient follow-up with nephrology service. -Torsemide dose adjusted as per cardiology recommendations -Repeat basic metabolic panel at her next visit to follow electrolytes and renal function and stability.  Anemia of Chronic Disease -baseline Hgb 7-8 -FOBT NEG (02/25/19 and 11/29/18) -No overt bleeding appreciated -Hemoglobin at discharge 7.5  Diabetes mellitus type 2 -Resume home hypoglycemic regimen -Patient advised to avoid skipping meals and to follow modified carbohydrate diet. -Continue close follow-up patient CBGs and further adjustment to hypoglycemic regimen as needed.  Hyperlipidemia -Continue statin -Follow lipid panel and LFTs as an outpatient.  Essential hypertension -Blood pressure soft, but is stable. -continuing metoprolol succinate -Advised to follow low-sodium diet.  GERD -pt claims protonix makes heartburn worse -Continue the use of omeprazole as previously prescribed. -After changes discussed with patient.  Physical deconditioning -Therapy evaluation has recommended skilled nursing facility; patient has declined offer. -Plan B with home health services providing home health nurse, home health physical therapy and home health aide has been arranged at time of discharge.  Morbid obesity -Body mass index is 48.35 kg/m. -Low calorie diet, portion control and increase physical activity discussed with patient.  Procedures:  See below for x-ray reports  Consultations:  Cardiology service  Discharge Exam: Vitals:   02/27/19 0735 02/27/19 1332  BP:  (!) 97/40  Pulse:  75  Resp:  19  Temp:  98.2 F (36.8 C)  SpO2: 100% 100%    General:  Afebrile, no chest pain, no nausea, no vomiting.  Reports breathing back to baseline and having good oxygen saturation with chronic 3 L supplementation.  Patient continues to report good urine output. Cardiovascular: Rate controlled, no rubs, no gallops.  Unable to properly assess JVD due to body habitus. Respiratory: Improved air movement bilaterally, normal respiratory effort, no using accessory muscles.  Positive scattered rhonchi.  No frank crackles. Abdomen: Obese, soft, nontender, nondistended, positive bowel sounds Extremities: Significant improvement in right lower extremity swelling and erythematous changes; no active drainage.   Discharge Instructions   Discharge Instructions    (HEART FAILURE PATIENTS) Call MD:  Anytime you have any of the following symptoms: 1) 3 pound weight gain in 24 hours or 5 pounds in 1 week 2) shortness of breath, with or without a dry hacking cough 3) swelling in the hands, feet or stomach 4) if you have to sleep on extra pillows at night in order to breathe.   Complete by: As directed    Diet - low sodium heart healthy   Complete by: As directed    Discharge instructions   Complete by: As directed    Take medications as prescribed Follow low-sodium diet (less than 2 g daily) and modified carbohydrate diet. Follow-up with cardiology service as instructed Follow-up with PCP in 2 weeks. Maintain adequate hydration     Allergies as of 02/27/2019      Reactions   Bee Venom    Tradjenta [linagliptin]    Codeine Nausea Only, Other (See Comments)   Other reaction(s): Other (See Comments) headache      Medication List    TAKE these medications   Advair Diskus 250-50 MCG/DOSE Aepb Generic drug: Fluticasone-Salmeterol Inhale 1 puff into the lungs 2 (two) times daily.   atorvastatin 20 MG tablet Commonly known as: LIPITOR Take  1 tablet (20 mg total) by mouth daily at 6 PM.   cephALEXin 250 MG capsule Commonly known as: KEFLEX Take 1 capsule (250  mg total) by mouth 3 (three) times daily for 5 days.   cholecalciferol 1000 units tablet Commonly known as: VITAMIN D Take 1,000 Units by mouth daily.   colestipol 1 g tablet Commonly known as: COLESTID TAKE 1 TABLET BY MOUTH 30 MINUTES BEFORE BREAKFAST AND SUPPER What changed: See the new instructions.   Eliquis 5 MG Tabs tablet Generic drug: apixaban Take 5 mg by mouth 2 (two) times daily.   insulin NPH-regular Human (70-30) 100 UNIT/ML injection Inject 12 Units into the skin 2 (two) times daily with a meal. What changed: how much to take   Magnesium 400 MG Caps Take 1 capsule by mouth daily.   metoprolol succinate 50 MG 24 hr tablet Commonly known as: TOPROL-XL Take 1 tablet (50 mg total) by mouth daily. Take with or immediately following a meal.   multivitamin tablet Take 1 tablet by mouth daily.   nitroGLYCERIN 0.4 MG SL tablet Commonly known as: Nitrostat Place 1 tablet (0.4 mg total) under the tongue every 5 (five) minutes as needed for chest pain.   omeprazole 20 MG capsule Commonly known as: PRILOSEC TAKE 1 CAPSULE BY MOUTH EVERY DAY What changed: how much to take   potassium chloride SA 20 MEQ tablet Commonly known as: K-DUR Take 1 tablet by mouth daily.   tiZANidine 4 MG tablet Commonly known as: ZANAFLEX Take 4 mg by mouth at bedtime.   torsemide 20 MG tablet Commonly known as: DEMADEX Take 40 mg by mouth in the morning and 20 mg by mouth in the evening. What changed: additional instructions   travoprost (benzalkonium) 0.004 % ophthalmic solution Commonly known as: TRAVATAN Place 1 drop into both eyes at bedtime.      Allergies  Allergen Reactions  . Bee Venom   . Tradjenta [Linagliptin]   . Codeine Nausea Only and Other (See Comments)    Other reaction(s): Other (See Comments) headache   Follow-up Information    Burdine, Virgina Evener, MD. Schedule an appointment as soon as possible for a visit in 2 week(s).   Specialty: Family  Medicine Contact information: [place] [address] [phone]        Herminio Commons, MD .   Specialty: Cardiology Contact information: [place] [address] [place] [address] [phone]           The results of significant diagnostics from this hospitalization (including imaging, microbiology, ancillary and laboratory) are listed below for reference.    Significant Diagnostic Studies: Dg Chest Port 1 View  Result Date: 02/25/2019 CLINICAL DATA:  68 year old with shortness of breath. Followup edema and effusions. EXAM: PORTABLE CHEST 1 VIEW COMPARISON:  02/24/2019 and earlier. FINDINGS: Prior sternotomy. Cardiac silhouette moderately to markedly enlarged, unchanged. Pulmonary venous hypertension and mild interstitial and airspace pulmonary edema, slightly increased since yesterday. Stable BILATERAL pleural effusions, RIGHT greater than LEFT, and associated mild passive atelectasis in the lower lobes. IMPRESSION: 1. Slight worsening of CHF and/or fluid overload, with stable moderate to marked cardiomegaly and mild diffuse interstitial and airspace pulmonary edema. 2. Stable BILATERAL pleural effusions, RIGHT greater than LEFT, and associated mild passive atelectasis in the lower lobes. Electronically Signed   By: Evangeline Dakin M.D.   On: 02/25/2019 14:50   Dg Chest Portable 1 View  Result Date: 02/24/2019 CLINICAL DATA:  68 year old female with shortness of  breath. EXAM: PORTABLE CHEST 1 VIEW COMPARISON:  Chest radiograph dated 01/06/2019 FINDINGS: Interval removal of the right IJ central line. There is shallow inspiration with bibasilar atelectasis. Probable bilateral small pleural effusions. No pneumothorax. Stable cardiomegaly with mild vascular congestion. Median sternotomy wires. No acute osseous pathology. IMPRESSION: Cardiomegaly with mild vascular congestion and small bilateral pleural effusions. Electronically Signed   By: Anner Crete M.D.   On: 02/24/2019  01:23    Microbiology: Recent Results (from the past 240 hour(s))  SARS Coronavirus 2 (CEPHEID- Performed in [facility] hospital lab), Hosp Order     Status: None   Collection Time: 02/24/19 12:50 AM   Specimen: Nasopharyngeal Swab  Result Value Ref Range Status   SARS Coronavirus 2 NEGATIVE NEGATIVE Final    Comment: (NOTE) If result is NEGATIVE SARS-CoV-2 target nucleic acids are NOT DETECTED. The SARS-CoV-2 RNA is generally detectable in upper and lower  respiratory specimens during the acute phase of infection. The lowest  concentration of SARS-CoV-2 viral copies this assay can detect is 250  copies / mL. A negative result does not preclude SARS-CoV-2 infection  and should not be used as the sole basis for treatment or other  patient management decisions.  A negative result may occur with  improper specimen collection / handling, submission of specimen other  than nasopharyngeal swab, presence of viral mutation(s) within the  areas targeted by this assay, and inadequate number of viral copies  (<250 copies / mL). A negative result must be combined with clinical  observations, patient history, and epidemiological information. If result is POSITIVE SARS-CoV-2 target nucleic acids are DETECTED. The SARS-CoV-2 RNA is generally detectable in upper and lower  respiratory specimens dur ing the acute phase of infection.  Positive  results are indicative of active infection with SARS-CoV-2.  Clinical  correlation with patient history and other diagnostic information is  necessary to determine patient infection status.  Positive results do  not rule out bacterial infection or co-infection with other viruses. If result is PRESUMPTIVE POSTIVE SARS-CoV-2 nucleic acids MAY BE PRESENT.   A presumptive positive result was obtained on the submitted specimen  and confirmed on repeat testing.  While 2019 novel coronavirus  (SARS-CoV-2) nucleic acids may be present in the submitted sample   additional confirmatory testing may be necessary for epidemiological  and / or clinical management purposes  to differentiate between  SARS-CoV-2 and other Sarbecovirus currently known to infect humans.  If clinically indicated additional testing with an alternate test  methodology [phone]) is advised. The SARS-CoV-2 RNA is generally  detectable in upper and lower respiratory sp ecimens during the acute  phase of infection. The expected result is Negative. Fact Sheet for Patients:  StrictlyIdeas.no Fact Sheet for Healthcare Providers: BankingDealers.co.za This test is not yet approved or cleared by the [place] FDA and has been authorized for detection and/or diagnosis of SARS-CoV-2 by FDA under an Emergency Use Authorization (EUA).  This EUA will remain in effect (meaning this test can be used) for the duration of the COVID-19 declaration under Section 564(b)(1) of the Act, 21 U.S.C. section 360bbb-3(b)(1), unless the authorization is terminated or revoked sooner. Performed at [facility], [address], [place], [place] [address]   Blood culture (routine x 2)     Status: None (Preliminary result)   Collection Time: 02/24/19  2:24 AM   Specimen: BLOOD LEFT WRIST  Result Value Ref Range Status   Specimen Description BLOOD LEFT WRIST  Final   Special Requests  Final    BOTTLES DRAWN AEROBIC AND ANAEROBIC Blood Culture results may not be optimal due to an inadequate volume of blood received in culture bottles   Culture   Final    NO GROWTH 3 DAYS Performed at [facility], [address], [place], [place] [address]    Report Status PENDING  Incomplete  Blood culture (routine x 2)     Status: None (Preliminary result)   Collection Time: 02/24/19  2:33 AM   Specimen: BLOOD RIGHT HAND  Result Value Ref Range Status   Specimen Description BLOOD RIGHT HAND  Final   Special Requests   Final    BOTTLES DRAWN AEROBIC AND ANAEROBIC  Blood Culture adequate volume   Culture   Final    NO GROWTH 3 DAYS Performed at [facility], [address], [place], [place] [address]    Report Status PENDING  Incomplete  MRSA PCR Screening     Status: None   Collection Time: 02/24/19  5:22 PM   Specimen: Nasal Mucosa; Nasopharyngeal  Result Value Ref Range Status   MRSA by PCR NEGATIVE NEGATIVE Final    Comment:        The GeneXpert MRSA Assay (FDA approved for NASAL specimens only), is one component of a comprehensive MRSA colonization surveillance program. It is not intended to diagnose MRSA infection nor to guide or monitor treatment for MRSA infections. Performed at [facility], [address], [place], [place] [address]      Labs: Basic Metabolic Panel: Recent Labs  Lab 02/24/19 0042 02/24/19 0618 02/25/19 0551 02/26/19 0441 02/27/19 0544  NA 137 137 135 133* 133*  K 5.1 4.9 4.8 4.8 5.0  CL 105 104 103 101 101  CO2 21* 25 22 23 23   GLUCOSE 75 85 188* 199* 223*  BUN 44* 46* 51* 62* 70*  CREATININE 2.25* 2.28* 2.39* 2.45* 2.39*  CALCIUM 8.8* 8.6* 8.4* 8.5* 8.3*   Liver Function Tests: Recent Labs  Lab 02/24/19 0042  AST 26  ALT 15  ALKPHOS 110  BILITOT 1.9*  PROT 6.8  ALBUMIN 3.2*   CBC: Recent Labs  Lab 02/24/19 0042 02/24/19 0618 02/25/19 0551 02/26/19 0441  WBC 13.5* 12.5* 9.0 6.7  NEUTROABS 11.2*  --   --   --   HGB 8.6* 7.5* 7.1* 7.5*  HCT 30.6* 26.4* 25.3* 26.4*  MCV 78.9* 78.1* 78.8* 77.6*  PLT 183 154 152 154    BNP (last 3 results) Recent Labs    01/30/19 1225 02/24/19 0042 02/26/19 0441  BNP 1,845* 3,468.0* 1,925.0*    CBG: Recent Labs  Lab 02/26/19 1125 02/26/19 1621 02/26/19 2107 02/27/19 0717 02/27/19 1108  GLUCAP 209* 224* 222* 193* 220*    Signed:  Barton Dubois MD.  [facility] 02/27/2019, 1:45 PM

## 2019-02-27 NOTE — TOC Transition Note (Addendum)
Transition of Care [facility]) - CM/SW Discharge Note   Patient Details  Name: Erica [name] MRN: [id number] Date of Birth: [birth year]  [facility] CM/SW Contact:  Ihor Gully, LCSW Phone Number: 02/27/2019, 1:42 PM   Clinical Narrative:    Baker Janus with [facility] advised that patient is expected to discharge today.  HH orders faxed. D/C summary faxed to [facility].  PCP's office to print d/c summary.  PCP appointment scheduled for Wednesday, 03/05/2019 @ 3:30.  LCSW signing off.      Barriers to Discharge: Continued Medical Work up   Patient Goals and CMS Choice        Discharge Placement                       Discharge Plan and Services     Post Acute Care Choice: Home Health                               Social Determinants of Health (SDOH) Interventions     Readmission Risk Interventions Readmission Risk Prevention Plan 02/25/2019 01/06/2019 01/03/2019  Transportation Screening Complete Complete -  PCP or Specialist Appt within 5-7 Days - - -  PCP or Specialist Appt within 3-5 Days Not Complete - Complete  Home Care Screening - - -  Medication Review (RN CM) - - -  [place] or Home Care Consult Complete - Complete  Social Work Consult for [place] Planning/Counseling Complete - Complete  Palliative Care Screening Not Complete - Not Applicable  Medication Review Press photographer) Complete - Complete  Some recent data might be hidden

## 2019-02-27 NOTE — Progress Notes (Addendum)
Progress Note  Patient Name: Erica [name] Date of Encounter: 02/27/2019  Primary Cardiologist: Kate Sable, MD   Subjective   No complaints  Inpatient Medications    Scheduled Meds: . apixaban  5 mg Oral BID  . atorvastatin  20 mg Oral q1800  . colestipol  1 g Oral Daily  . famotidine  20 mg Oral Daily  . ferrous sulfate  325 mg Oral Q breakfast  . insulin aspart  0-15 Units Subcutaneous TID WC  . insulin aspart  0-5 Units Subcutaneous QHS  . latanoprost  1 drop Both Eyes QHS  . magnesium oxide  200 mg Oral Daily  . metoprolol succinate  50 mg Oral Daily  . mometasone-formoterol  2 puff Inhalation BID  . multivitamin with minerals  1 tablet Oral Daily  . omeprazole  40 mg Oral QHS  . sodium chloride flush  3 mL Intravenous Q12H  . tiZANidine  4 mg Oral QHS   Continuous Infusions: . sodium chloride    .  ceFAZolin (ANCEF) IV 1 g (02/26/19 2357)   PRN Meds: sodium chloride, acetaminophen **OR** acetaminophen, albuterol, calcium carbonate, nitroGLYCERIN, ondansetron **OR** ondansetron (ZOFRAN) IV, polyethylene glycol, sodium chloride flush, traZODone   Vital Signs    Vitals:   02/26/19 2102 02/27/19 0500 02/27/19 0617 02/27/19 0735  BP: (!) 96/36  (!) 86/53   Pulse: 83  76   Resp: 18  17   Temp: 98.6 F (37 C)  98.3 F (36.8 C)   TempSrc:      SpO2: 98%  100% 100%  Weight:  112.3 kg    Height:        Intake/Output Summary (Last 24 hours) at 02/27/2019 1004 Last data filed at 02/27/2019 0900 Gross per 24 hour  Intake 600 ml  Output -  Net 600 ml   Last 3 Weights 02/27/2019 02/26/2019 02/26/2019  Weight (lbs) 247 lb 9.2 oz 244 lb 8 oz 250 lb 3.6 oz  Weight (kg) 112.3 kg 110.904 kg 113.5 kg      Telemetry    Rate controlled afib - Personally Reviewed  ECG    n/a- Personally Reviewed  Physical Exam   GEN: No acute distress.   Neck: No JVD Cardiac: irreg, no murmurs, rubs, or gallops.  Respiratory: Clear to auscultation bilaterally. GI:  Soft, nontender, non-distended  MS: No edema; No deformity. Neuro:  Nonfocal  Psych: Normal affect   Labs    High Sensitivity Troponin:   Recent Labs  Lab 02/24/19 0042 02/24/19 0202  TROPONINIHS 23.00* 23.00*      Cardiac EnzymesNo results for input(s): TROPONINI in the last 168 hours. No results for input(s): TROPIPOC in the last 168 hours.   Chemistry Recent Labs  Lab 02/24/19 0042  02/25/19 0551 02/26/19 0441 02/27/19 0544  NA 137   < > 135 133* 133*  K 5.1   < > 4.8 4.8 5.0  CL 105   < > 103 101 101  CO2 21*   < > 22 23 23   GLUCOSE 75   < > 188* 199* 223*  BUN 44*   < > 51* 62* 70*  CREATININE 2.25*   < > 2.39* 2.45* 2.39*  CALCIUM 8.8*   < > 8.4* 8.5* 8.3*  PROT 6.8  --   --   --   --   ALBUMIN 3.2*  --   --   --   --   AST 26  --   --   --   --  ALT 15  --   --   --   --   ALKPHOS 110  --   --   --   --   BILITOT 1.9*  --   --   --   --   GFRNONAA 22*   < > 20* 20* 20*  GFRAA 26*   < > 24* 23* 24*  ANIONGAP 11   < > 10 9 9    < > = values in this interval not displayed.     Hematology Recent Labs  Lab 02/24/19 0618 02/25/19 0551 02/26/19 0441  WBC 12.5* 9.0 6.7  RBC 3.38* 3.21* 3.40*  HGB 7.5* 7.1* 7.5*  HCT 26.4* 25.3* 26.4*  MCV 78.1* 78.8* 77.6*  MCH 22.2* 22.1* 22.1*  MCHC 28.4* 28.1* 28.4*  RDW 18.8* 18.5* 18.6*  PLT 154 152 154    BNP Recent Labs  Lab 02/24/19 0042 02/26/19 0441  BNP 3,468.0* 1,925.0*     DDimer No results for input(s): DDIMER in the last 168 hours.   Radiology    Dg Chest Port 1 View  Result Date: 02/25/2019 CLINICAL DATA:  68 year old with shortness of breath. Followup edema and effusions. EXAM: PORTABLE CHEST 1 VIEW COMPARISON:  02/24/2019 and earlier. FINDINGS: Prior sternotomy. Cardiac silhouette moderately to markedly enlarged, unchanged. Pulmonary venous hypertension and mild interstitial and airspace pulmonary edema, slightly increased since yesterday. Stable BILATERAL pleural effusions, RIGHT greater  than LEFT, and associated mild passive atelectasis in the lower lobes. IMPRESSION: 1. Slight worsening of CHF and/or fluid overload, with stable moderate to marked cardiomegaly and mild diffuse interstitial and airspace pulmonary edema. 2. Stable BILATERAL pleural effusions, RIGHT greater than LEFT, and associated mild passive atelectasis in the lower lobes. Electronically Signed   By: Evangeline Dakin M.D.   On: 02/25/2019 14:50    Cardiac Studies    Patient Profile     68 y.o. female w/ PMH of CAD (s/p CABG in 10/2012), carotid artery stenosis, chronic combined systolic and diastolic CHF (EF previously 40%, 50% in 2019, and at 25% in 09/2018), persistent atrial fibrillation (diagnosed in 09/2018, s/p DCCV in 11/2018 but did not maintain NSR), HTN, HLD, and Stage 3 CKD, and prior CVA who is currently admitted for a CHF exacerbation.  Assessment & Plan    1. Acute on chronic systolic HF - 11/100 echo LVEF 25-30%, moderate RV dysfunction - poorly documented I/Os and inaccurate weights have made her management difficult.  - diuretics recently held due to AKI.  - she is back on her baseline O2 requirement of 3L [place], sats 100%. Symptomatically she feels better -Cr/BUN have been trending up, slight downtrend in Cr today. Start oral torsemide 40mg  bid.   - medical therapy limited by renal dysfunction and soft bp's  - standing weight 110 kg yesterday (242 lbs)  2. CAD -  s/p CABG in 2004. Initial and delta HS Troponin values have been flat at 23.00. Repeat echo shows a similar EF of 25-30% with WMA as described above.  -Not on ASA given the need for Eliquis. Continue statin and BB therapy   3. Permanent Atrial Fibrillation -Continue Toprol-XL 50mg  daily.  - remains on Eliquis for anticoagulation.  4. Stage 3 CKD -baseline creatinine 1.7 - 1.8. Elevated to 2.25 on admission and trending up to 2.39 and 2.45. IV Lasix held yesterday given increase. Had undergone lab work per Dr. Lowanda Foster as  an outpatient but never had a follow-up visit. This needs to be arranged prior to discharge.  Patient also in agreement to travel to [place] to be evaluated at [facility] if the wait here is too long.   - will have our office place referal to [facility] per patient request   [facility] for discharge today. We will arrange f/u next week along with labs. She has appt 7/30, will need labs at that appt    For questions or updates, please contact [place] Please consult [email or web address] for contact info under        Signed, Carlyle Dolly, MD  02/27/2019, 10:04 AM

## 2019-03-01 LAB — CULTURE, BLOOD (ROUTINE X 2)
Culture: NO GROWTH
Culture: NO GROWTH
Special Requests: ADEQUATE

## 2019-03-06 ENCOUNTER — Ambulatory Visit (INDEPENDENT_AMBULATORY_CARE_PROVIDER_SITE_OTHER): Payer: Medicare HMO | Admitting: Student

## 2019-03-06 ENCOUNTER — Encounter: Payer: Self-pay | Admitting: Student

## 2019-03-06 ENCOUNTER — Other Ambulatory Visit: Payer: Self-pay

## 2019-03-06 ENCOUNTER — Encounter: Payer: Self-pay | Admitting: *Deleted

## 2019-03-06 VITALS — BP 102/64 | HR 71 | Temp 98.6°F | Ht 60.0 in | Wt 239.0 lb

## 2019-03-06 DIAGNOSIS — I5042 Chronic combined systolic (congestive) and diastolic (congestive) heart failure: Secondary | ICD-10-CM | POA: Diagnosis not present

## 2019-03-06 DIAGNOSIS — I10 Essential (primary) hypertension: Secondary | ICD-10-CM | POA: Diagnosis not present

## 2019-03-06 DIAGNOSIS — I4819 Other persistent atrial fibrillation: Secondary | ICD-10-CM

## 2019-03-06 DIAGNOSIS — N183 Chronic kidney disease, stage 3 unspecified: Secondary | ICD-10-CM

## 2019-03-06 DIAGNOSIS — I2510 Atherosclerotic heart disease of native coronary artery without angina pectoris: Secondary | ICD-10-CM

## 2019-03-06 MED ORDER — TORSEMIDE 20 MG PO TABS: 20.0000 mg | ORAL_TABLET | Freq: Two times a day (BID) | ORAL | 2 refills | Status: DC

## 2019-03-06 NOTE — Patient Instructions (Signed)
Medication Instructions:   No changes.   If you need a refill on your cardiac medications before your next appointment, please call your pharmacy.  Labwork: None. We will request labs from PCP.    Testing/Procedures: None  Special Instructions: Check Weights daily. Take extra Torsemide tablet for weight gain greater than 3 lbs overnight or 5 lbs in one week.   Follow-Up: You will need a follow up appointment in 2 months.  Please call our office 2 months in advance to schedule this appointment.  You may see Kate Sable, MD or one of the following Advanced Practice Providers on your designated Care Team:   Bernerd Pho, PA-C [facility]) . Ermalinda Barrios, PA-C ([place])     At [facility] you and your health needs are our priority.  As part of our continuing mission to provide you with exceptional heart care, we have created designated Provider Care Teams.  These Care Teams include your primary Cardiologist (physician) and Advanced Practice Providers (APPs -  Physician Assistants and Nurse Practitioners) who all work together to provide you with the care you need, when you need it.  Thank you for choosing [facility] at [place].

## 2019-03-06 NOTE — Progress Notes (Signed)
Cardiology Office Note    Date:  03/06/2019   ID:  Ivanell, Deshotel [birth year], MRN [id number]  PCP:  Curlene Labrum, MD  Cardiologist: Kate Sable, MD    Chief Complaint  Patient presents with  . Hospitalization Follow-up    History of Present Illness:    Erica [name] is a 68 y.o. female with past medical history of CAD (s/p CABG in 2014), chronic combined systolic and diastolic CHF (EF previously 40%, improved to 50% in 2019, reduced to 25% in 09/2018), persistent atrial fibrillation (s/p DCCV in 11/2018 but did not maintain NSR and rate-control strategy pursued), HTN, HLD, and Stage 3 CKD who presents to the office today for hospital follow-up.  She was most recently admitted to [facility] from 02/23/2021 - 02/27/2019 for an acute CHF exacerbation. BNP was elevated to 3468 on admission and she was started on IV Lasix 40 mg twice daily. Her respiratory status returned to baseline within 2 days. Volume status was difficult to assess secondary to body habitus but her weight had trended down to 242 lbs at the time of discharge. Creatinine was stable at 2.39 and she was referred to [facility] as she had been unable to obtain an appointment with Dr. Lowanda Foster. Was discharged on Torsemide 40mg  in AM/20mg  in PM.   In talking with the patient and her daughter today, she reports her symptoms overall been stable since hospital discharge. She has been following daily weights at home and says this was 240.8 lbs today. Initially recorded on our scales is 215 lbs but was rechecked and it 239 lbs. She has baseline dyspnea on exertion and has been using 3 L nasal cannula. Reports oxygen saturations have overall remained appropriate at home. No specific orthopnea or PND. Her lower extremity edema has improved and she is followed by the wound clinic and recently had her right leg wrapped earlier today. Denies any recent chest pain. She does experience occasional palpitations and says HR has  been in the 70's to low-100's when checked at home. BP is typically soft with SBP in the 90's to 110's. She denies any associated lightheadedness, dizziness, or presyncope.   She has been taking Torsemide 20 mg twice daily since discharge and has not utilized 2 tablets in the morning. Says that labs were checked by her PCP yesterday and her creatinine had improved to 1.8. We will request these records.    Past Medical History:  Diagnosis Date  . Atrial fibrillation ([place])    a. diagnosed in 09/2018 --> s/p DCCV in 11/2018 but did not maintain NSR --> rate-control strategy pursued  . CAD (coronary artery disease)    a. s/p CABG in 10/2012 at [facility] with LIMA-LAD, SVG-OM, SVG-Diag via Y graft off OM  . Carcinoid tumor   . CHF (congestive heart failure) ([place])    a. EF previously 40%, at 50% by imaging in 03/2018, 25% by echo 2020  . Colon cancer ([place]) 2008  . DM (diabetes mellitus) ([place])   . HTN (hypertension)   . MI (myocardial infarction) ([place]) FEB 2014   Acute MI of anterior wall  . Pulmonary edema   . Stroke [facility]) 2010   Residual R weakness    Past Surgical History:  Procedure Laterality Date  . BACTERIAL OVERGROWTH TEST N/A 03/18/2015   Procedure: BACTERIAL OVERGROWTH TEST;  Surgeon: Danie Binder, MD;  Location: [facility] ENDO SUITE;  Service: Endoscopy;  Laterality: N/A;  0800  . CARDIAC CATHETERIZATION  08/2008  .  CARDIOVERSION N/A 12/03/2018   Procedure: CARDIOVERSION;  Surgeon: Satira Sark, MD;  Location: [facility] ENDO SUITE;  Service: Cardiovascular;  Laterality: N/A;  . CHOLECYSTECTOMY    . COLONOSCOPY N/A 03/03/2015   SLF: 1. One colon polyp removed 2. Mild diverticulosis in the sigmoid colon 3. Small internal hemorrhoids  . CORONARY ARTERY BYPASS GRAFT  10/08/2012  . SHOULDER SURGERY    . TUBAL LIGATION      Current Medications: Outpatient Medications Prior to Visit  Medication Sig Dispense Refill  . atorvastatin (LIPITOR) 20 MG tablet Take 1 tablet (20 mg total) by mouth  daily at 6 PM. 30 tablet 1  . cholecalciferol (VITAMIN D) 1000 UNITS tablet Take 1,000 Units by mouth daily.    . colestipol (COLESTID) 1 g tablet TAKE 1 TABLET BY MOUTH 30 MINUTES BEFORE BREAKFAST AND SUPPER (Patient taking differently: Take 1 g by mouth daily. ) 60 tablet 6  . ELIQUIS 5 MG TABS tablet Take 5 mg by mouth 2 (two) times daily.    . Fluticasone-Salmeterol (ADVAIR DISKUS) 250-50 MCG/DOSE AEPB Inhale 1 puff into the lungs 2 (two) times daily.    . insulin NPH-regular Human (70-30) 100 UNIT/ML injection Inject 12 Units into the skin 2 (two) times daily with a meal. (Patient taking differently: Inject 15 Units into the skin 2 (two) times daily with a meal. ) 10 mL 11  . Magnesium 400 MG CAPS Take 1 capsule by mouth daily. 30 capsule 6  . metoprolol succinate (TOPROL-XL) 50 MG 24 hr tablet Take 1 tablet (50 mg total) by mouth daily. Take with or immediately following a meal. 135 tablet 3  . Multiple Vitamin (MULTIVITAMIN) tablet Take 1 tablet by mouth daily.    . nitroGLYCERIN (NITROSTAT) 0.4 MG SL tablet Place 1 tablet (0.4 mg total) under the tongue every 5 (five) minutes as needed for chest pain. 25 tablet 3  . omeprazole (PRILOSEC) 20 MG capsule TAKE 1 CAPSULE BY MOUTH EVERY DAY (Patient taking differently: Take 20 mg by mouth daily. ) 90 capsule 0  . potassium chloride SA (K-DUR) 20 MEQ tablet Take 1 tablet by mouth daily.    Marland Kitchen tiZANidine (ZANAFLEX) 4 MG tablet Take 4 mg by mouth at bedtime.     . travoprost, benzalkonium, (TRAVATAN) 0.004 % ophthalmic solution Place 1 drop into both eyes at bedtime.    . torsemide (DEMADEX) 20 MG tablet Take 40 mg by mouth in the morning and 20 mg by mouth in the evening. 90 tablet 2   No facility-administered medications prior to visit.      Allergies:   Bee venom, Proton pump inhibitors, Tradjenta [linagliptin], and Codeine   Social History   Socioeconomic History  . Marital status: Divorced    Spouse name: Not on file  . Number of  children: Not on file  . Years of education: Not on file  . Highest education level: Not on file  Occupational History  . Not on file  Social Needs  . Financial resource strain: Not on file  . Food insecurity    Worry: Not on file    Inability: Not on file  . Transportation needs    Medical: Not on file    Non-medical: Not on file  Tobacco Use  . Smoking status: Never Smoker  . Smokeless tobacco: Never Used  Substance and Sexual Activity  . Alcohol use: No    Alcohol/week: 0.0 standard drinks  . Drug use: No  . Sexual activity: Not on  file  Lifestyle  . Physical activity    Days per week: Not on file    Minutes per session: Not on file  . Stress: Not on file  Relationships  . Social Herbalist on phone: Not on file    Gets together: Not on file    Attends religious service: Not on file    Active member of club or organization: Not on file    Attends meetings of clubs or organizations: Not on file    Relationship status: Not on file  Other Topics Concern  . Not on file  Social History Narrative  . Not on file     Family History:  The patient's family history includes Colon cancer in her paternal uncle; Diabetes in her mother; Heart disease in her mother.   Review of Systems:   Please see the history of present illness.     General:  No chills, fever, night sweats or weight changes.  Cardiovascular:  No chest pain, orthopnea, palpitations, paroxysmal nocturnal dyspnea. Positive for dyspnea on exertion and edema.  Dermatological: No rash, lesions/masses Respiratory: No cough, dyspnea Urologic: No hematuria, dysuria Abdominal:   No nausea, vomiting, diarrhea, bright red blood per rectum, melena, or hematemesis Neurologic:  No visual changes, wkns, changes in mental status. All other systems reviewed and are otherwise negative except as noted above.   Physical Exam:    VS:  BP 102/64   Pulse 71   Temp 98.6 F (37 C)   Ht 5' (1.524 m)   Wt 239 lb  (108.4 kg)   SpO2 91%   BMI 46.68 kg/m    General: Well developed, well nourished,female appearing in no acute distress. Head: Normocephalic, atraumatic, sclera non-icteric, no xanthomas, nares are without discharge.  Neck: No carotid bruits. JVD not elevated.  Lungs: Respirations regular and unlabored, without wheezes or rales. On 3L .  Heart: Irregularly irregular. No S3 or S4.  No murmur, no rubs, or gallops appreciated. Abdomen: Soft, non-tender, non-distended with normoactive bowel sounds. No hepatomegaly. No rebound/guarding. No obvious abdominal masses. Msk:  Strength and tone appear normal for age. No joint deformities or effusions. Extremities: No clubbing or cyanosis. Trace edema bilaterally.  Distal pedal pulses are 2+ bilaterally. Right lower extremity wrapped.  Neuro: Alert and oriented X 3. Moves all extremities spontaneously. No focal deficits noted. Psych:  Responds to questions appropriately with a normal affect. Skin: No rashes or lesions noted  Wt Readings from Last 3 Encounters:  03/06/19 239 lb (108.4 kg)  02/27/19 247 lb 9.2 oz (112.3 kg)  01/30/19 235 lb (106.6 kg)     Studies/Labs Reviewed:   EKG:  EKG is ordered today.   Recent Labs: 01/06/2019: Magnesium 2.1 02/24/2019: ALT 15 02/26/2019: B Natriuretic Peptide 1,925.0; Hemoglobin 7.5; Platelets 154 02/27/2019: BUN 70; Creatinine, Ser 2.39; Potassium 5.0; Sodium 133   Lipid Panel No results found for: CHOL, TRIG, HDL, CHOLHDL, VLDL, LDLCALC, LDLDIRECT  Additional studies/ records that were reviewed today include:   Echocardiogram: 02/2019 IMPRESSIONS    1. The left ventricle has severely reduced systolic function, with an ejection fraction of 25-30%. The cavity size was normal. Left ventricular diastolic Doppler parameters are indeterminate.  2. The anteroseptum and inferoseptal walls are akinetic.  3. The right ventricle has moderately reduced systolic function. The cavity was severely enlarged.  There is no increase in right ventricular wall thickness.  4. Left atrial size was moderately dilated.  5. Right atrial size was moderately  dilated.  6. Mitral valve regurgitation MR is moderate, possibly underestimatd by eccentricity of jet.  7. The tricuspid valve is not well visualized. Tricuspid valve regurgitation is moderate.  8. The aortic valve has an indeterminate number of cusps. No stenosis of the aortic valve.  9. The aortic root is normal in size and structure. 10. Pulmonary hypertension is moderately elevated, PASP is 53 mmHg. 11. The inferior vena cava was dilated in size with <50% respiratory variability. 12. The interatrial septum was not well visualized.   Assessment:    1. Chronic combined systolic (congestive) and diastolic (congestive) heart failure (HCC)   2. Persistent atrial fibrillation   3. Coronary artery disease involving native coronary artery of native heart without angina pectoris   4. Essential hypertension   5. CKD (chronic kidney disease) stage 3, GFR 30-59 ml/min (HCC)      Plan:   In order of problems listed above:  1. Chronic Combined Systolic and Diastolic CHF - she has a known reduced EF at 25-30% by most recent echocardiogram. Has baseline dyspnea on exertion but reports this has been stable since hospital discharge. Weight has been stable at 240 - 242 lbs on her home scales. Suspect this is close to her dry weight as her previous weight in the low 230's was obtained after discharge from SNF and she reported decreased dietary intake at that time.  - continue with Torsemide 20mg  BID. Will request recent labs from her PCP which were obtained yesterday. Continue Toprol-XL. Not on ACE-I/ARB/ARNI due to CKD. Soft BP limits the use of Imdur or Hydralazine.   2. Persistent Atrial Fibrillation - she reports occasional palpitations but HR has overall been well-controlled when checked at home. Continue Toprol-XL 50mg  daily. No evidence of active bleeding.  Remains on Eliquis for anticoagulation.   3. CAD - she is s/p CABG in 2014. She has baseline dyspnea on exertion but denies any recent chest pain. Recent ischemic evaluation in the setting of her cardiomyopathy has not been pursued given her CKD.  - continue BB and statin therapy. Not on ASA given the need for anticoagulation.   4. HTN - BP soft at 102/64 during today's visit. Continue Toprol-XL.   5. Stage 3 CKD -  Creatinine was 2.39 at the time of hospital discharge and she had this checked with her PCP yesterday and believes this was 1.8. Will request records. She has been referred to [facility] as she had been unable to obtain an appointment with Dr. Lowanda Foster. She is also going to see if the Nephrology group in [place] is accepting new patients as this would be closer to her home. I informed her to make [place] aware if a new referral is needed.    Medication Adjustments/Labs and Tests Ordered: Current medicines are reviewed at length with the patient today.  Concerns regarding medicines are outlined above.  Medication changes, Labs and Tests ordered today are listed in the Patient Instructions below. Patient Instructions  Medication Instructions:   No changes.   If you need a refill on your cardiac medications before your next appointment, please call your pharmacy.  Labwork: None. We will request labs from PCP.    Testing/Procedures: None  Special Instructions: Check Weights daily. Take extra Torsemide tablet for weight gain greater than 3 lbs overnight or 5 lbs in one week.   Follow-Up: You will need a follow up appointment in 2 months.  Please call our office 2 months in advance to schedule this appointment.  You may see Kate Sable, MD or one of the following Advanced Practice Providers on your designated Care Team:   Bernerd Pho, PA-C [facility]) . Ermalinda Barrios, PA-C ([place])     At [facility], you and your health needs are our  priority.  As part of our continuing mission to provide you with exceptional heart care, we have created designated Provider Care Teams.  These Care Teams include your primary Cardiologist (physician) and Advanced Practice Providers (APPs -  Physician Assistants and Nurse Practitioners) who all work together to provide you with the care you need, when you need it.  Thank you for choosing [facility] at [place].       Signed, Erma Heritage, PA-C  03/06/2019 6:44 PM    [facility] [address] [place], [place] [address] Phone: [phone] Fax: [phone]

## 2019-03-28 ENCOUNTER — Encounter (HOSPITAL_COMMUNITY): Payer: Self-pay | Admitting: Emergency Medicine

## 2019-03-28 ENCOUNTER — Other Ambulatory Visit: Payer: Self-pay

## 2019-03-28 DIAGNOSIS — Z4800 Encounter for change or removal of nonsurgical wound dressing: Secondary | ICD-10-CM | POA: Diagnosis present

## 2019-03-28 DIAGNOSIS — Z5321 Procedure and treatment not carried out due to patient leaving prior to being seen by health care provider: Secondary | ICD-10-CM | POA: Insufficient documentation

## 2019-03-28 LAB — BASIC METABOLIC PANEL
Anion gap: 8 (ref 5–15)
BUN: 45 mg/dL — ABNORMAL HIGH (ref 8–23)
CO2: 26 mmol/L (ref 22–32)
Calcium: 8.6 mg/dL — ABNORMAL LOW (ref 8.9–10.3)
Chloride: 102 mmol/L (ref 98–111)
Creatinine, Ser: 2.1 mg/dL — ABNORMAL HIGH (ref 0.44–1.00)
GFR calc Af Amer: 28 mL/min — ABNORMAL LOW (ref >60)
GFR calc non Af Amer: 24 mL/min — ABNORMAL LOW (ref >60)
Glucose, Bld: 107 mg/dL — ABNORMAL HIGH (ref 70–99)
Potassium: 5.3 mmol/L — ABNORMAL HIGH (ref 3.5–5.1)
Sodium: 136 mmol/L (ref 135–145)

## 2019-03-28 LAB — CBC WITH DIFFERENTIAL/PLATELET
Abs Immature Granulocytes: 0.03 10*3/uL (ref 0.00–0.07)
Basophils Absolute: 0 10*3/uL (ref 0.0–0.1)
Basophils Relative: 0 %
Eosinophils Absolute: 0.1 10*3/uL (ref 0.0–0.5)
Eosinophils Relative: 2 %
HCT: 31.3 % — ABNORMAL LOW (ref 36.0–46.0)
Hemoglobin: 8.6 g/dL — ABNORMAL LOW (ref 12.0–15.0)
Immature Granulocytes: 0 %
Lymphocytes Relative: 17 %
Lymphs Abs: 1.3 10*3/uL (ref 0.7–4.0)
MCH: 21.7 pg — ABNORMAL LOW (ref 26.0–34.0)
MCHC: 27.5 g/dL — ABNORMAL LOW (ref 30.0–36.0)
MCV: 79 fL — ABNORMAL LOW (ref 80.0–100.0)
Monocytes Absolute: 0.5 10*3/uL (ref 0.1–1.0)
Monocytes Relative: 7 %
Neutro Abs: 5.3 10*3/uL (ref 1.7–7.7)
Neutrophils Relative %: 74 %
Platelets: 226 10*3/uL (ref 150–400)
RBC: 3.96 MIL/uL (ref 3.87–5.11)
RDW: 19.1 % — ABNORMAL HIGH (ref 11.5–15.5)
WBC: 7.3 10*3/uL (ref 4.0–10.5)
nRBC: 0 % (ref 0.0–0.2)

## 2019-03-28 NOTE — ED Triage Notes (Signed)
Pt seen @ home for wound care and family states they were advised by wound care MD to bring pt to hospital because wounds are not getting any better and they were worried pt may become septic if not treated.

## 2019-03-29 ENCOUNTER — Emergency Department (HOSPITAL_COMMUNITY)
Admission: EM | Admit: 2019-03-29 | Discharge: 2019-03-29 | Disposition: A | Discharge status: Home / Self Care | Payer: Medicare HMO | Attending: Emergency Medicine | Admitting: Emergency Medicine

## 2019-03-29 NOTE — ED Notes (Signed)
Unable to locate pt in all waiting areas 

## 2019-05-06 ENCOUNTER — Telehealth: Payer: Self-pay | Admitting: Cardiovascular Disease

## 2019-05-06 NOTE — Telephone Encounter (Signed)
Virtual Visit Pre-Appointment Phone Call  "(Name), I am calling you today to discuss your upcoming appointment. We are currently trying to limit exposure to the virus that causes COVID-19 by seeing patients at home rather than in the office."  1. "What is the BEST phone number to call the day of the visit?" - include this in appointment notes  2. Do you have or have access to (through a family member/friend) a smartphone with video capability that we can use for your visit?" a. If yes - list this number in appt notes as cell (if different from BEST phone #) and list the appointment type as a VIDEO visit in appointment notes b. If no - list the appointment type as a PHONE visit in appointment notes  3. Confirm consent - "In the setting of the current Covid19 crisis, you are scheduled for a (phone or video) visit with your provider on (date) at (time).  Just as we do with many in-office visits, in order for you to participate in this visit, we must obtain consent.  If you'd like, I can send this to your mychart (if signed up) or email for you to review.  Otherwise, I can obtain your verbal consent now.  All virtual visits are billed to your insurance company just like a normal visit would be.  By agreeing to a virtual visit, we'd like you to understand that the technology does not allow for your provider to perform an examination, and thus may limit your provider's ability to fully assess your condition. If your provider identifies any concerns that need to be evaluated in person, we will make arrangements to do so.  Finally, though the technology is pretty good, we cannot assure that it will always work on either your or our end, and in the setting of a video visit, we may have to convert it to a phone-only visit.  In either situation, we cannot ensure that we have a secure connection.  Are you willing to proceed?" STAFF: Did the patient verbally acknowledge consent to telehealth visit? Document  YES/NO here: yes  4. Advise patient to be prepared - "Two hours prior to your appointment, go ahead and check your blood pressure, pulse, oxygen saturation, and your weight (if you have the equipment to check those) and write them all down. When your visit starts, your provider will ask you for this information. If you have an Apple Watch or Kardia device, please plan to have heart rate information ready on the day of your appointment. Please have a pen and paper handy nearby the day of the visit as well."  5. Give patient instructions for MyChart download to smartphone OR Doximity/Doxy.me as below if video visit (depending on what platform provider is using)  6. Inform patient they will receive a phone call 15 minutes prior to their appointment time (may be from unknown caller ID) so they should be prepared to answer    TELEPHONE CALL NOTE  Erica [name] has been deemed a candidate for a follow-up tele-health visit to limit community exposure during the Covid-19 pandemic. I spoke with the patient via phone to ensure availability of phone/video source, confirm preferred email & phone number, and discuss instructions and expectations.  I reminded Erica [name] to be prepared with any vital sign and/or heart rhythm information that could potentially be obtained via home monitoring, at the time of her visit. I reminded Erica [name] to expect a phone call prior to  her visit.  Erica [name] 05/06/2019 1:11 PM   INSTRUCTIONS FOR DOWNLOADING THE MYCHART APP TO SMARTPHONE  - The patient must first make sure to have activated MyChart and know their login information - If Apple, go to [employer] and type in MyChart in the search bar and download the app. If Android, ask patient to go to [employer] and type in [place] in the search bar and download the app. The app is free but as with any other app downloads, their phone may require them to verify saved payment information or Apple/Android  password.  - The patient will need to then log into the app with their MyChart username and password, and select [facility] as their healthcare provider to link the account. When it is time for your visit, go to the MyChart app, find appointments, and click Begin Video Visit. Be sure to Select Allow for your device to access the Microphone and Camera for your visit. You will then be connected, and your provider will be with you shortly.  **If they have any issues connecting, or need assistance please contact MyChart service desk [phone])**  **If using a computer, in order to ensure the best quality for their visit they will need to use either of the following Internet Browsers: [employer], or Google Chrome**  IF USING DOXIMITY or DOXY.ME - The patient will receive a link just prior to their visit by text.     FULL LENGTH CONSENT FOR TELE-HEALTH VISIT   I hereby voluntarily request, consent and authorize [facility] and its employed or contracted physicians, physician assistants, nurse practitioners or other licensed health care professionals (the Practitioner), to provide me with telemedicine health care services (the Services") as deemed necessary by the treating Practitioner. I acknowledge and consent to receive the Services by the Practitioner via telemedicine. I understand that the telemedicine visit will involve communicating with the Practitioner through live audiovisual communication technology and the disclosure of certain medical information by electronic transmission. I acknowledge that I have been given the opportunity to request an in-person assessment or other available alternative prior to the telemedicine visit and am voluntarily participating in the telemedicine visit.  I understand that I have the right to withhold or withdraw my consent to the use of telemedicine in the course of my care at any time, without affecting my right to future care or treatment,  and that the Practitioner or I may terminate the telemedicine visit at any time. I understand that I have the right to inspect all information obtained and/or recorded in the course of the telemedicine visit and may receive copies of available information for a reasonable fee.  I understand that some of the potential risks of receiving the Services via telemedicine include:   Delay or interruption in medical evaluation due to technological equipment failure or disruption;  Information transmitted may not be sufficient (e.g. poor resolution of images) to allow for appropriate medical decision making by the Practitioner; and/or   In rare instances, security protocols could fail, causing a breach of personal health information.  Furthermore, I acknowledge that it is my responsibility to provide information about my medical history, conditions and care that is complete and accurate to the best of my ability. I acknowledge that Practitioner's advice, recommendations, and/or decision may be based on factors not within their control, such as incomplete or inaccurate data provided by me or distortions of diagnostic images or specimens that may result from electronic transmissions. I  understand that the practice of medicine is not an exact science and that Practitioner makes no warranties or guarantees regarding treatment outcomes. I acknowledge that I will receive a copy of this consent concurrently upon execution via email to the email address I last provided but may also request a printed copy by calling the office of [facility].    I understand that my insurance will be billed for this visit.   I have read or had this consent read to me.  I understand the contents of this consent, which adequately explains the benefits and risks of the Services being provided via telemedicine.   I have been provided ample opportunity to ask questions regarding this consent and the Services and have had my questions  answered to my satisfaction.  I give my informed consent for the services to be provided through the use of telemedicine in my medical care  By participating in this telemedicine visit I agree to the above.

## 2019-05-08 ENCOUNTER — Telehealth (INDEPENDENT_AMBULATORY_CARE_PROVIDER_SITE_OTHER): Admitting: Cardiovascular Disease

## 2019-05-08 ENCOUNTER — Encounter: Payer: Self-pay | Admitting: Cardiovascular Disease

## 2019-05-08 VITALS — BP 118/62 | HR 127 | Ht 60.0 in

## 2019-05-08 DIAGNOSIS — I25118 Atherosclerotic heart disease of native coronary artery with other forms of angina pectoris: Secondary | ICD-10-CM

## 2019-05-08 DIAGNOSIS — I10 Essential (primary) hypertension: Secondary | ICD-10-CM

## 2019-05-08 DIAGNOSIS — I5042 Chronic combined systolic (congestive) and diastolic (congestive) heart failure: Secondary | ICD-10-CM | POA: Diagnosis not present

## 2019-05-08 DIAGNOSIS — I4819 Other persistent atrial fibrillation: Secondary | ICD-10-CM

## 2019-05-08 DIAGNOSIS — N183 Chronic kidney disease, stage 3 unspecified: Secondary | ICD-10-CM

## 2019-05-08 DIAGNOSIS — Z515 Encounter for palliative care: Secondary | ICD-10-CM

## 2019-05-08 MED ORDER — TORSEMIDE 20 MG PO TABS: 50.0000 mg | ORAL_TABLET | Freq: Every day | ORAL | Status: AC

## 2019-05-08 NOTE — Addendum Note (Signed)
Addended by: Laurine Blazer on: 05/08/2019 11:45 AM   Modules accepted: Orders

## 2019-05-08 NOTE — Patient Instructions (Signed)
Medication Instructions:  Continue all current medications.  Labwork: none  Testing/Procedures: none  Follow-Up: As needed.    Any Other Special Instructions Will Be Listed Below (If Applicable).  If you need a refill on your cardiac medications before your next appointment, please call your pharmacy.  

## 2019-05-08 NOTE — Progress Notes (Signed)
Virtual Visit via Telephone Note   This visit type was conducted due to national recommendations for restrictions regarding the COVID-19 Pandemic (e.g. social distancing) in an effort to limit this patient's exposure and mitigate transmission in our community.  Due to her co-morbid illnesses, this patient is at least at moderate risk for complications without adequate follow up.  This format is felt to be most appropriate for this patient at this time.  The patient did not have access to video technology/had technical difficulties with video requiring transitioning to audio format only (telephone).  All issues noted in this document were discussed and addressed.  No physical exam could be performed with this format.  Please refer to the patient's chart for her  consent to telehealth for [facility].   Date:  05/08/2019   ID:  Erica [name], DOB 01-13-1951, MRN [id number]  Patient Location: Home Provider Location: Office  PCP:  Curlene Labrum, MD  Cardiologist:  Kate Sable, MD  Electrophysiologist:  None   Evaluation Performed:  Follow-Up Visit  Chief Complaint:  CAD  History of Present Illness:    Erica [name] is a 68 y.o. female with past medical history of CAD (s/p CABG in 2014), chronic combined systolic and diastolic CHF (EF previously 40%, improved to 50% in 2019, reduced to 25% in 09/2018), persistent atrial fibrillation (s/p DCCV in 11/2018 but did not maintain NSR and rate-control strategy pursued), HTN, HLD, and Stage 3 CKD.  It appears she went to [facility] for acute volume overload and was transferred to [facility].  She underwent right heart catheterization and was found to have high output cardiac failure and volume overload.  She required 4 L nasal cannula.  She was placed on Lasix drip with Diuril and metolazone.  Right heart catheterization showed CVP 22.  She was then placed on torsemide 100 mg twice daily.  She was also placed on Lopressor 12.5  mg twice daily.  She was ultimately weaned down to oxygen 2 L nasal cannula.  She was treated for MRSA and Klebsiella UTI.  A goal of care meeting was held with the palliative care team, the CICU team, and 2 adult children on 04/07/2019.  Records regarding this are found in the EMR.  The patient and the family were deciding between trying dialysis for fluid removal given poor response of urine output with maximal diuretic dosing versus home with hospice.  Ultimately was decided to pursue home hospice care.  I reviewed her discharge medications which include Toprol-XL 25 mg daily, torsemide 100 mg twice daily, apixaban 5 mg twice daily, and potassium chloride 20 mEq daily.  She's been having some shortness of breath today. It comes and goes. She was instructed to take IV morphine and hydoxyzine for shortness of breath by the physician at [facility].  She is with her daughter, Erica [name].  The patient told me that [facility] is sending out a hospice nurse to visit her.   Past Medical History:  Diagnosis Date   Atrial fibrillation ([place])    a. diagnosed in 09/2018 --> s/p DCCV in 11/2018 but did not maintain NSR --> rate-control strategy pursued   CAD (coronary artery disease)    a. s/p CABG in 10/2012 at [facility] with LIMA-LAD, SVG-OM, SVG-Diag via Y graft off OM   Carcinoid tumor    CHF (congestive heart failure) ([place])    a. EF previously 40%, at 50% by imaging in 03/2018, 25% by echo 2020  Colon cancer ([place]) 2008   DM (diabetes mellitus) ([place])    HTN (hypertension)    MI (myocardial infarction) ([place]) FEB 2014   Acute MI of anterior wall   Pulmonary edema    Stroke ([place]) 2010   Residual R weakness   Past Surgical History:  Procedure Laterality Date   BACTERIAL OVERGROWTH TEST N/A 03/18/2015   Procedure: BACTERIAL OVERGROWTH TEST;  Surgeon: Danie Binder, MD;  Location: [facility] END[facility];  Service: Endoscopy;  Laterality: N/A;  0800   CARDIAC CATHETERIZATION  08/2008     CARDIOVERSION N/A 12/03/2018   Procedure: CARDIOVERSION;  Surgeon: Satira Sark, MD;  Location: [facility] ENDO SUITE;  Service: Cardiovascular;  Laterality: N/A;   CHOLECYSTECTOMY     COLONOSCOPY N/A 03/03/2015   SLF: 1. One colon polyp removed 2. Mild diverticulosis in the sigmoid colon 3. Small internal hemorrhoids   CORONARY ARTERY BYPASS GRAFT  10/08/2012   SHOULDER SURGERY     TUBAL LIGATION       Current Meds  Medication Sig   cholecalciferol (VITAMIN D) 1000 UNITS tablet Take 1,000 Units by mouth daily.   ELIQUIS 5 MG TABS tablet Take 5 mg by mouth 2 (two) times daily.   insulin NPH-regular Human (70-30) 100 UNIT/ML injection Inject 12 Units into the skin 2 (two) times daily with a meal. (Patient taking differently: Inject 15 Units into the skin 2 (two) times daily with a meal. As needed)   Magnesium 400 MG CAPS Take 1 capsule by mouth daily.   metoprolol succinate (TOPROL-XL) 50 MG 24 hr tablet Take 1 tablet (50 mg total) by mouth daily. Take with or immediately following a meal.   Multiple Vitamin (MULTIVITAMIN) tablet Take 1 tablet by mouth daily.   nitroGLYCERIN (NITROSTAT) 0.4 MG SL tablet Place 1 tablet (0.4 mg total) under the tongue every 5 (five) minutes as needed for chest pain.   omeprazole (PRILOSEC) 20 MG capsule TAKE 1 CAPSULE BY MOUTH EVERY DAY (Patient taking differently: Take 20 mg by mouth daily. )   potassium chloride SA (K-DUR) 20 MEQ tablet Take 1 tablet by mouth daily.   tiZANidine (ZANAFLEX) 4 MG tablet Take 4 mg by mouth at bedtime.    torsemide (DEMADEX) 20 MG tablet Take 20 mg by mouth daily.   travoprost, benzalkonium, (TRAVATAN) 0.004 % ophthalmic solution Place 1 drop into both eyes at bedtime.     Allergies:   Bee venom, Proton pump inhibitors, Tradjenta [linagliptin], and Codeine   Social History   Tobacco Use   Smoking status: Never Smoker   Smokeless tobacco: Never Used  Substance Use Topics   Alcohol use: No     Alcohol/week: 0.0 standard drinks   Drug use: No     Family Hx: The patient's family history includes Colon cancer in her paternal uncle; Diabetes in her mother; Heart disease in her mother. There is no history of Colon polyps.  ROS:   Please see the history of present illness.     All other systems reviewed and are negative.   Prior CV studies:   The following studies were reviewed today:  Echocardiogram: 02/2019 IMPRESSIONS   1. The left ventricle has severely reduced systolic function, with an ejection fraction of 25-30%. The cavity size was normal. Left ventricular diastolic Doppler parameters are indeterminate. 2. The anteroseptum and inferoseptal walls are akinetic. 3. The right ventricle has moderately reduced systolic function. The cavity was severely enlarged. There is no increase in right ventricular wall thickness. 4.  Left atrial size was moderately dilated. 5. Right atrial size was moderately dilated. 6. Mitral valve regurgitation MR is moderate, possibly underestimatd by eccentricity of jet. 7. The tricuspid valve is not well visualized. Tricuspid valve regurgitation is moderate. 8. The aortic valve has an indeterminate number of cusps. No stenosis of the aortic valve. 9. The aortic root is normal in size and structure. 10. Pulmonary hypertension is moderately elevated, PASP is 53 mmHg. 11. The inferior vena cava was dilated in size with <50% respiratory variability. 12. The interatrial septum was not well visualized.  Labs/Other Tests and Data Reviewed:    EKG:  No ECG reviewed.  Recent Labs: 01/06/2019: Magnesium 2.1 02/24/2019: ALT 15 02/26/2019: B Natriuretic Peptide 1,925.0 03/28/2019: BUN 45; Creatinine, Ser 2.10; Hemoglobin 8.6; Platelets 226; Potassium 5.3; Sodium 136   Recent Lipid Panel No results found for: CHOL, TRIG, HDL, CHOLHDL, LDLCALC, LDLDIRECT  Wt Readings from Last 3 Encounters:  03/28/19 241 lb 3.2 oz (109.4 kg)  03/06/19 239 lb  (108.4 kg)  02/27/19 247 lb 9.2 oz (112.3 kg)     Objective:    Vital Signs:  BP 118/62    Pulse (!) 127 Comment: had been giving her bath & moving her around   Ht 5' (1.524 m)    BMI 47.11 kg/m    VITAL SIGNS:  reviewed  ASSESSMENT & PLAN:    1.  Chronic combined heart failure: She was discharged by [facility] with home hospice.  LVEF 25 to 30% by echocardiogram reviewed above.  Weights are stable.  It appears she is now taking torsemide 50 mg daily although the discharge summary says 100 mg twice daily. It also appears she was instructed to take IV morphine and hydroxyzine for increasing shortness of breath.  2.  Persistent atrial fibrillation: Continue Toprol-XL.  Continue Eliquis for anticoagulation.  3.  Coronary artery disease: Status post CABG in 2014.  Recent ischemic evaluation has not been pursued given her CKD.  Continue beta-blocker.  Statin was discontinued at [facility].  No aspirin given need for Eliquis.  4.  Hypertension: Blood pressure is normal.  No changes to therapy.  5.  Chronic kidney disease stage III: Creatinine 2.1 on 03/28/2019.    COVID-19 Education: The signs and symptoms of COVID-19 were discussed with the patient and how to seek care for testing (follow up with PCP or arrange E-visit).  The importance of social distancing was discussed today.  Time:   Today, I have spent 15 minutes with the patient with telehealth technology discussing the above problems.     Medication Adjustments/Labs and Tests Ordered: Current medicines are reviewed at length with the patient today.  Concerns regarding medicines are outlined above.   Tests Ordered: No orders of the defined types were placed in this encounter.   Medication Changes: No orders of the defined types were placed in this encounter.   Follow Up:  Virtual Visit prn  Signed, Kate Sable, MD  05/08/2019 11:06 AM    [place]

## 2019-05-09 ENCOUNTER — Ambulatory Visit: Payer: Medicare HMO | Admitting: Cardiovascular Disease

## 2019-06-08 DEATH — deceased

## 2019-11-26 IMAGING — CR PORTABLE CHEST - 1 VIEW
1 series · 1 of 1 positions shown · non-contrast
Comparison: 11/29/2018

CLINICAL DATA: Shortness of breath with fluid retention

EXAM:
PORTABLE CHEST 1 VIEW

[portable]
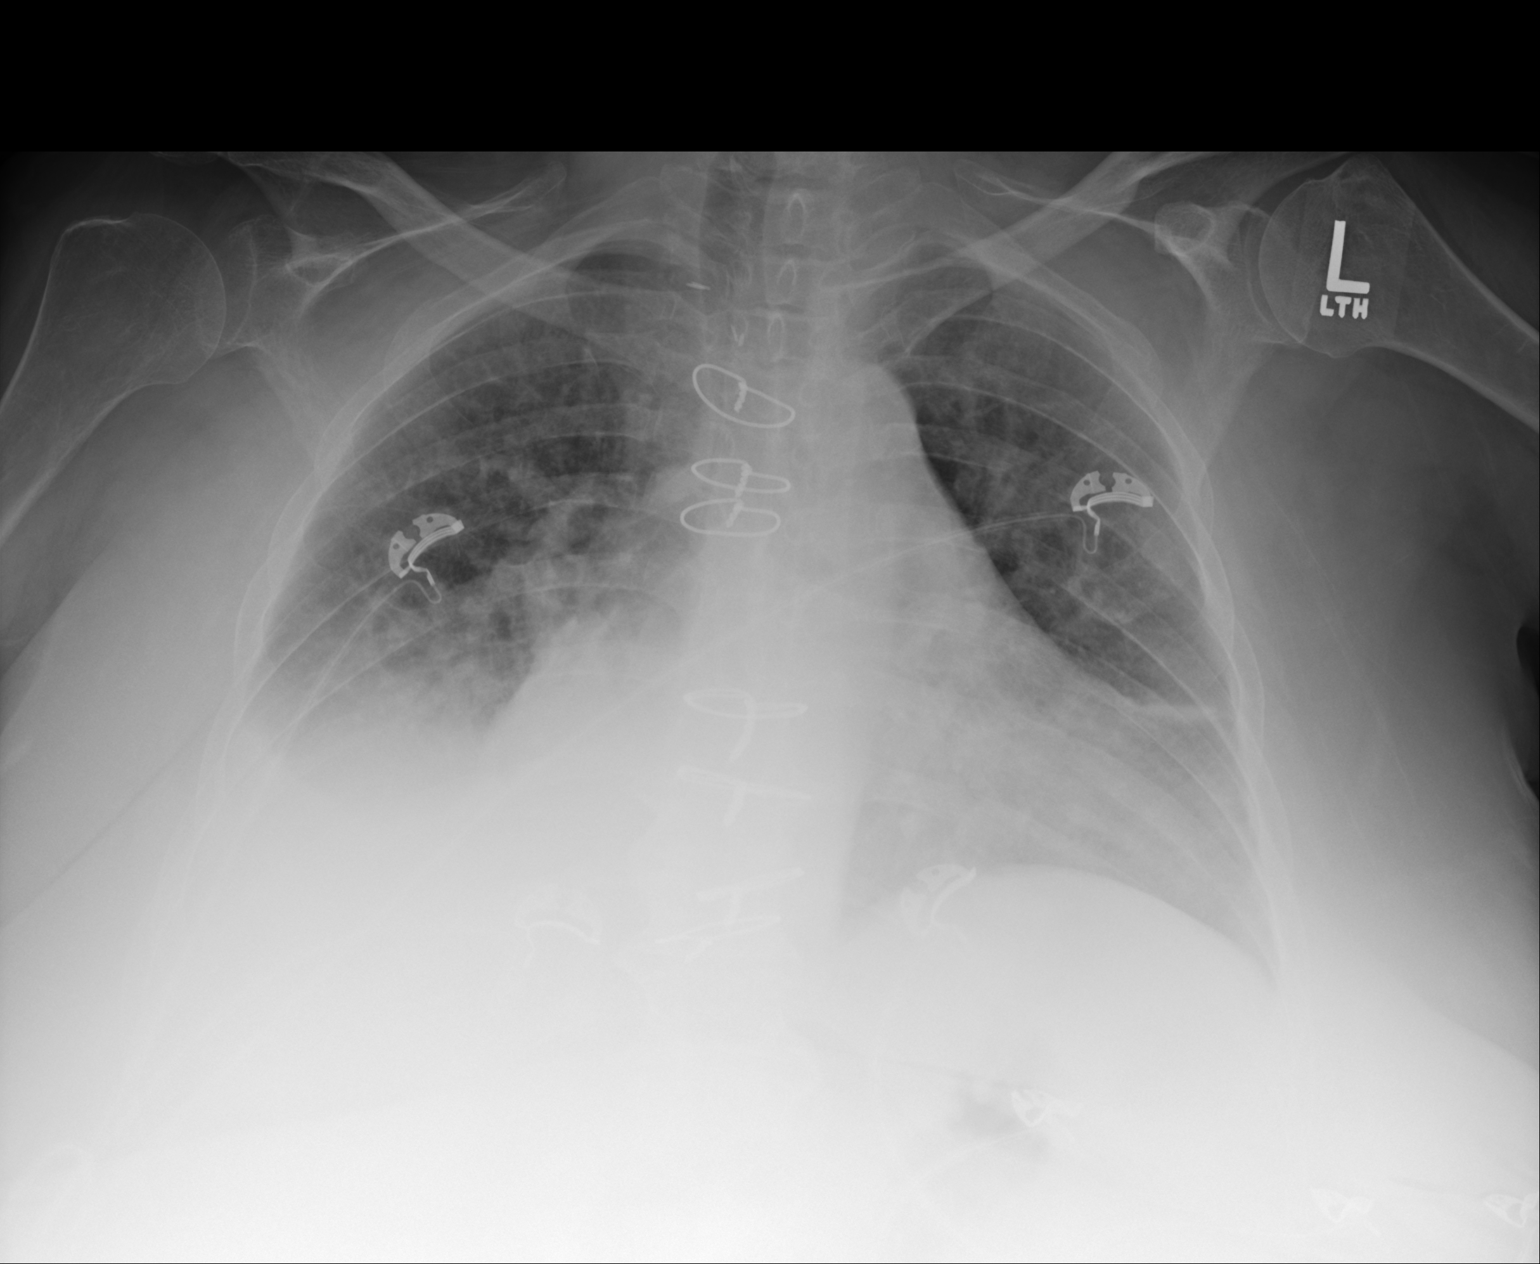

[1 of 1 positions shown; findings below may reference images not displayed]

FINDINGS: Cardiomegaly and vascular pedicle widening. Prior median sternotomy
for CABG

There is haziness of the lower right chest. Vascular congestion. The
left lung is relatively clear with only mild atelectasis or scarring
the lingula.
IMPRESSION: Cardiomegaly and vascular congestion with right pleural effusion.

## 2019-11-28 IMAGING — CR PORTABLE CHEST - 1 VIEW
1 series · 1 of 1 positions shown · non-contrast
Comparison: Yesterday

CLINICAL DATA: Pleural effusion follow-up

EXAM:
PORTABLE CHEST 1 VIEW

[portable]
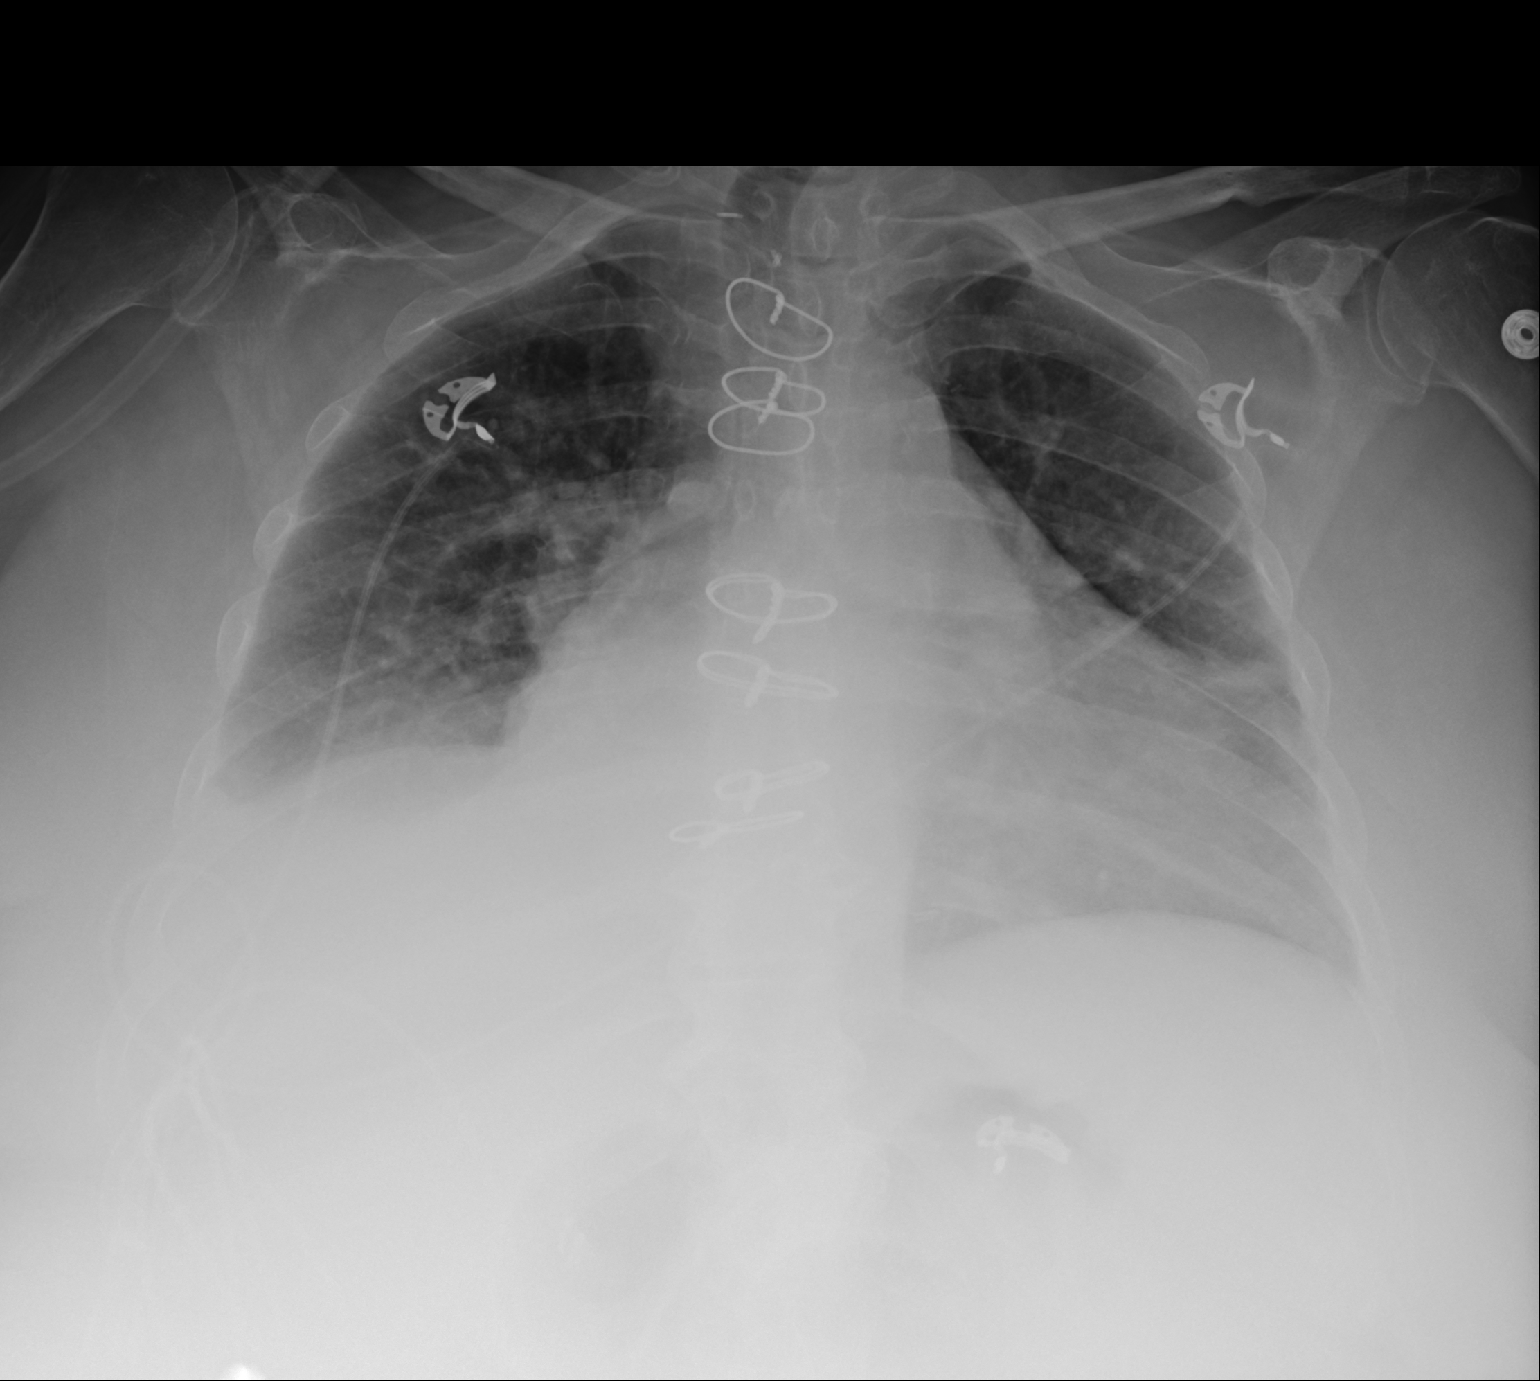

[1 of 1 positions shown; findings below may reference images not displayed]

FINDINGS: Cardiomegaly. Prior median sternotomy. Right pleural effusion
causing hazy density at the base. No pulmonary edema. Atelectasis or
scarring at the level of the lingula.
IMPRESSION: Stable cardiomegaly and small right pleural effusion.

## 2019-11-29 IMAGING — CR PORTABLE CHEST - 1 VIEW
1 series · 2 of 2 positions shown · non-contrast
Comparison: 01/03/2019

CLINICAL DATA: PICC line placementPost central line placement

EXAM:
PORTABLE CHEST 1 VIEW

[Series 1: portable · 0.17mm/px · 2 of 2 slices shown]
[im 1/2]
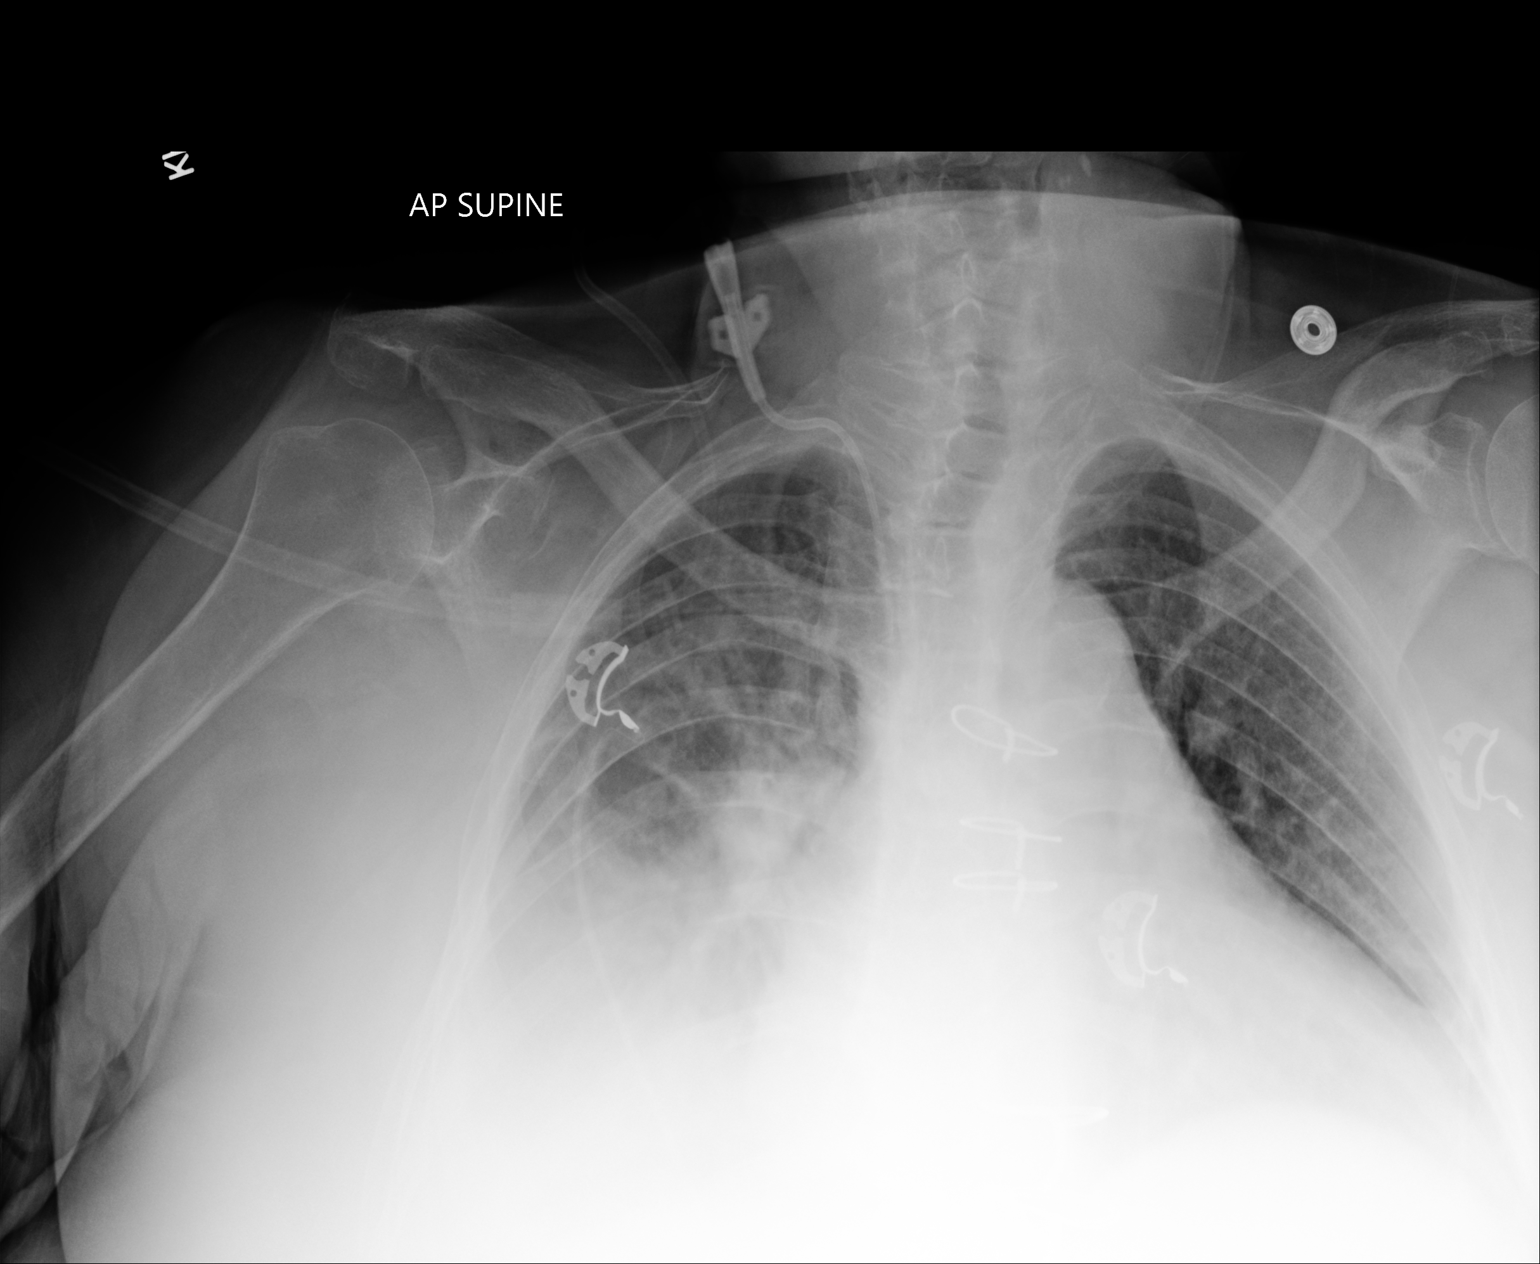
[im 2/2]
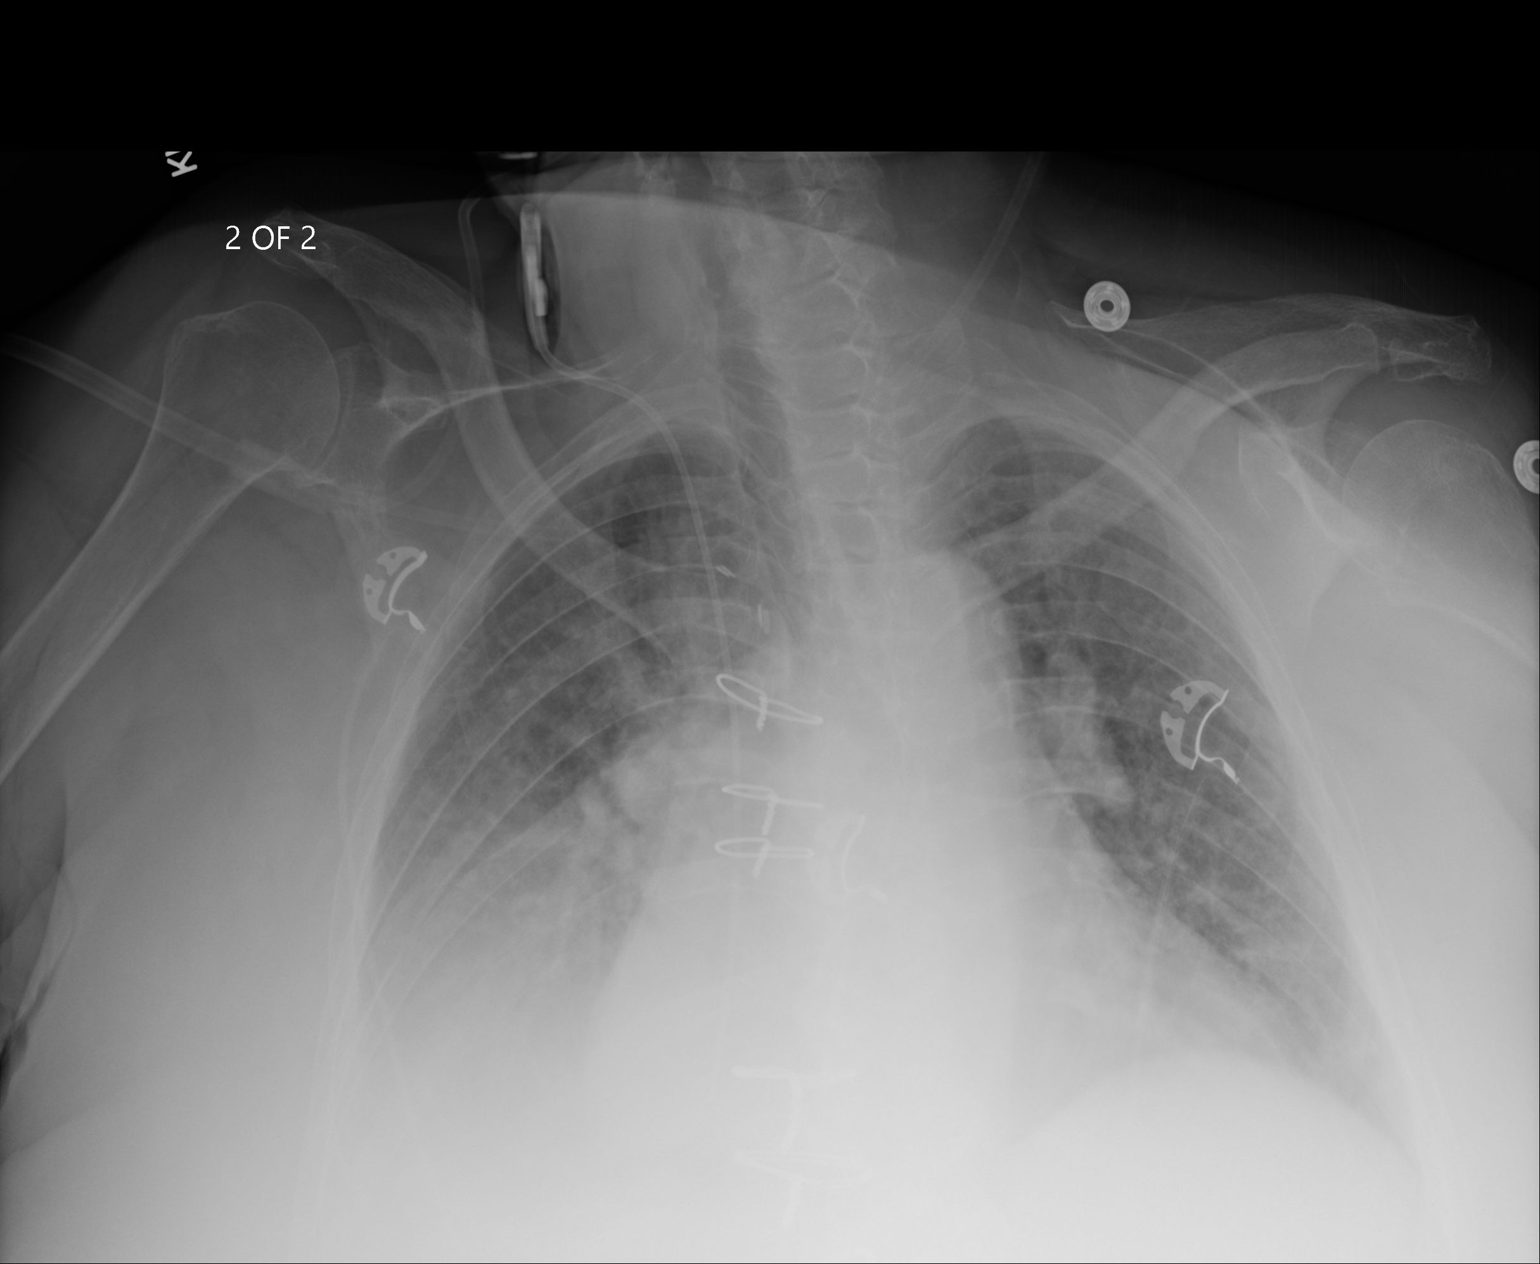

[2 of 2 positions shown; findings below may reference images not displayed]

FINDINGS: RIGHT IJ catheter with tip in the distal SVC. Normal cardiac
silhouette. Enlarged cardiac silhouette. No pneumothorax. Bilateral
moderate pleural effusions. Central venous congestion
IMPRESSION: 1. RIGHT central venous line in place without complication.
2. Moderate bilateral effusions and pulmonary venous congestion.

## 2019-12-01 IMAGING — CR PORTABLE CHEST - 1 VIEW
1 series · 1 of 1 positions shown · non-contrast
Comparison: 01/04/2019 and earlier.

CLINICAL DATA: 67-year-old female with acute on chronic combined
diastolic and systolic dysfunction, CHF exacerbation.

EXAM:
PORTABLE CHEST 1 VIEW

[portable]
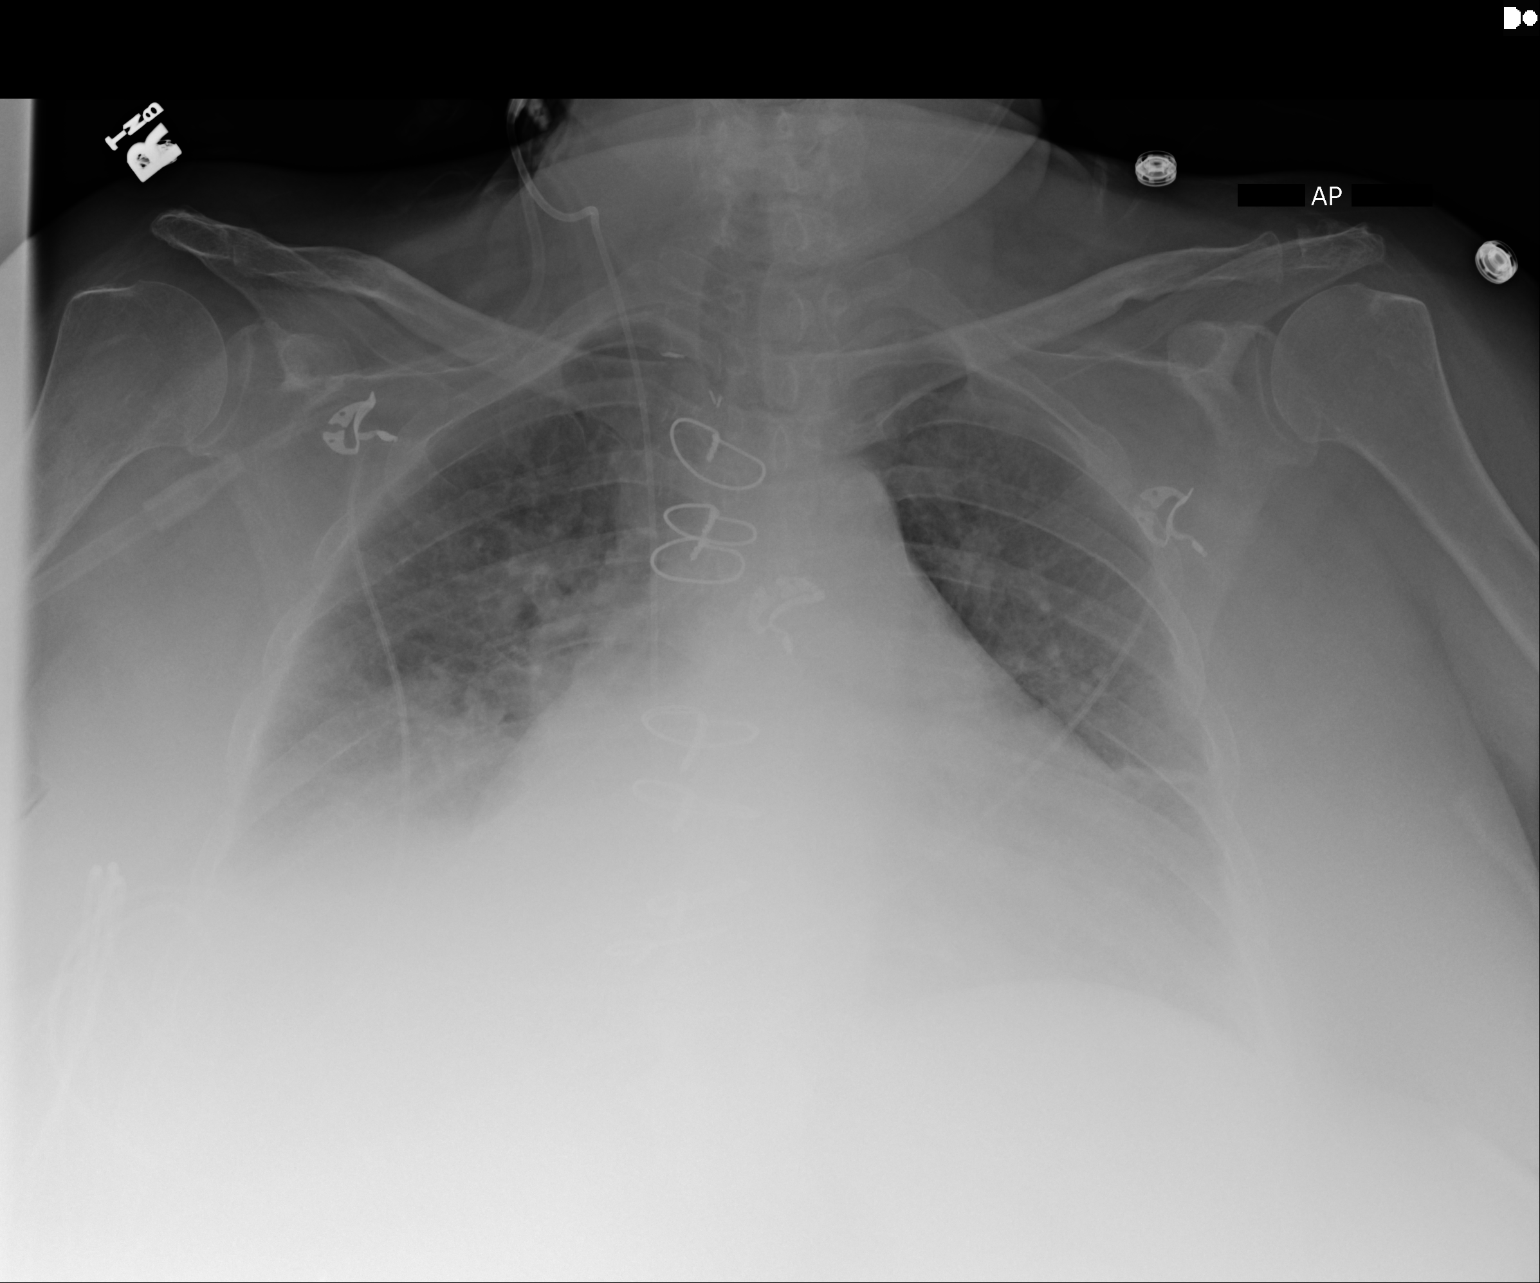

[1 of 1 positions shown; findings below may reference images not displayed]

FINDINGS: Portable AP upright view at 7252 hours. Stable right IJ central
line. Stable cardiomegaly and mediastinal contours. Continued
veiling opacity at the right lung base. No pneumothorax. Continued
pulmonary vascular congestion and/or mild interstitial edema. No
confluent opacity in the left lung. Paucity of bowel gas in the
upper abdomen.
IMPRESSION: Stable probable small right pleural effusion with vascular
congestion and/or mild interstitial edema

## 2019-12-31 ENCOUNTER — Encounter: Payer: Self-pay | Admitting: Gastroenterology
